# Patient Record
Sex: Female | Born: 1980 | Race: Black or African American | Hispanic: No | Marital: Single | Smoking: Never smoker
Health system: Southern US, Community
[De-identification: ages and names within clinical notes are randomized; demographics above are authoritative.]

## PROBLEM LIST (undated history)

## (undated) DIAGNOSIS — G5602 Carpal tunnel syndrome, left upper limb: Secondary | ICD-10-CM

## (undated) DIAGNOSIS — K219 Gastro-esophageal reflux disease without esophagitis: Secondary | ICD-10-CM

## (undated) DIAGNOSIS — R112 Nausea with vomiting, unspecified: Secondary | ICD-10-CM

## (undated) DIAGNOSIS — Z8261 Family history of arthritis: Secondary | ICD-10-CM

## (undated) DIAGNOSIS — H30139 Disseminated chorioretinal inflammation, generalized, unspecified eye: Secondary | ICD-10-CM

## (undated) DIAGNOSIS — T7840XA Allergy, unspecified, initial encounter: Secondary | ICD-10-CM

## (undated) DIAGNOSIS — G43909 Migraine, unspecified, not intractable, without status migrainosus: Secondary | ICD-10-CM

## (undated) DIAGNOSIS — Z9889 Other specified postprocedural states: Secondary | ICD-10-CM

## (undated) DIAGNOSIS — N946 Dysmenorrhea, unspecified: Secondary | ICD-10-CM

## (undated) DIAGNOSIS — H3500 Unspecified background retinopathy: Secondary | ICD-10-CM

## (undated) DIAGNOSIS — E559 Vitamin D deficiency, unspecified: Secondary | ICD-10-CM

## (undated) DIAGNOSIS — D8989 Other specified disorders involving the immune mechanism, not elsewhere classified: Secondary | ICD-10-CM

## (undated) HISTORY — DX: Allergy, unspecified, initial encounter: T78.40XA

## (undated) HISTORY — DX: Family history of arthritis: Z82.61

## (undated) HISTORY — DX: Other specified disorders involving the immune mechanism, not elsewhere classified: H35.00

## (undated) HISTORY — DX: Disseminated chorioretinal inflammation, generalized, unspecified eye: H30.139

## (undated) HISTORY — DX: Carpal tunnel syndrome, left upper limb: G56.02

## (undated) HISTORY — DX: Vitamin D deficiency, unspecified: E55.9

## (undated) HISTORY — DX: Other specified disorders involving the immune mechanism, not elsewhere classified: D89.89

## (undated) HISTORY — DX: Dysmenorrhea, unspecified: N94.6

---

## 2006-02-07 HISTORY — PX: TONSILLECTOMY: SUR1361

## 2011-01-21 ENCOUNTER — Encounter: Payer: Self-pay | Admitting: *Deleted

## 2011-01-21 ENCOUNTER — Emergency Department (HOSPITAL_COMMUNITY)
Admission: EM | Admit: 2011-01-21 | Discharge: 2011-01-21 | Disposition: A | Discharge status: Home / Self Care | Payer: Medicaid Other | Attending: Emergency Medicine | Admitting: Emergency Medicine

## 2011-01-21 DIAGNOSIS — H9209 Otalgia, unspecified ear: Secondary | ICD-10-CM | POA: Insufficient documentation

## 2011-01-21 DIAGNOSIS — M26629 Arthralgia of temporomandibular joint, unspecified side: Secondary | ICD-10-CM | POA: Insufficient documentation

## 2011-01-21 MED ORDER — IBUPROFEN 800 MG PO TABS: 800.0000 mg | ORAL_TABLET | Freq: Three times a day (TID) | ORAL | Status: AC

## 2011-01-21 MED ORDER — IBUPROFEN 800 MG PO TABS
800.0000 mg | ORAL_TABLET | Freq: Once | ORAL | Status: AC
  Administered 2011-01-21: 800 mg via ORAL
  Filled 2011-01-21: qty 1

## 2011-01-21 NOTE — ED Notes (Signed)
Onset one week ago left ear pain continued today 9/10 achy sharp able to hear without incident.

## 2011-01-21 NOTE — ED Provider Notes (Signed)
History     CSN: [id number] Arrival date & time: 01/21/2011  4:03 PM   First MD Initiated Contact with Patient 01/21/11 1610      Chief Complaint  Patient presents with  . Otalgia    (Consider location/radiation/quality/duration/timing/severity/associated sxs/prior treatment) HPI Comments: Patient presents complaining of one week of left ear pain.  No drainage.  No fevers.  No loss of hearing her initial injury or exposure to loud noises.  Patient does note that the pain increases with chewing.  It is also worse upon opening her jaw wide.  Patient has no sore throat and no difficulty swallowing.  She has not taken anything for her pain at this time.  She has no primary care physician in the area as of yet.  Patient is a 30 y.o. female presenting with ear pain. The history is provided by the patient. No language interpreter was used.  Otalgia This is a new problem. The current episode started more than 2 days ago. There is pain in the left ear. The problem occurs constantly. The problem has not changed since onset.There has been no fever. The pain is moderate. Pertinent negatives include no headaches, no abdominal pain, no diarrhea, no vomiting, no cough and no rash.    History reviewed. No pertinent past medical history.  History reviewed. No pertinent past surgical history.  History reviewed. No pertinent family history.  History  Substance Use Topics  . Smoking status: Never Smoker   . Smokeless tobacco: Not on file  . Alcohol Use: No    OB History    Grav Para Term Preterm Abortions TAB SAB Ect Mult Living                  Review of Systems  Constitutional: Negative.  Negative for fever and chills.  HENT: Positive for ear pain.   Eyes: Negative.  Negative for discharge and redness.  Respiratory: Negative.  Negative for cough and shortness of breath.   Cardiovascular: Negative.  Negative for chest pain.  Gastrointestinal: Negative.  Negative for nausea, vomiting,  abdominal pain and diarrhea.  Genitourinary: Negative.  Negative for dysuria and vaginal discharge.  Musculoskeletal: Negative.  Negative for back pain.  Skin: Negative.  Negative for color change and rash.  Neurological: Negative.  Negative for syncope and headaches.  Hematological: Negative.  Negative for adenopathy.  Psychiatric/Behavioral: Negative.  Negative for confusion.  All other systems reviewed and are negative.    Allergies  Review of patient's allergies indicates no known allergies.  Home Medications  No current outpatient prescriptions on file.  BP 131/79  Pulse 84  Temp(Src) 98.5 F (36.9 C) (Oral)  Resp 20  SpO2 98%  LMP 12/22/2010  Physical Exam  Constitutional: She is oriented to person, place, and time. She appears well-developed and well-nourished.  HENT:  Head: Normocephalic and atraumatic.  Left Ear: External ear normal.  Nose: Nose normal.  Mouth/Throat: Oropharynx is clear and moist. No oropharyngeal exudate.       Left TM is clear.  Left external ear canal is clear and without signs of injury or inflammation.  No swelling posterior to the ear.  No lymphadenopathy in her anterior posterior neck or pre-or postauricular regions.  Patient has a clear oropharynx.  Uvula is midline and there is no gum tenderness or dental pain on exam.  Patient's noted location of tenderness is over her left TMJ joint.  Eyes: Conjunctivae and EOM are normal. Pupils are equal, round, and reactive to light.  Neck: Normal range of motion. Neck supple.  Pulmonary/Chest: Effort normal.  Musculoskeletal: Normal range of motion.  Lymphadenopathy:    She has no cervical adenopathy.  Neurological: She is alert and oriented to person, place, and time.  Skin: Skin is warm and dry.  Psychiatric: She has a normal mood and affect. Her behavior is normal. Judgment and thought content normal.    ED Course  Procedures (including critical care time)  Labs Reviewed - No data to  display No results found.   No diagnosis found.    MDM  Patient with possible TMJ syndrome or inflammation.  Her ear shows no signs of otitis externa or otitis media.  Her evaluation of her gums and oral cavity does not show any signs of dental abscess or tonsillitis or pharyngitis.  No lymphadenopathy or swelling is noted in her neck.  No swelling behind her ear to indicate mastoiditis.  At this point, place the patient on anti-inflammatory medications and have her followup with an oral surgeon or a dentist for further evaluation if this is persistent.        Nat Christen, MD 01/21/11 [phone]

## 2011-01-21 NOTE — ED Notes (Signed)
Lt earache for one week no drainage no temp.  Her hearing is normal

## 2011-05-19 ENCOUNTER — Encounter (HOSPITAL_COMMUNITY): Payer: Self-pay | Admitting: Emergency Medicine

## 2011-05-19 ENCOUNTER — Emergency Department (HOSPITAL_COMMUNITY)
Admission: EM | Admit: 2011-05-19 | Discharge: 2011-05-20 | Disposition: A | Discharge status: Home / Self Care | Payer: Medicaid Other | Attending: Emergency Medicine | Admitting: Emergency Medicine

## 2011-05-19 DIAGNOSIS — L298 Other pruritus: Secondary | ICD-10-CM | POA: Insufficient documentation

## 2011-05-19 DIAGNOSIS — L299 Pruritus, unspecified: Secondary | ICD-10-CM

## 2011-05-19 DIAGNOSIS — L2989 Other pruritus: Secondary | ICD-10-CM | POA: Insufficient documentation

## 2011-05-19 NOTE — ED Notes (Signed)
Pt alert, nad, c/o itching all over, onset this evening, denies recent exposures, resp even unlabored, skin pwd, states took a benadryl pta

## 2011-05-20 MED ORDER — PREDNISONE 20 MG PO TABS
60.0000 mg | ORAL_TABLET | Freq: Once | ORAL | Status: AC
Start: 2011-05-20 — End: 2011-05-20
  Administered 2011-05-20: 60 mg via ORAL
  Filled 2011-05-20: qty 3

## 2011-05-20 MED ORDER — DIPHENHYDRAMINE HCL 25 MG PO TABS
25.0000 mg | ORAL_TABLET | Freq: Three times a day (TID) | ORAL | Status: DC | PRN
Start: 2011-05-20 — End: 2011-05-21

## 2011-05-20 MED ORDER — PREDNISONE 10 MG PO TABS: 20.0000 mg | ORAL_TABLET | Freq: Every day | ORAL | Status: DC

## 2011-05-20 MED ORDER — PREDNISONE 20 MG PO TABS: 60.0000 mg | ORAL_TABLET | Freq: Every day | ORAL | Status: AC

## 2011-05-20 MED ORDER — FAMOTIDINE 20 MG PO TABS: 20.0000 mg | ORAL_TABLET | Freq: Two times a day (BID) | ORAL | Status: DC

## 2011-05-20 MED ORDER — FAMOTIDINE 20 MG PO TABS
20.0000 mg | ORAL_TABLET | Freq: Once | ORAL | Status: AC
  Administered 2011-05-20: 20 mg via ORAL
  Filled 2011-05-20: qty 1

## 2011-05-20 NOTE — Discharge Instructions (Signed)
FOLLOW UP WITH YOUR CHOICE OF DERMATOLOGIST FOR PERSISTENT ITCHING OR RETURN HERE WITH ANY WORSENING SYMPTOMS OR NEW CONCERN.  Pruritus  Pruritis is an itch. There are many different problems that can cause an itch. Dry skin is one of the most common causes of itching. Most cases of itching do not require medical attention.  HOME CARE INSTRUCTIONS  Make sure your skin is moistened on a regular basis. A moisturizer that contains petroleum jelly is best for keeping moisture in your skin. If you develop a rash, you may try the following for relief:   Use corticosteroid cream.   Apply cool compresses to the affected areas.   Bathe with Epsom salts or baking soda in the bathwater.   Soak in colloidal oatmeal baths. These are available at your pharmacy.   Apply baking soda paste to the rash. Stir water into baking soda until it reaches a paste-like consistency.   Use an anti-itch lotion.   Take over-the-counter diphenhydramine medicine by mouth as the instructions direct.   Avoid scratching. Scratching may cause the rash to become infected. If itching is very bad, your caregiver may suggest prescription lotions or creams to lessen your symptoms.   Avoid hot showers, which can make itching worse. A cold shower may help with itching as long as you use a moisturizer after the shower.  SEEK MEDICAL CARE IF: The itching does not go away after several days. Document Released: 10/06/2010 Document Revised: 01/13/2011 Document Reviewed: 10/06/2010 [facility] Patient Information 2012 [place], [place].

## 2011-05-20 NOTE — ED Provider Notes (Signed)
History     CSN: [id number]  Arrival date & time 05/19/11  2240   First MD Initiated Contact with Patient 05/20/11 0116      Chief Complaint  Patient presents with  . Pruritis    (Consider location/radiation/quality/duration/timing/severity/associated sxs/prior treatment) Patient is a 31 y.o. female presenting with rash. The history is provided by the patient.  Rash  This is a new problem. The current episode started 3 to 5 hours ago. The problem has been rapidly improving. The problem is associated with nothing. There has been no fever. Associated symptoms comments: Generalized itching without rash, discoloration or swelling that started approximately 5 hours ago. No new soaps, foods, lotions, etc. She took Benadryl and symptoms started to subside about 3 hours later. No history of the same. . She has tried antihistamines for the symptoms. The treatment provided mild relief.    History reviewed. No pertinent past medical history.  History reviewed. No pertinent past surgical history.  No family history on file.  History  Substance Use Topics  . Smoking status: Never Smoker   . Smokeless tobacco: Not on file  . Alcohol Use: No    OB History    Grav Para Term Preterm Abortions TAB SAB Ect Mult Living                  Review of Systems  Constitutional: Negative for fever and chills.  Respiratory: Negative.   Skin: Positive for rash.    Allergies  Review of patient's allergies indicates no known allergies.  Home Medications  No current outpatient prescriptions on file.  BP 127/78  Pulse 89  Temp 98 F (36.7 C)  Resp 16  Wt 225 lb (102.059 kg)  SpO2 97%  LMP 04/18/2011  Physical Exam  Constitutional: She appears well-developed and well-nourished.  HENT:  Head: Normocephalic.  Mouth/Throat: Oropharynx is clear and moist.  Neck: Normal range of motion. Neck supple.  Pulmonary/Chest: Effort normal and breath sounds normal. She has no wheezes. She has no  rales.  Musculoskeletal: Normal range of motion.  Neurological: She is alert. No cranial nerve deficit.  Skin: Skin is warm and dry. No rash noted.       No rash, discoloration, lesion, or swelling to skin generally.   Psychiatric: She has a normal mood and affect.    ED Course  Procedures (including critical care time)  Labs Reviewed - No data to display No results found.   No diagnosis found. 1. Pruritis    MDM          Rodena Medin, PA-C 05/20/11 [phone]

## 2011-05-21 ENCOUNTER — Emergency Department (HOSPITAL_COMMUNITY)
Admission: EM | Admit: 2011-05-21 | Discharge: 2011-05-21 | Disposition: A | Discharge status: Home / Self Care | Payer: Medicaid Other | Source: Home / Self Care

## 2011-05-21 ENCOUNTER — Encounter (HOSPITAL_COMMUNITY): Payer: Self-pay | Admitting: Emergency Medicine

## 2011-05-21 DIAGNOSIS — K219 Gastro-esophageal reflux disease without esophagitis: Secondary | ICD-10-CM

## 2011-05-21 LAB — POCT URINALYSIS DIP (DEVICE)
Bilirubin Urine: NEGATIVE
Hgb urine dipstick: NEGATIVE
Ketones, ur: NEGATIVE mg/dL
Protein, ur: NEGATIVE mg/dL
pH: 6 (ref 5.0–8.0)

## 2011-05-21 MED ORDER — ONDANSETRON 4 MG PO TBDP: 4.0000 mg | ORAL_TABLET | Freq: Three times a day (TID) | ORAL | Status: AC | PRN

## 2011-05-21 MED ORDER — OMEPRAZOLE 20 MG PO CPDR: 20.0000 mg | DELAYED_RELEASE_CAPSULE | Freq: Every day | ORAL | Status: DC

## 2011-05-21 NOTE — ED Provider Notes (Signed)
Medical screening examination/treatment/procedure(s) were performed by non-physician practitioner and as supervising physician I was immediately available for consultation/collaboration.  Sunnie Nielsen, MD 05/21/11 [phone]

## 2011-05-21 NOTE — ED Provider Notes (Signed)
History     CSN: [id number]  Arrival date & time 05/21/11  1722   None     Chief Complaint  Patient presents with  . Nausea    (Consider location/radiation/quality/duration/timing/severity/associated sxs/prior treatment) HPI Comments: Patient presents today with complaints of nausea for 2 weeks. She denies abdominal pain, vomiting or diarrhea. She states that she has awoken a couple of times feeling like "stuff is in my throat, but nothing comes up." She has no previous history of heartburn symptoms or diagnosis of reflux. Patient was seen in the ED 2 days ago for hives, and dizziness that began after eating [place] food. She was diagnosed as having an allergic reaction and began on Pepcid and prednisone. She states that the itching and hives has resolved. She continues to have some dizziness with changing positions. She also feels that the nausea has worsened since she began the medication as prescribed by the ER.   History reviewed. No pertinent past medical history.  Past Surgical History  Procedure Date  . Tonsillectomy     History reviewed. No pertinent family history.  History  Substance Use Topics  . Smoking status: Never Smoker   . Smokeless tobacco: Not on file  . Alcohol Use: Yes    OB History    Grav Para Term Preterm Abortions TAB SAB Ect Mult Living                  Review of Systems  Constitutional: Negative for fever, chills, appetite change and fatigue.  HENT: Negative for ear pain, congestion, sore throat, rhinorrhea and sneezing.   Respiratory: Negative for cough, shortness of breath and wheezing.   Cardiovascular: Negative for chest pain and palpitations.  Gastrointestinal: Positive for nausea. Negative for vomiting, abdominal pain, diarrhea, constipation and blood in stool.  Genitourinary: Negative for dysuria, frequency, hematuria and flank pain.    Allergies  Review of patient's allergies indicates no known allergies.  Home Medications    Current Outpatient Rx  Name Route Sig Dispense Refill  . FAMOTIDINE 20 MG PO TABS Oral Take 1 tablet (20 mg total) by mouth 2 (two) times daily. 6 tablet 0  . OMEPRAZOLE 20 MG PO CPDR Oral Take 1 capsule (20 mg total) by mouth daily. 14 capsule 0  . ONDANSETRON 4 MG PO TBDP Oral Take 1 tablet (4 mg total) by mouth every 8 (eight) hours as needed for nausea. 12 tablet 0  . PREDNISONE 20 MG PO TABS Oral Take 3 tablets (60 mg total) by mouth daily. 9 tablet 0    BP 134/88  Pulse 65  Temp(Src) 98 F (36.7 C) (Oral)  Resp 16  SpO2 100%  LMP 04/18/2011  Physical Exam  Constitutional: She appears well-developed and well-nourished. No distress.  HENT:  Head: Normocephalic and atraumatic.  Right Ear: Tympanic membrane, external ear and ear canal normal.  Left Ear: Tympanic membrane, external ear and ear canal normal.  Nose: Nose normal.  Mouth/Throat: Uvula is midline, oropharynx is clear and moist and mucous membranes are normal. No oropharyngeal exudate, posterior oropharyngeal edema or posterior oropharyngeal erythema.  Eyes: Conjunctivae, EOM and lids are normal. Pupils are equal, round, and reactive to light.  Fundoscopic exam:      The right eye shows no hemorrhage and no papilledema.       The left eye shows no hemorrhage and no papilledema.  Neck: Neck supple.  Cardiovascular: Normal rate, regular rhythm and normal heart sounds.   Pulmonary/Chest: Effort normal and breath  sounds normal. No respiratory distress.  Abdominal: Soft. Normal appearance and bowel sounds are normal. She exhibits no mass. There is no hepatosplenomegaly. There is no tenderness. There is no CVA tenderness.  Lymphadenopathy:    She has no cervical adenopathy.  Neurological: She is alert. She has normal strength. No cranial nerve deficit. Gait normal.  Reflex Scores:      Bicep reflexes are 2+ on the right side and 2+ on the left side. Skin: Skin is warm and dry.  Psychiatric: She has a normal mood and  affect.    ED Course  Procedures (including critical care time)   Labs Reviewed  POCT URINALYSIS DIP (DEVICE)  POCT PREGNANCY, URINE  LAB REPORT - SCANNED   No results found.   1. GERD (gastroesophageal reflux disease)       MDM  Nausea x 2 weeks with nocturnal reflux symptoms. No abd pain, V/D. Recent allergic rxn improved.         Melody Comas, [place] 05/22/11 [phone]

## 2011-05-21 NOTE — ED Notes (Addendum)
Nausea for 2 weeks.  Reports burning, tingling sensation, itching onset this past Thursday. Generalized all over body.  Reports feeling off balance on Thursday.  Patient seen at [facility] long ed.  Currently itching has improved

## 2011-05-21 NOTE — Discharge Instructions (Signed)
Finish your last dose of Pepcid then begin taking Omeprazole. Increase fluids - drink 6-8 glasses of water a day. Follow up with your primary care dr next week if symptoms persist.   Gastroesophageal Reflux Disease, Adult Gastroesophageal reflux disease (GERD) happens when acid from your stomach goes into your food pipe (esophagus). The acid can cause a burning feeling in your chest. Over time, the acid can make small holes (ulcers) in your food pipe.  HOME CARE  Ask your doctor for advice about:   Losing weight.   Quitting smoking.   Alcohol use.   Avoid foods and drinks that make your problems worse. You may want to avoid:   Caffeine and alcohol.   Chocolate.   Mints.   Garlic and onions.   Spicy foods.   Citrus fruits, such as oranges, lemons, or limes.   Foods that contain tomato, such as sauce, chili, salsa, and pizza.   Fried and fatty foods.   Avoid lying down for 3 hours before you go to bed or before you take a nap.   Eat small meals often, instead of large meals.   Wear loose-fitting clothing. Do not wear anything tight around your waist.   Raise (elevate) the head of your bed 6 to 8 inches with wood blocks. Using extra pillows does not help.   Only take medicines as told by your doctor.   Do not take aspirin or ibuprofen.  GET HELP RIGHT AWAY IF:   You have pain in your arms, neck, jaw, teeth, or back.   Your pain gets worse or changes.   You feel sick to your stomach (nauseous), throw up (vomit), or sweat (diaphoresis).   You feel short of breath, or you pass out (faint).   Your throw up is green, yellow, black, or looks like coffee grounds or blood.   Your poop (stool) is red, bloody, or black.  MAKE SURE YOU:   Understand these instructions.   Will watch your condition.   Will get help right away if you are not doing well or get worse.  Document Released: 07/13/2007 Document Revised: 01/13/2011 Document Reviewed: 08/13/2010 [facility]  Patient Information 2012 [place], [place].

## 2011-05-23 NOTE — ED Provider Notes (Signed)
Medical screening examination/treatment/procedure(s) were performed by non-physician practitioner and as supervising physician I was immediately available for consultation/collaboration.  Leslee Home, M.D.   Reuben Likes, MD 05/23/11 [phone]

## 2011-08-05 DIAGNOSIS — K219 Gastro-esophageal reflux disease without esophagitis: Secondary | ICD-10-CM | POA: Insufficient documentation

## 2011-08-05 DIAGNOSIS — IMO0002 Reserved for concepts with insufficient information to code with codable children: Secondary | ICD-10-CM | POA: Insufficient documentation

## 2011-08-05 DIAGNOSIS — N946 Dysmenorrhea, unspecified: Secondary | ICD-10-CM | POA: Insufficient documentation

## 2011-09-05 DIAGNOSIS — E559 Vitamin D deficiency, unspecified: Secondary | ICD-10-CM | POA: Insufficient documentation

## 2011-10-25 DIAGNOSIS — A6000 Herpesviral infection of urogenital system, unspecified: Secondary | ICD-10-CM | POA: Insufficient documentation

## 2012-06-15 ENCOUNTER — Other Ambulatory Visit: Payer: Self-pay

## 2012-06-15 ENCOUNTER — Encounter (HOSPITAL_COMMUNITY): Payer: Self-pay | Admitting: Emergency Medicine

## 2012-06-15 ENCOUNTER — Emergency Department (HOSPITAL_COMMUNITY)
Admission: EM | Admit: 2012-06-15 | Discharge: 2012-06-15 | Disposition: A | Discharge status: Home / Self Care | Payer: 59 | Attending: Emergency Medicine | Admitting: Emergency Medicine

## 2012-06-15 DIAGNOSIS — R0789 Other chest pain: Secondary | ICD-10-CM

## 2012-06-15 DIAGNOSIS — G252 Other specified forms of tremor: Secondary | ICD-10-CM

## 2012-06-15 DIAGNOSIS — N390 Urinary tract infection, site not specified: Secondary | ICD-10-CM

## 2012-06-15 DIAGNOSIS — R51 Headache: Secondary | ICD-10-CM | POA: Insufficient documentation

## 2012-06-15 DIAGNOSIS — Z3202 Encounter for pregnancy test, result negative: Secondary | ICD-10-CM | POA: Insufficient documentation

## 2012-06-15 LAB — URINE MICROSCOPIC-ADD ON

## 2012-06-15 LAB — COMPREHENSIVE METABOLIC PANEL
Alkaline Phosphatase: 49 U/L (ref 39–117)
BUN: 14 mg/dL (ref 6–23)
Calcium: 8.9 mg/dL (ref 8.4–10.5)
Creatinine, Ser: 0.79 mg/dL (ref 0.50–1.10)
GFR calc Af Amer: >90 mL/min (ref >90)
Glucose, Bld: 102 mg/dL — ABNORMAL HIGH (ref 70–99)
Total Protein: 7.5 g/dL (ref 6.0–8.3)

## 2012-06-15 LAB — URINALYSIS, ROUTINE W REFLEX MICROSCOPIC
Ketones, ur: NEGATIVE mg/dL
Nitrite: NEGATIVE
Specific Gravity, Urine: 1.025 (ref 1.005–1.030)
pH: 6.5 (ref 5.0–8.0)

## 2012-06-15 LAB — CBC WITH DIFFERENTIAL/PLATELET
Eosinophils Absolute: 0.5 10*3/uL (ref 0.0–0.7)
Eosinophils Relative: 5 % (ref 0–5)
Hemoglobin: 12.9 g/dL (ref 12.0–15.0)
Lymphs Abs: 2.5 10*3/uL (ref 0.7–4.0)
MCH: 26 pg (ref 26.0–34.0)
MCHC: 33.5 g/dL (ref 30.0–36.0)
MCV: 77.6 fL — ABNORMAL LOW (ref 78.0–100.0)
Monocytes Absolute: 0.4 10*3/uL (ref 0.1–1.0)
Monocytes Relative: 4 % (ref 3–12)
RBC: 4.96 MIL/uL (ref 3.87–5.11)

## 2012-06-15 LAB — POCT PREGNANCY, URINE: Preg Test, Ur: NEGATIVE

## 2012-06-15 MED ORDER — DIPHENHYDRAMINE HCL 50 MG/ML IJ SOLN
25.0000 mg | Freq: Once | INTRAMUSCULAR | Status: AC
  Administered 2012-06-15: 25 mg via INTRAVENOUS
  Filled 2012-06-15: qty 1

## 2012-06-15 MED ORDER — SODIUM CHLORIDE 0.9 % IV BOLUS (SEPSIS)
1000.0000 mL | Freq: Once | INTRAVENOUS | Status: AC
  Administered 2012-06-15: 1000 mL via INTRAVENOUS

## 2012-06-15 MED ORDER — METOCLOPRAMIDE HCL 5 MG/ML IJ SOLN
10.0000 mg | Freq: Once | INTRAMUSCULAR | Status: AC
  Administered 2012-06-15: 10 mg via INTRAVENOUS
  Filled 2012-06-15: qty 2

## 2012-06-15 MED ORDER — DEXTROSE 5 % IV SOLN
1.0000 g | Freq: Once | INTRAVENOUS | Status: AC
  Administered 2012-06-15: 1 g via INTRAVENOUS
  Filled 2012-06-15: qty 10

## 2012-06-15 MED ORDER — HYDROCODONE-ACETAMINOPHEN 5-325 MG PO TABS: 1.0000 | ORAL_TABLET | ORAL | Status: DC | PRN

## 2012-06-15 MED ORDER — CEPHALEXIN 500 MG PO CAPS
500.0000 mg | ORAL_CAPSULE | Freq: Two times a day (BID) | ORAL | Status: DC
Start: 2012-06-15 — End: 2012-11-04

## 2012-06-15 MED ORDER — KETOROLAC TROMETHAMINE 30 MG/ML IJ SOLN
30.0000 mg | Freq: Once | INTRAMUSCULAR | Status: AC
  Administered 2012-06-15: 30 mg via INTRAVENOUS
  Filled 2012-06-15: qty 1

## 2012-06-15 NOTE — ED Notes (Signed)
Cp and trembling since 1115 this am left side cp  Some sob

## 2012-06-15 NOTE — ED Notes (Addendum)
Pt states she has had intermittent periods of whole body shaking since 2009. Pt states that she has seen several doctors for it, none can diagnose it. Pt states she is also having CP since this AM. PT c/o sob, nausea, denies vomiting. Denies diaphoresis. Pt states cp is sharp, starts in the center of chest "and jumps around."

## 2012-06-15 NOTE — ED Provider Notes (Signed)
History     CSN: [id number]  Arrival date & time 06/15/12  1411   First MD Initiated Contact with Patient 06/15/12 1516      Chief Complaint  Patient presents with  . Shaking  . Chest Pain    (Consider location/radiation/quality/duration/timing/severity/associated sxs/prior treatment) HPI  32 year old female presents emergency PERRLA chief complaint of chest pain and trembling.  Patient states that this has been going on on and off for the past 5 years since 2009.  She states that her first occurrence at happened after taking Claritin.  Patient states that last week she began having a headache and a spasming in her hands.  She also had an episode where her right side true of and became spastic.  She is unable to move that side and states she felt like she was "having a stroke."  The patient has been seen by neurology previously was ruled out for stroke or complex seizure.  The patient does state today that she has a headache which she describes as unilateral and throbbing.  She does have associated nausea.  She denies any auras or vomiting.  She rates her headache as a 9/10.  He did not take anything to relieve the pain.  She also has some associated sharp intermittent chest pain that is substernal and radiates to the left side.  Lasting seconds at a time.  She denies any radiation into the left arm or jaw, diaphoresis, chest pressure.  Does have some shortness of breath when the pain comes on.  She is a nonsmoker and other than obesity has no other risk factors for acute coronary syndrome.  Jill Alexanders denies any calf pain, leg swelling, exertion as estrogens, recent surgeries or confinement or hemoptysis. Denies unilateral weakness, facial asymmetry, difficulty with speech, change in gait, or vertigo. Denies photophobia, phonophobia,visual changes, stiff neck, neck pain, rash, or "thunderclap" onset of headache.     History reviewed. No pertinent past medical history.  Past Surgical History   Procedure Laterality Date  . Tonsillectomy      No family history on file.  History  Substance Use Topics  . Smoking status: Never Smoker   . Smokeless tobacco: Not on file  . Alcohol Use: Yes    OB History   Grav Para Term Preterm Abortions TAB SAB Ect Mult Living                  Review of Systems  Ten systems reviewed and are negative for acute change, except as noted in the HPI.   Allergies  Claritin  Home Medications  No current outpatient prescriptions on file.  BP 142/88  Pulse 102  Resp 16  SpO2 99%  Physical Exam  Physical Exam  Nursing note and vitals reviewed. Constitutional: She is oriented to person, place, and time. She appears well-developed and well-nourished. No distress.  HENT:  Head: Normocephalic and atraumatic.  Eyes: Conjunctivae normal and EOM are normal. Pupils are equal, round, and reactive to light. No scleral icterus.  Neck: Normal range of motion.  Cardiovascular: Normal rate, regular rhythm and normal heart sounds.  Exam reveals no gallop and no friction rub.   No murmur heard. Pulmonary/Chest: Effort normal and breath sounds normal. No respiratory distress.  Abdominal: Soft. Bowel sounds are normal. She exhibits no distension and no mass. There is no tenderness. There is no guarding.  Neurological:Tremulous. She is alert and oriented to person, place, and time. Speech is clear and goal oriented, follows commands. Major Cranial  nerves without deficit, no facial droop Normal strength in upper and lower extremities bilaterally including dorsiflexion and plantar flexion, strong and equal grip strength Sensation normal to light and sharp touch Moves extremities without ataxia, coordination intact Normal finger to nose and rapid alternating movements Neg romberg, no pronator drift Normal gait Normal heel-shin and balance Skin: Skin is warm and dry. She is not diaphoretic.     ED Course  Procedures (including critical care  time)  Labs Reviewed  CBC WITH DIFFERENTIAL - Abnormal; Notable for the following:    WBC 10.7 (*)    MCV 77.6 (*)    All other components within normal limits  COMPREHENSIVE METABOLIC PANEL - Abnormal; Notable for the following:    Glucose, Bld 102 (*)    Total Bilirubin 0.2 (*)    All other components within normal limits  URINALYSIS, ROUTINE W REFLEX MICROSCOPIC - Abnormal; Notable for the following:    APPearance TURBID (*)    Hgb urine dipstick LARGE (*)    Protein, ur 30 (*)    Leukocytes, UA SMALL (*)    All other components within normal limits  URINE MICROSCOPIC-ADD ON - Abnormal; Notable for the following:    Squamous Epithelial / LPF FEW (*)    Bacteria, UA MANY (*)    All other components within normal limits  URINE CULTURE  POCT I-STAT TROPONIN I  POCT PREGNANCY, URINE   No results found.  Date: 06/15/2012  Rate:99  Rhythm: normal sinus rhythm  QRS Axis: normal  Intervals: normal  ST/T Wave abnormalities: normal  Conduction Disutrbances: none  Narrative Interpretation:   Old EKG Reviewed:     1. Headache   2. UTI (lower urinary tract infection)   3. Coarse tremors   4. Atypical chest pain       MDM  Patient with atypical chest pain.   I suspect the patient may be having Atypical migraine considering her symptoms and the fact that her tremors always occur with headache which is UL and throbbing. I Have ordered a migraine cocktail. Labwork ordered at triage is pending.   4:55 PM EKG unremarkable. Leukocytosis and hyperglycemia likely acute phase reaction due to pain. Troponin negative  Patient headache is resolved. She does appear to have a urinary tract infection. I will treat here with rocephin. Sending urine for culture. I will d/c patient home and follow up with PCP/ Neurology. Pt HA treated and improved while in ED.  Presentation is like pts typical HA and non concerning for [facility], ICH, Meningitis, or temporal arteritis. Pt is afebrile with no  focal neuro deficits, nuchal rigidity, or change in vision. Pt is to follow up with PCP to discuss prophylactic medication. Pt verbalizes understanding and is agreeable with plan to dc.          Arthor Captain, PA-C 06/15/12 1718

## 2012-06-15 NOTE — ED Notes (Signed)
PA at bedside.

## 2012-06-16 LAB — URINE CULTURE: Colony Count: 55000

## 2012-06-16 NOTE — ED Provider Notes (Signed)
Medical screening examination/treatment/procedure(s) were performed by non-physician practitioner and as supervising physician I was immediately available for consultation/collaboration.   Benny Lennert, MD 06/16/12 1248

## 2012-11-04 ENCOUNTER — Encounter (HOSPITAL_COMMUNITY): Payer: Self-pay | Admitting: Emergency Medicine

## 2012-11-04 ENCOUNTER — Emergency Department (HOSPITAL_COMMUNITY)
Admission: EM | Admit: 2012-11-04 | Discharge: 2012-11-04 | Disposition: A | Discharge status: Home / Self Care | Payer: 59 | Attending: Emergency Medicine | Admitting: Emergency Medicine

## 2012-11-04 DIAGNOSIS — R5383 Other fatigue: Secondary | ICD-10-CM | POA: Insufficient documentation

## 2012-11-04 DIAGNOSIS — M542 Cervicalgia: Secondary | ICD-10-CM | POA: Insufficient documentation

## 2012-11-04 DIAGNOSIS — R51 Headache: Secondary | ICD-10-CM | POA: Insufficient documentation

## 2012-11-04 DIAGNOSIS — R259 Unspecified abnormal involuntary movements: Secondary | ICD-10-CM | POA: Insufficient documentation

## 2012-11-04 DIAGNOSIS — Z79899 Other long term (current) drug therapy: Secondary | ICD-10-CM | POA: Insufficient documentation

## 2012-11-04 DIAGNOSIS — R519 Headache, unspecified: Secondary | ICD-10-CM

## 2012-11-04 DIAGNOSIS — R5381 Other malaise: Secondary | ICD-10-CM | POA: Insufficient documentation

## 2012-11-04 MED ORDER — SODIUM CHLORIDE 0.9 % IV BOLUS (SEPSIS)
1000.0000 mL | Freq: Once | INTRAVENOUS | Status: AC
  Administered 2012-11-04: 1000 mL via INTRAVENOUS

## 2012-11-04 MED ORDER — DIPHENHYDRAMINE HCL 50 MG/ML IJ SOLN
25.0000 mg | Freq: Once | INTRAMUSCULAR | Status: AC
  Administered 2012-11-04: 25 mg via INTRAVENOUS
  Filled 2012-11-04: qty 1

## 2012-11-04 MED ORDER — PROCHLORPERAZINE EDISYLATE 5 MG/ML IJ SOLN
10.0000 mg | Freq: Four times a day (QID) | INTRAMUSCULAR | Status: DC | PRN
  Filled 2012-11-04: qty 2

## 2012-11-04 MED ORDER — METOCLOPRAMIDE HCL 5 MG/ML IJ SOLN
10.0000 mg | Freq: Once | INTRAMUSCULAR | Status: AC
  Administered 2012-11-04: 10 mg via INTRAVENOUS
  Filled 2012-11-04: qty 2

## 2012-11-04 MED ORDER — DEXAMETHASONE SODIUM PHOSPHATE 10 MG/ML IJ SOLN
10.0000 mg | Freq: Once | INTRAMUSCULAR | Status: AC
  Administered 2012-11-04: 10 mg via INTRAVENOUS
  Filled 2012-11-04: qty 1

## 2012-11-04 NOTE — ED Notes (Signed)
[facility] after having sudden onset of sharp pain and weakness on right side of body about 2pm today. No deficits noted via EMS. CBG 88.

## 2012-11-04 NOTE — ED Notes (Signed)
Pt taken to restroom by [employer]

## 2012-11-04 NOTE — ED Provider Notes (Signed)
CSN: [id number]     Arrival date & time 11/04/12  1450 History   First MD Initiated Contact with Patient 11/04/12 1532     Chief Complaint  Patient presents with  . Pain   (Consider location/radiation/quality/duration/timing/severity/associated sxs/prior Treatment) HPI Comments: 32 y/o female with no significant PMH presenting with headache. She reports prior to HA, she experienced sharp shooting pain from her neck down the right side of her body and became weak in her right arm and leg. HA gradual onset, throbbing, located on the top of her head. Also reports stiffness and tremors on the right side of her body. No recent infectious symptoms, chest pain, SOB. No vision changes, facial droop, difficulty speaking/swallowing. She reports that she has had right sided weakness in the past and had an MRI last week to evaluate. She is scheduled to f/u with her PCP tomorrow for results of the MRI.   The history is provided by the patient. No language interpreter was used.    History reviewed. No pertinent past medical history. Past Surgical History  Procedure Laterality Date  . Tonsillectomy     History reviewed. No pertinent family history. History  Substance Use Topics  . Smoking status: Never Smoker   . Smokeless tobacco: Not on file  . Alcohol Use: Yes   OB History   Grav Para Term Preterm Abortions TAB SAB Ect Mult Living                 Review of Systems  Constitutional: Negative for fever and appetite change.  HENT: Negative for hearing loss, ear pain, neck pain, neck stiffness and sinus pressure.   Eyes: Negative for photophobia and visual disturbance.  Respiratory: Negative for chest tightness and shortness of breath.   Cardiovascular: Negative for chest pain and palpitations.  Gastrointestinal: Negative for nausea and vomiting.  Musculoskeletal: Negative for gait problem.  Neurological: Positive for tremors, weakness and headaches. Negative for seizures, facial asymmetry,  speech difficulty and numbness.  All other systems reviewed and are negative.    Allergies  Claritin  Home Medications   Current Outpatient Rx  Name  Route  Sig  Dispense  Refill  . Cholecalciferol (VITAMIN D PO)   Oral   Take 1 tablet by mouth daily.         . Cyanocobalamin (B-12 PO)   Oral   Take 1 tablet by mouth daily.         . IRON PO   Oral   Take 1 tablet by mouth daily.          BP 134/85  Pulse 87  Temp(Src) 99.8 F (37.7 C) (Oral)  Ht 5\' 6"  (1.676 m)  Wt 236 lb (107.049 kg)  BMI 38.11 kg/m2  SpO2 100% Physical Exam  Vitals reviewed. Constitutional: She is oriented to person, place, and time. She appears well-developed and well-nourished. No distress.  HENT:  Head: Atraumatic.  Mouth/Throat: Oropharynx is clear and moist.  Eyes: EOM are normal. Pupils are equal, round, and reactive to light.  Neck: Normal range of motion. Neck supple.  Cardiovascular: Normal rate, regular rhythm, normal heart sounds and intact distal pulses.   Pulmonary/Chest: Effort normal and breath sounds normal.  Abdominal: Soft. Bowel sounds are normal. She exhibits no distension. There is no tenderness.  Neurological: She is alert and oriented to person, place, and time. She has normal reflexes. She displays tremor (right sided). No cranial nerve deficit or sensory deficit. She exhibits abnormal muscle tone (increased tone  of RUE and RLE). GCS eye subscore is 4. GCS verbal subscore is 5. GCS motor subscore is 6.  Strength 4/5 in right upper and lower extremity. 5/5 in left UE and LE. No truncal ataxia. No pronator drift.     ED Course  Procedures (including critical care time) Labs Review Labs Reviewed - No data to display Imaging Review No results found.  MDM   1. Headache    32 y/o female with no significant PMH presenting with headache and right sided weakness. Onset 2 pm. Reports prior episodes of similar symptoms for which she had an MRI last week and is to get  the results tomorrow at [facility]. On exam, she had increased tone of the RUE/RLE with 4/5 strength. Code Stroke not called as she has had similar symptoms in the past that were found to not be ischemic events. Following a migraine cocktail, strength and tone returned to baseline. No residual neurological deficits. HA resolved. In my differential I considered CVA, TIA, carotid dissection, partial seizure. Suspect complex migraine syndrome. As symptoms have now resolved and she has appropriate follow up, plan for discharge. Patient will keep scheduled appointment tomorrow. Return precautions discussed and patient voiced understanding of instructions.   Patient discussed with my attending, Dr. Jeraldine Loots.    Abagail Kitchens, MD 11/05/12 1410

## 2012-11-07 ENCOUNTER — Emergency Department (HOSPITAL_COMMUNITY)
Admission: EM | Admit: 2012-11-07 | Discharge: 2012-11-07 | Disposition: A | Discharge status: Home / Self Care | Payer: 59 | Attending: Emergency Medicine | Admitting: Emergency Medicine

## 2012-11-07 ENCOUNTER — Encounter (HOSPITAL_COMMUNITY): Payer: Self-pay | Admitting: Emergency Medicine

## 2012-11-07 DIAGNOSIS — R51 Headache: Secondary | ICD-10-CM | POA: Insufficient documentation

## 2012-11-07 DIAGNOSIS — H539 Unspecified visual disturbance: Secondary | ICD-10-CM | POA: Insufficient documentation

## 2012-11-07 DIAGNOSIS — Z79899 Other long term (current) drug therapy: Secondary | ICD-10-CM | POA: Insufficient documentation

## 2012-11-07 DIAGNOSIS — Z8679 Personal history of other diseases of the circulatory system: Secondary | ICD-10-CM | POA: Insufficient documentation

## 2012-11-07 DIAGNOSIS — H5710 Ocular pain, unspecified eye: Secondary | ICD-10-CM | POA: Insufficient documentation

## 2012-11-07 DIAGNOSIS — R42 Dizziness and giddiness: Secondary | ICD-10-CM | POA: Insufficient documentation

## 2012-11-07 DIAGNOSIS — R519 Headache, unspecified: Secondary | ICD-10-CM

## 2012-11-07 DIAGNOSIS — H5711 Ocular pain, right eye: Secondary | ICD-10-CM

## 2012-11-07 HISTORY — DX: Migraine, unspecified, not intractable, without status migrainosus: G43.909

## 2012-11-07 MED ORDER — METHYLPREDNISOLONE ACETATE 80 MG/ML IJ SUSP
80.0000 mg | Freq: Once | INTRAMUSCULAR | Status: AC
  Administered 2012-11-07: 80 mg via INTRAMUSCULAR
  Filled 2012-11-07: qty 1

## 2012-11-07 MED ORDER — BETAMETHASONE SOD PHOS & ACET 6 (3-3) MG/ML IJ SUSP: 12.0000 mg | Freq: Once | INTRAMUSCULAR | Status: DC

## 2012-11-07 MED ORDER — ACETAMINOPHEN 500 MG PO TABS
1000.0000 mg | ORAL_TABLET | Freq: Once | ORAL | Status: AC
  Administered 2012-11-07: 1000 mg via ORAL
  Filled 2012-11-07: qty 2

## 2012-11-07 MED ORDER — ONDANSETRON 4 MG PO TBDP
4.0000 mg | ORAL_TABLET | Freq: Once | ORAL | Status: AC
  Administered 2012-11-07: 4 mg via ORAL
  Filled 2012-11-07: qty 1

## 2012-11-07 NOTE — ED Notes (Signed)
Pt reports she was seen here Sunday with a headache and brief episode of Right side paralysis, pt was discharged home with a diagnoses of complex migraine. Pt followed up with her PCP on Monday and received an injection for pain control, pt reports she received the "migraine cocktail" hereon Sunday. Pt is waiting to see a neurologist and was instructed by her PCP to come here until she was able to do so. Pt is now c/o Right eye pain and a headache to the middle of her head, pt denies any paralysis since Sunday. Pt MAE with no difficulties, a/o x4, no facial droop or arm drift.

## 2012-11-07 NOTE — ED Provider Notes (Signed)
This patient was evaluated in conjunction with Dr. Ladona Ridgel, the resident physician.  I have seen the relevant studies, including ecg if performed.  The documentation is accurate and reflects this presentation.  Initially, the patient has headache, right-sided dysesthesia.  Notably the patient has had MRI recently, provisional diagnosis of complex migraine seems accurate.  Patient improved substantially here, and has followup scheduled for tomorrow, which seems appropriate.  Gerhard Munch, MD 11/07/12 [id number]

## 2012-11-07 NOTE — ED Notes (Signed)
Per terrance RN pt was discharged, instructions explained & patient will follow up. Teach back method used.

## 2012-11-07 NOTE — ED Provider Notes (Signed)
CSN: [id number]     Arrival date & time 11/07/12  1003 History   First MD Initiated Contact with Patient 11/07/12 1137     Chief Complaint  Patient presents with  . Eye Pain  . Headache   (Consider location/radiation/quality/duration/timing/severity/associated sxs/prior Treatment) Patient is a 32 y.o. female presenting with eye pain and headaches.  Eye Pain Associated symptoms include headaches. Pertinent negatives include no abdominal pain, arthralgias, chest pain, chills, fever, myalgias, nausea, rash or vomiting.  Headache Associated symptoms: eye pain   Associated symptoms: no abdominal pain, no diarrhea, no dizziness, no fever, no myalgias, no nausea and no vomiting     Dawn [name] is a 32 year old female with a past history of headache and visual deficits presents to the emergency department complaint.  The patient was seen here 3 days ago.  She reports that she turned her neck and had sudden onset of severe sharp electrical pain shooting from the back of her neck up to the center of her head and around the side of her head to her I associated with numbness and tingling which went down into the arm and caused weakness.. patient had an MRI done 2 weeks ago for the same problem which was negative.  Patient continues to have dull central headache, pain in the right eye, and blurry vision.  She was given a migraine cocktail and her last ED visit which did not relieve her symptoms at all.  The patient has a history of strokes in her grandparents she states that her grandfather had strokes beginning at an early age.  Patient's risk factors for stroke include family history and obesity. Past Medical History  Diagnosis Date  . Migraine    Past Surgical History  Procedure Laterality Date  . Tonsillectomy     No family history on file. History  Substance Use Topics  . Smoking status: Never Smoker   . Smokeless tobacco: Not on file  . Alcohol Use: Yes   OB History   Grav Para Term  Preterm Abortions TAB SAB Ect Mult Living                 Review of Systems  Constitutional: Negative for fever and chills.  HENT: Negative for trouble swallowing.   Eyes: Positive for pain and visual disturbance.  Respiratory: Negative for shortness of breath.   Cardiovascular: Negative for chest pain.  Gastrointestinal: Negative for nausea, vomiting, abdominal pain, diarrhea and constipation.  Genitourinary: Negative for dysuria and hematuria.  Musculoskeletal: Negative for myalgias and arthralgias.  Skin: Negative for rash.  Neurological: Positive for light-headedness and headaches. Negative for dizziness, syncope and speech difficulty.  All other systems reviewed and are negative.    Allergies  Claritin  Home Medications   Current Outpatient Rx  Name  Route  Sig  Dispense  Refill  . Cholecalciferol (VITAMIN D PO)   Oral   Take 1 tablet by mouth daily.         . Cyanocobalamin (B-12 PO)   Oral   Take 1 tablet by mouth daily.         . IRON PO   Oral   Take 1 tablet by mouth daily.          BP 120/74  Pulse 82  Temp(Src) 98.2 F (36.8 C)  Resp 16  SpO2 97% Physical Exam  Constitutional: She is oriented to person, place, and time. She appears well-developed and well-nourished. No distress.  HENT:  Head: Normocephalic and atraumatic.  Eyes: Conjunctivae are normal. No scleral icterus.  Neck: Normal range of motion.  Cardiovascular: Normal rate, regular rhythm and normal heart sounds.  Exam reveals no gallop and no friction rub.   No murmur heard. Pulmonary/Chest: Effort normal and breath sounds normal. No respiratory distress.  Abdominal: Soft. Bowel sounds are normal. She exhibits no distension and no mass. There is no tenderness. There is no guarding.  Neurological: She is alert and oriented to person, place, and time.  Patient is exquisitely tender to palpation at the left ear and greater occipital notch.  Palpation of the greater occipital nerve  causes shooting pain to the central scalp.  She says this is his same symptoms that began her headaches.  Speech is clear and goal oriented, follows commands Major Cranial nerves without deficit, no facial droop Normal strength in upper and lower extremities bilaterally including dorsiflexion and plantar flexion, strong and equal grip strength Sensation normal to light and sharp touch Moves extremities without ataxia, coordination intact Normal finger to nose and rapid alternating movements Neg romberg, no pronator drift Normal gait Normal heel-shin and balance   Skin: Skin is warm and dry. She is not diaphoretic.    ED Course  NERVE BLOCK Date/Time: 11/07/2012 3:12 PM Performed by: Arthor Captain Authorized by: Arthor Captain Consent: Verbal consent obtained. Risks and benefits: risks, benefits and alternatives were discussed Consent given by: patient Patient identity confirmed: verbally with patient Indications: pain relief Body area: head Nerve: greater occipital Laterality: right Patient sedated: no Patient position: sitting Needle gauge: 25 G Location technique: anatomical landmarks Local anesthetic: bupivacaine 0.5% with epinephrine (methyl prednisolone 20mg  (.25ml)) Anesthetic total: 0.25 ml Patient tolerance: Patient tolerated the procedure well with no immediate complications.  NERVE BLOCK Date/Time: 11/07/2012 3:14 PM Performed by: Arthor Captain Authorized by: Arthor Captain Consent: Verbal consent obtained. Consent given by: patient Patient identity confirmed: verbally with patient Indications: pain relief Body area: head Nerve: lesser occipital Laterality: right Patient sedated: no Patient position: sitting Needle gauge: 25 G Location technique: anatomical landmarks Local anesthetic: bupivacaine 0.25% with epinephrine (methyl prednisolone 20mg  (.25ml)) Anesthetic total: 0.25 ml Outcome: pain improved Patient tolerance: Patient tolerated the  procedure well with no immediate complications.   (including critical care time) Labs Review Labs Reviewed - No data to display Imaging Review No results found.  MDM   1. Headache   2. Eye pain, right    Patient with headache.  Visual disturbance.  She has had MRI which was negative for any acute abnormalities.  Her objective neurologic exam shows no abnormality.  I will try occipital nerve block to see if she has any relief of her symptoms.  paitent headache and eye pain resolved with nerve block and tylenol. She will follow up shortly with neuro. The patient appears reasonably screened and/or stabilized for discharge and I doubt any other medical condition or other [facility] requiring further screening, evaluation, or treatment in the ED at this time prior to discharge.   Arthor Captain, PA-C 11/11/12 2243

## 2012-11-07 NOTE — ED Notes (Signed)
Was seen on Sunday and dx w/ migrain and yesterday had a problem w/ rt eye blurry vision , still has h/a was given shot on Monday at dr office no new injury to head

## 2012-11-08 ENCOUNTER — Encounter: Payer: Self-pay | Admitting: Neurology

## 2012-11-08 ENCOUNTER — Ambulatory Visit (INDEPENDENT_AMBULATORY_CARE_PROVIDER_SITE_OTHER): Payer: 59 | Admitting: Neurology

## 2012-11-08 VITALS — BP 125/76 | HR 94 | Ht 66.0 in | Wt 229.0 lb

## 2012-11-08 DIAGNOSIS — G43109 Migraine with aura, not intractable, without status migrainosus: Secondary | ICD-10-CM

## 2012-11-08 DIAGNOSIS — R519 Headache, unspecified: Secondary | ICD-10-CM

## 2012-11-08 DIAGNOSIS — Z823 Family history of stroke: Secondary | ICD-10-CM

## 2012-11-08 DIAGNOSIS — Z862 Personal history of diseases of the blood and blood-forming organs and certain disorders involving the immune mechanism: Secondary | ICD-10-CM

## 2012-11-08 DIAGNOSIS — Z8249 Family history of ischemic heart disease and other diseases of the circulatory system: Secondary | ICD-10-CM

## 2012-11-08 DIAGNOSIS — R51 Headache: Secondary | ICD-10-CM

## 2012-11-08 MED ORDER — TOPIRAMATE 25 MG PO TABS: ORAL_TABLET | ORAL | Status: DC

## 2012-11-08 NOTE — Progress Notes (Signed)
Subjective:    Patient ID: Dawn [name] is a 32 y.o. female.  HPI  Huston Foley, MD, PhD [facility] [address] [address] [place], [place] [address]  Dear Dr. Lorenso Courier,  I saw your patient, Dawn [name], upon your kind request in my neurologic clinic today for initial consultation of her recurrent headaches. The patient is accompanied by her sister, Dawn [name], today. As you know, Dawn [name] is a very pleasant 32 year old right-handed woman with an underlying medical history of obesity, allergies and insomnia and prior diagnosis of migraine headaches who has been to the emergency room recently on several occasions with HA and complications, including eye pain and R sided tingling. She was seen on 11/07/2012 for sudden onset of headache and eye pain. She was seen 3 days prior to that as well. She recently had a noncontrast brain MRI on 10/24/2012 and I reviewed the test results. There was fluid within the left mastoid air cells representing an inflammatory periauricular process. She had a mucous retention cyst within the left maxillary sinus. No acute sinonasal disease was seen. Otherwise she acute findings.  On 11/07/2012 she was treated in the emergency room with occipital nerve block which helped per hospital record, but patient states it did not help. Prior to that she was treated with a migraine cocktail which did not help. She has seen a neurologist in the past for uncontrolable tremors, when she was still residing in [place]. She has a milder HA on the top of her head currently, describing it as a constant ache. She has mild nausea, no photophobia.  In saw a migraine specialist in 5/14 at [facility] and had an MRI brain, which was normal. She was treated with propranolol, which did not help and Inderal LA, which caused SEs.   Her Past Medical History Is Significant For: Past Medical History  Diagnosis Date  . Migraine    Her Past Surgical History Is Significant  For: Past Surgical History  Procedure Laterality Date  . Tonsillectomy     Her Family History Is Significant For: No family history on file.  Her Social History Is Significant For: History   Social History  . Marital Status: Single    Spouse Name: N/A    Number of Children: 1  . Years of Education: 12   Occupational History  . PROVIDER [employer]   Social History Main Topics  . Smoking status: Never Smoker   . Smokeless tobacco: Never Used  . Alcohol Use: No  . Drug Use: No  . Sexual Activity: None   Other Topics Concern  . None   Social History Narrative  . None    Her Allergies Are:  Allergies  Allergen Reactions  . Claritin [Loratadine] Other (See Comments)    Tremors  :   Her Current Medications Are:  Outpatient Encounter Prescriptions as of 11/08/2012  Medication Sig Dispense Refill  . Cholecalciferol (VITAMIN D PO) Take 1 tablet by mouth daily.      . Cyanocobalamin (B-12 PO) Take 1 tablet by mouth daily.      . IRON PO Take 1 tablet by mouth daily.      . Multiple Vitamins-Minerals (MULTIVITAMIN WITH MINERALS) tablet 1 tablet. Take 1 tablet by mouth daily.       No facility-administered encounter medications on file as of 11/08/2012.   Review of Systems:  Out of a complete 14 point review of systems, all are reviewed and negative with the exception  of these symptoms as listed below:  Review of Systems  Constitutional: Positive for fever.  HENT:       Ringing ears  Eyes:       Eye pain Double vision Loss of vision Blurred vision  Cardiovascular: Positive for chest pain.  Endocrine:       Feeling cold  Musculoskeletal:       Joint pain Cramps Aching muscles  Skin:       Birth marks  moles  Neurological: Positive for dizziness, weakness, numbness and headaches.  Hematological:       Anemia    Objective:  Neurologic Exam  Physical Exam Physical Examination:   Filed Vitals:   11/08/12 1330  BP: 125/76  Pulse: 94     General Examination: The patient is a very pleasant 32 y.o. female in no acute distress. She appears well-developed and well-nourished and well groomed. She is obese.   HEENT: Normocephalic, atraumatic, pupils are equal, round and reactive to light and accommodation. Funduscopic exam is normal with sharp disc margins noted. Extraocular tracking is good without limitation to gaze excursion or nystagmus noted. Normal smooth pursuit is noted. Hearing is grossly intact. Tympanic membranes are clear bilaterally. Face is symmetric with normal facial animation and normal facial sensation. Speech is clear with no dysarthria noted. There is no hypophonia. There is no lip, neck/head, jaw or voice tremor. Neck is supple with full range of passive and active motion. There are no carotid bruits on auscultation. Oropharynx exam reveals: mild mouth dryness, adequate dental hygiene and moderate airway crowding, due to larger tongue. Mallampati is class I. Tongue protrudes centrally and palate elevates symmetrically. Tonsils are 1+.   Chest: Clear to auscultation without wheezing, rhonchi or crackles noted.  Heart: S1+S2+0, regular and normal without murmurs, rubs or gallops noted.   Abdomen: Soft, non-tender and non-distended with normal bowel sounds appreciated on auscultation.  Extremities: There is no pitting edema in the distal lower extremities bilaterally. Pedal pulses are intact.  Skin: Warm and dry without trophic changes noted. There are no varicose veins.  Musculoskeletal: exam reveals no obvious joint deformities, tenderness or joint swelling or erythema.   Neurologically:  Mental status: The patient is awake, alert and oriented in all 4 spheres. Her memory, attention, language and knowledge are appropriate. There is no aphasia, agnosia, apraxia or anomia. Speech is clear with normal prosody and enunciation. Thought process is linear. Mood is congruent and affect is normal.  Cranial nerves are as  described above under HEENT exam. In addition, shoulder shrug is normal with equal shoulder height noted. Motor exam: Normal bulk, strength and tone is noted. There is no drift, tremor or rebound. Romberg is negative. Reflexes are 2+ throughout. Toes are downgoing bilaterally. Fine motor skills are intact with normal finger taps, normal hand movements, normal rapid alternating patting, normal foot taps and normal foot agility.  Cerebellar testing shows no dysmetria or intention tremor on finger to nose testing. Heel to shin is unremarkable bilaterally. There is no truncal or gait ataxia.  Sensory exam is intact to light touch, pinprick, vibration, temperature sense and proprioception in the upper and lower extremities.  Gait, station and balance are unremarkable. No veering to one side is noted. No leaning to one side is noted. Posture is age-appropriate and stance is narrow based. No problems turning are noted. She turns en bloc. Tandem walk is unremarkable. Intact toe and heel stance is noted.  Assessment and Plan:  In summary, Dawn [name] is a very pleasant 32 y.o.-year old female with an underlying medical history of headaches, whose history and exam are in keeping with a diagnosis of complicated migraine of several year duration. Her physical exam is non-focal. She is doing fairly well at this time and I reassured the patient in that regard. In particular, I did not see any papilledema. I had a long chat with the patient and her sister about my findings and the diagnosis of migraine with neurologic complications, the prognosis and treatment options. We talked about medical treatments and non-pharmacological approaches. We talked about smoking cessation and maintaining a healthy lifestyle in general. I encouraged the patient to eat healthy, exercise daily and keep well hydrated, to keep a scheduled bedtime and wake time routine, to not skip any meals and eat healthy snacks in between meals  and to have protein with every meal.   I advised the patient about common headache triggers: sleep deprivation, dehydration, overheating, stress, hypoglycemia or skipping meals and blood sugar fluctuations, excessive pain medications or excessive alcohol use or caffeine withdrawal. Some people have food triggers such as aged cheese, orange juice or chocolate, especially dark chocolate, or MSG (monosodium glutamate). She is to try to avoid these headache triggers as much possible. It may be helpful to keep a headache diary to figure out what makes Her headaches worse or brings them on and what alleviates them. Some people report headache onset after exercise but studies have shown that regular exercise may actually prevent headaches from coming. If She has exercise-induced headaches, She is advised to drink plenty of fluid before and after exercising and that to not overdo it and to not overheat. 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 She used to see a specialist in the [place] area and may need to establish care for this diagnosis with a pulmonologist. She is advised that I would hold off on spinal fluid testing at this moment based on no MRI evidence of white matter lesions and absence of papilledema in the context of a nonfocal exam.  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 Common side effects reported are: Sedation, sleepiness, tingling, change in taste especially with carbonated drinks, and rare side effects are glaucoma, kidney stones, and problems with thinking  including word finding difficulties. I advised the patient that the medication can lower the efficacy of hormonal birth control.  I answered all their questions today and the patient and her sister were in agreement with the above outlined plan. I would like to see the patient back in 3 months, sooner if the need arises and encouraged her to call with any interim questions, concerns, problems or updates and refill requests and test results.   Thank you very much for allowing me to participate in the care of this nice patient. If I can be of any further assistance to you please do not hesitate to call me at [phone].  Sincerely,   Huston Foley, MD, PhD

## 2012-11-08 NOTE — Patient Instructions (Addendum)
Please remember, common headache triggers are: sleep deprivation, dehydration, overheating, stress, hypoglycemia or skipping meals and blood sugar fluctuations, excessive pain medications or excessive alcohol use or caffeine withdrawal. Some people have food triggers such as aged cheese, orange juice or chocolate, especially dark chocolate, or MSG (monosodium glutamate). Try to avoid these headache triggers as much possible. It may be helpful to keep a headache diary to figure out what makes your headaches worse or brings them on and what alleviates them. Some people report headache onset after exercise but studies have shown that regular exercise may actually prevent headaches from coming. If you have exercise-induced headaches, please make sure that you drink plenty of fluid before and after exercising and that you do not over do it and do not overheat.  We will do a brain MRA and blood work, we will try Topamax 50 mg: Take half a pill each bedtime for one week, then one pill at bedtime daily for one week, then 1-1/2 pills at bedtime daily for one week, then 2 pills at bedtime daily thereafter. Common side effects reported are: Sedation, sleepiness, tingling, change in taste especially with carbonated drinks, and rare side effects are glaucoma, kidney stones, and problems with thinking including word finding difficulties.  I want you to see your ophthalmologist for your vision. Please call us with the name of your ophthalmologist.

## 2012-11-09 LAB — COMPREHENSIVE METABOLIC PANEL
ALT: 21 IU/L (ref 0–32)
AST: 19 IU/L (ref 0–40)
Albumin: 4.4 g/dL (ref 3.5–5.5)
Alkaline Phosphatase: 46 IU/L (ref 39–117)
BUN/Creatinine Ratio: 13 (ref 8–20)
BUN: 11 mg/dL (ref 6–20)
CO2: 23 mmol/L (ref 18–29)
Chloride: 100 mmol/L (ref 97–108)
GFR calc Af Amer: 108 mL/min/{1.73_m2} (ref >59)
Potassium: 3.6 mmol/L (ref 3.5–5.2)
Sodium: 140 mmol/L (ref 134–144)
Total Bilirubin: 0.4 mg/dL (ref 0.0–1.2)

## 2012-11-09 LAB — CBC WITH DIFFERENTIAL
Basophils Absolute: 0 10*3/uL (ref 0.0–0.2)
Eosinophils Absolute: 0.2 10*3/uL (ref 0.0–0.4)
Immature Granulocytes: 0 %
Lymphocytes Absolute: 3 10*3/uL (ref 0.7–3.1)
Lymphs: 29 %
MCHC: 32.7 g/dL (ref 31.5–35.7)
MCV: 80 fL (ref 79–97)
Monocytes: 5 %
Neutrophils Absolute: 6.3 10*3/uL (ref 1.4–7.0)
Platelets: 347 10*3/uL (ref 150–379)
RDW: 13.8 % (ref 12.3–15.4)
WBC: 10 10*3/uL (ref 3.4–10.8)

## 2012-11-09 LAB — ANA W/REFLEX: Anti Nuclear Antibody(ANA): NEGATIVE

## 2012-11-09 LAB — RPR: RPR: NONREACTIVE

## 2012-11-09 LAB — B12 AND FOLATE PANEL: Vitamin B-12: 274 pg/mL (ref 211–946)

## 2012-11-09 LAB — SEDIMENTATION RATE: Sed Rate: 22 mm/hr (ref 0–32)

## 2012-11-19 ENCOUNTER — Telehealth: Payer: Self-pay | Admitting: *Deleted

## 2012-11-19 NOTE — ED Provider Notes (Signed)
Medical screening examination/treatment/procedure(s) were performed by non-physician practitioner and as supervising physician I was immediately available for consultation/collaboration.  Carolie Mcilrath, MD 11/19/12 0850 

## 2012-11-19 NOTE — Telephone Encounter (Signed)
I meant to order an MRA head, not MRI brain, would that be covered?

## 2012-11-21 ENCOUNTER — Other Ambulatory Visit: Payer: 59

## 2012-11-23 ENCOUNTER — Encounter: Payer: Self-pay | Admitting: *Deleted

## 2012-11-23 NOTE — Progress Notes (Signed)
Quick Note:  Please call and advise the patient that the recent labs we checked were within normal limits. We checked vitamin B12 level, cell count, blood sugar, electrolytes, kidney function, liver function, thyroid function, inflammatory markers, sedimentation rate. No further action is required on these tests at this time. Please remind patient to keep any upcoming appointments or tests and to call us with any interim questions, concerns, problems or updates. Thanks,  Huston Foley, MD, PhD    ______

## 2012-11-26 ENCOUNTER — Other Ambulatory Visit: Payer: Self-pay | Admitting: Neurology

## 2012-11-26 DIAGNOSIS — R51 Headache: Secondary | ICD-10-CM

## 2012-11-26 DIAGNOSIS — Z862 Personal history of diseases of the blood and blood-forming organs and certain disorders involving the immune mechanism: Secondary | ICD-10-CM

## 2012-11-26 DIAGNOSIS — Z8249 Family history of ischemic heart disease and other diseases of the circulatory system: Secondary | ICD-10-CM

## 2012-11-26 DIAGNOSIS — G43109 Migraine with aura, not intractable, without status migrainosus: Secondary | ICD-10-CM

## 2012-11-26 NOTE — Progress Notes (Signed)
Quick Note:  I called pt and gave her the results of labs (normal). I did release to Mychart (she has [facility] my chart). Was not sure if would show. She verbalized understanding. ______

## 2012-11-29 ENCOUNTER — Other Ambulatory Visit: Payer: 59

## 2013-01-14 ENCOUNTER — Other Ambulatory Visit: Payer: Self-pay | Admitting: Obstetrics and Gynecology

## 2013-02-05 ENCOUNTER — Encounter (HOSPITAL_COMMUNITY): Payer: Self-pay | Admitting: Pharmacist

## 2013-02-12 ENCOUNTER — Other Ambulatory Visit: Payer: Self-pay | Admitting: Obstetrics and Gynecology

## 2013-02-14 ENCOUNTER — Ambulatory Visit: Payer: 59 | Admitting: Neurology

## 2013-02-17 MED ORDER — CEFAZOLIN SODIUM-DEXTROSE 2-3 GM-% IV SOLR
2.0000 g | INTRAVENOUS | Status: AC
  Administered 2013-02-18: 2 g via INTRAVENOUS

## 2013-02-18 ENCOUNTER — Encounter (HOSPITAL_COMMUNITY): Payer: Self-pay | Admitting: *Deleted

## 2013-02-18 ENCOUNTER — Ambulatory Visit (HOSPITAL_COMMUNITY)
Admission: RE | Admit: 2013-02-18 | Discharge: 2013-02-18 | Disposition: A | Discharge status: Home / Self Care | Payer: 59 | Source: Ambulatory Visit | Attending: Obstetrics and Gynecology | Admitting: Obstetrics and Gynecology

## 2013-02-18 ENCOUNTER — Encounter (HOSPITAL_COMMUNITY): Payer: 59 | Admitting: Anesthesiology

## 2013-02-18 ENCOUNTER — Encounter (HOSPITAL_COMMUNITY)
Admission: RE | Disposition: A | Discharge status: Home / Self Care | Payer: Self-pay | Source: Ambulatory Visit | Attending: Obstetrics and Gynecology

## 2013-02-18 ENCOUNTER — Ambulatory Visit (HOSPITAL_COMMUNITY): Payer: 59 | Admitting: Anesthesiology

## 2013-02-18 DIAGNOSIS — N831 Corpus luteum cyst of ovary, unspecified side: Secondary | ICD-10-CM | POA: Insufficient documentation

## 2013-02-18 DIAGNOSIS — N803 Endometriosis of pelvic peritoneum, unspecified: Secondary | ICD-10-CM | POA: Insufficient documentation

## 2013-02-18 DIAGNOSIS — N949 Unspecified condition associated with female genital organs and menstrual cycle: Secondary | ICD-10-CM | POA: Insufficient documentation

## 2013-02-18 DIAGNOSIS — N809 Endometriosis, unspecified: Secondary | ICD-10-CM

## 2013-02-18 DIAGNOSIS — G8929 Other chronic pain: Secondary | ICD-10-CM | POA: Insufficient documentation

## 2013-02-18 DIAGNOSIS — R51 Headache: Secondary | ICD-10-CM | POA: Insufficient documentation

## 2013-02-18 HISTORY — PX: LAPAROSCOPY: SHX197

## 2013-02-18 HISTORY — PX: YAG LASER APPLICATION: SHX6189

## 2013-02-18 LAB — CBC
HCT: 38.2 % (ref 36.0–46.0)
Hemoglobin: 12.5 g/dL (ref 12.0–15.0)
MCH: 25.6 pg — ABNORMAL LOW (ref 26.0–34.0)
MCHC: 32.7 g/dL (ref 30.0–36.0)
MCV: 78.1 fL (ref 78.0–100.0)
Platelets: 302 10*3/uL (ref 150–400)
RBC: 4.89 MIL/uL (ref 3.87–5.11)
RDW: 13.1 % (ref 11.5–15.5)
WBC: 8.4 10*3/uL (ref 4.0–10.5)

## 2013-02-18 LAB — PREGNANCY, URINE: PREG TEST UR: NEGATIVE

## 2013-02-18 SURGERY — LAPAROSCOPY OPERATIVE
Anesthesia: General | Site: Abdomen

## 2013-02-18 MED ORDER — NEOSTIGMINE METHYLSULFATE 1 MG/ML IJ SOLN
INTRAMUSCULAR | Status: DC | PRN
  Administered 2013-02-18: 3 mg via INTRAVENOUS

## 2013-02-18 MED ORDER — FENTANYL CITRATE 0.05 MG/ML IJ SOLN
INTRAMUSCULAR | Status: AC
  Filled 2013-02-18: qty 5

## 2013-02-18 MED ORDER — ROCURONIUM BROMIDE 100 MG/10ML IV SOLN
INTRAVENOUS | Status: DC | PRN
  Administered 2013-02-18: 30 mg via INTRAVENOUS
  Administered 2013-02-18: 10 mg via INTRAVENOUS

## 2013-02-18 MED ORDER — DEXAMETHASONE SODIUM PHOSPHATE 4 MG/ML IJ SOLN
INTRAMUSCULAR | Status: DC | PRN
  Administered 2013-02-18: 10 mg via INTRAVENOUS

## 2013-02-18 MED ORDER — MEPERIDINE HCL 25 MG/ML IJ SOLN
INTRAMUSCULAR | Status: AC
  Administered 2013-02-18: 12.5 mg via INTRAVENOUS
  Filled 2013-02-18: qty 1

## 2013-02-18 MED ORDER — LIDOCAINE HCL (CARDIAC) 20 MG/ML IV SOLN
INTRAVENOUS | Status: AC
  Filled 2013-02-18: qty 5

## 2013-02-18 MED ORDER — MIDAZOLAM HCL 2 MG/2ML IJ SOLN
INTRAMUSCULAR | Status: AC
  Filled 2013-02-18: qty 2

## 2013-02-18 MED ORDER — GLYCOPYRROLATE 0.2 MG/ML IJ SOLN
INTRAMUSCULAR | Status: DC | PRN
  Administered 2013-02-18: 0.6 mg via INTRAVENOUS

## 2013-02-18 MED ORDER — MIDAZOLAM HCL 2 MG/2ML IJ SOLN
INTRAMUSCULAR | Status: DC | PRN
  Administered 2013-02-18: 2 mg via INTRAVENOUS

## 2013-02-18 MED ORDER — FENTANYL CITRATE 0.05 MG/ML IJ SOLN
25.0000 ug | INTRAMUSCULAR | Status: DC | PRN
  Administered 2013-02-18 (×2): 50 ug via INTRAVENOUS

## 2013-02-18 MED ORDER — KETOROLAC TROMETHAMINE 30 MG/ML IJ SOLN
INTRAMUSCULAR | Status: DC | PRN
  Administered 2013-02-18: 30 mg via INTRAVENOUS

## 2013-02-18 MED ORDER — ROCURONIUM BROMIDE 100 MG/10ML IV SOLN
INTRAVENOUS | Status: AC
  Filled 2013-02-18: qty 1

## 2013-02-18 MED ORDER — FENTANYL CITRATE 0.05 MG/ML IJ SOLN
INTRAMUSCULAR | Status: AC
  Administered 2013-02-18: 50 ug via INTRAVENOUS
  Filled 2013-02-18: qty 2

## 2013-02-18 MED ORDER — PROPOFOL 10 MG/ML IV BOLUS
INTRAVENOUS | Status: DC | PRN
  Administered 2013-02-18: 200 mg via INTRAVENOUS

## 2013-02-18 MED ORDER — KETOROLAC TROMETHAMINE 30 MG/ML IJ SOLN: 15.0000 mg | Freq: Once | INTRAMUSCULAR | Status: DC | PRN

## 2013-02-18 MED ORDER — ONDANSETRON HCL 4 MG/2ML IJ SOLN
INTRAMUSCULAR | Status: AC
  Filled 2013-02-18: qty 2

## 2013-02-18 MED ORDER — OXYCODONE-ACETAMINOPHEN 5-325 MG PO TABS
1.0000 | ORAL_TABLET | ORAL | Status: DC | PRN
  Administered 2013-02-18: 1 via ORAL

## 2013-02-18 MED ORDER — LACTATED RINGERS IV SOLN
INTRAVENOUS | Status: DC
  Administered 2013-02-18 (×2): via INTRAVENOUS

## 2013-02-18 MED ORDER — OXYCODONE-ACETAMINOPHEN 5-325 MG PO TABS
ORAL_TABLET | ORAL | Status: AC
  Administered 2013-02-18: 1 via ORAL
  Filled 2013-02-18: qty 1

## 2013-02-18 MED ORDER — MIDAZOLAM HCL 2 MG/2ML IJ SOLN: 0.5000 mg | Freq: Once | INTRAMUSCULAR | Status: DC | PRN

## 2013-02-18 MED ORDER — MEPERIDINE HCL 25 MG/ML IJ SOLN
6.2500 mg | INTRAMUSCULAR | Status: DC | PRN
  Administered 2013-02-18: 12.5 mg via INTRAVENOUS

## 2013-02-18 MED ORDER — CEFAZOLIN SODIUM-DEXTROSE 2-3 GM-% IV SOLR
INTRAVENOUS | Status: DC
Start: 2013-02-18 — End: 2013-02-18
  Filled 2013-02-18: qty 50

## 2013-02-18 MED ORDER — BUPIVACAINE HCL (PF) 0.25 % IJ SOLN
INTRAMUSCULAR | Status: AC
  Filled 2013-02-18: qty 30

## 2013-02-18 MED ORDER — LIDOCAINE HCL (CARDIAC) 20 MG/ML IV SOLN
INTRAVENOUS | Status: DC | PRN
  Administered 2013-02-18: 60 mg via INTRAVENOUS

## 2013-02-18 MED ORDER — HEPARIN SODIUM (PORCINE) 5000 UNIT/ML IJ SOLN
INTRAMUSCULAR | Status: AC
  Filled 2013-02-18: qty 1

## 2013-02-18 MED ORDER — PROMETHAZINE HCL 25 MG/ML IJ SOLN: 6.2500 mg | INTRAMUSCULAR | Status: DC | PRN

## 2013-02-18 MED ORDER — BUPIVACAINE HCL (PF) 0.25 % IJ SOLN
INTRAMUSCULAR | Status: DC | PRN
  Administered 2013-02-18: 6 mL

## 2013-02-18 MED ORDER — SODIUM CHLORIDE 0.9 % IJ SOLN
INTRAMUSCULAR | Status: AC
  Filled 2013-02-18: qty 50

## 2013-02-18 MED ORDER — VASOPRESSIN 20 UNIT/ML IJ SOLN
INTRAMUSCULAR | Status: AC
  Filled 2013-02-18: qty 1

## 2013-02-18 MED ORDER — DEXAMETHASONE SODIUM PHOSPHATE 10 MG/ML IJ SOLN
INTRAMUSCULAR | Status: AC
  Filled 2013-02-18: qty 1

## 2013-02-18 MED ORDER — OXYCODONE-ACETAMINOPHEN 10-325 MG PO TABS: 1.0000 | ORAL_TABLET | ORAL | Status: DC | PRN

## 2013-02-18 MED ORDER — KETOROLAC TROMETHAMINE 30 MG/ML IJ SOLN
INTRAMUSCULAR | Status: AC
  Filled 2013-02-18: qty 1

## 2013-02-18 MED ORDER — ONDANSETRON HCL 4 MG/2ML IJ SOLN
INTRAMUSCULAR | Status: DC | PRN
  Administered 2013-02-18: 4 mg via INTRAVENOUS

## 2013-02-18 MED ORDER — FENTANYL CITRATE 0.05 MG/ML IJ SOLN
INTRAMUSCULAR | Status: DC | PRN
  Administered 2013-02-18 (×2): 50 ug via INTRAVENOUS
  Administered 2013-02-18: 100 ug via INTRAVENOUS
  Administered 2013-02-18 (×2): 50 ug via INTRAVENOUS

## 2013-02-18 MED ORDER — PROPOFOL 10 MG/ML IV EMUL
INTRAVENOUS | Status: AC
  Filled 2013-02-18: qty 20

## 2013-02-18 SURGICAL SUPPLY — 24 items
CATH ROBINSON RED A/P 16FR (CATHETERS) ×3 IMPLANT
CLOTH BEACON ORANGE TIMEOUT ST (SAFETY) ×3 IMPLANT
DERMABOND ADVANCED (GAUZE/BANDAGES/DRESSINGS) ×1
DERMABOND ADVANCED .7 DNX12 (GAUZE/BANDAGES/DRESSINGS) ×2 IMPLANT
EVACUATOR SMOKE 8.L (FILTER) IMPLANT
FORCEPS CUTTING 33CM 5MM (CUTTING FORCEPS) IMPLANT
GAS CARTRIDGE (MEDICAL GASES) IMPLANT
GLOVE ECLIPSE 7.0 STRL STRAW (GLOVE) ×6 IMPLANT
GOWN PREVENTION PLUS LG XLONG (DISPOSABLE) ×3 IMPLANT
GOWN PREVENTION PLUS XLARGE (GOWN DISPOSABLE) ×3 IMPLANT
LAPAROSCOPY HANDPIECE LONG (MISCELLANEOUS) ×3 IMPLANT
NS IRRIG 1000ML POUR BTL (IV SOLUTION) ×3 IMPLANT
PACK LAPAROSCOPY BASIN (CUSTOM PROCEDURE TRAY) ×3 IMPLANT
POUCH SPECIMEN RETRIEVAL 10MM (ENDOMECHANICALS) IMPLANT
PROTECTOR NERVE ULNAR (MISCELLANEOUS) ×3 IMPLANT
SEALER TISSUE G2 CVD JAW 45CM (ENDOMECHANICALS) IMPLANT
SET IRRIG TUBING LAPAROSCOPIC (IRRIGATION / IRRIGATOR) IMPLANT
SUT VICRYL 0 UR6 27IN ABS (SUTURE) IMPLANT
SUT VICRYL RAPIDE 3 0 (SUTURE) ×3 IMPLANT
TOWEL OR 17X24 6PK STRL BLUE (TOWEL DISPOSABLE) ×6 IMPLANT
TROCAR BALLN 12MMX100 BLUNT (TROCAR) ×3 IMPLANT
TROCAR XCEL NON-BLD 5MMX100MML (ENDOMECHANICALS) ×6 IMPLANT
WARMER LAPAROSCOPE (MISCELLANEOUS) ×3 IMPLANT
WATER STERILE IRR 1000ML POUR (IV SOLUTION) ×3 IMPLANT

## 2013-02-18 NOTE — H&P (Signed)
Dawn SprinklesBetty Stemen is an 33 y.o. black female who presents to the OR for a Dx Scope for recurrent Pelvic pain. The pain seems to worsen prior to menses and resolves with menses. She had improvement with OCPs. She declined lupron. She has no dyspareunia.   Chief Complaint: HPI:  Past Medical History  Diagnosis Date  . Migraine   . Recurrent headache 11/08/2012  . Vaginal delivery 2004    Past Surgical History  Procedure Laterality Date  . Tonsillectomy    . Tonsillectomy  2008    No family history on file. Social History:  reports that she has never smoked. She has never used smokeless tobacco. She reports that she does not drink alcohol or use illicit drugs.  Allergies:  Allergies  Allergen Reactions  . Claritin [Loratadine] Other (See Comments)    Tremors    Medications Prior to Admission  Medication Sig Dispense Refill  . cholecalciferol (VITAMIN D) 1000 UNITS tablet Take 2,000 Units by mouth daily.      . Cyanocobalamin (B-12 PO) Take 1 tablet by mouth daily.      . IRON PO Take 1 tablet by mouth daily.      . Meclizine HCl (BONINE PO) Take 1 tablet by mouth as needed (nausea).      . Multiple Vitamins-Minerals (MULTIVITAMIN WITH MINERALS) tablet 1 tablet. Take 1 tablet by mouth daily.      . naproxen sodium (ANAPROX) 220 MG tablet Take 220 mg by mouth 2 (two) times daily as needed (pain).          Blood pressure 129/74, pulse 80, temperature 98.2 F (36.8 C), temperature source Oral, resp. rate 16, height 5\' 6"  (1.676 m), weight 106.142 kg (234 lb), last menstrual period 01/21/2013, SpO2 99.00%. BP 129/74  Pulse 80  Temp(Src) 98.2 F (36.8 C) (Oral)  Resp 16  Ht 5\' 6"  (1.676 m)  Wt 106.142 kg (234 lb)  BMI 37.79 kg/m2  SpO2 99%  LMP 01/21/2013 Lungs: clear to auscultation bilaterally Abdomen: soft, non-tender; bowel sounds normal; no masses,  no organomegaly Pelvic: cervix normal in appearance, external genitalia normal, no adnexal masses or tenderness, no cervical  motion tenderness, rectovaginal septum normal, uterus normal size, shape, and consistency and vagina normal without discharge   Lab Results  Component Value Date   WBC 8.4 02/18/2013   HGB 12.5 02/18/2013   HCT 38.2 02/18/2013   MCV 78.1 02/18/2013   PLT 302 02/18/2013   Lab Results  Component Value Date   PREGTESTUR NEGATIVE 02/18/2013     Assessment/Plan Proceed with Ablation  Patient Active Problem List   Diagnosis Date Noted  . Recurrent headache 11/08/2012   Chronic recurrent pelvic pain, possible endometriosis.  ANDERSON,MARK E 02/18/2013, 12:02 PM

## 2013-02-18 NOTE — Discharge Instructions (Signed)

## 2013-02-18 NOTE — Transfer of Care (Signed)
Immediate Anesthesia Transfer of Care Note  Patient: Dawn SprinklesBetty [name]  Procedure(s) Performed: Procedure(s) with comments: LAPAROSCOPY OPERATIVE (N/A) - YAG LASER YAG LASER APPLICATION (N/A)  Patient Location: PACU  Anesthesia Type:General  Level of Consciousness: awake, alert  and patient cooperative  Airway & Oxygen Therapy: Patient Spontanous Breathing and Patient connected to nasal cannula oxygen  Post-op Assessment: Report given to PACU RN and Post -op Vital signs reviewed and stable  Post vital signs: stable  Complications: No apparent anesthesia complications

## 2013-02-18 NOTE — Anesthesia Postprocedure Evaluation (Signed)
Anesthesia Post Note  Patient: Dawn SprinklesBetty Lugar  Procedure(s) Performed: Procedure(s) (LRB): LAPAROSCOPY OPERATIVE (N/A) YAG LASER APPLICATION (N/A)  Anesthesia type: General  Patient location: PACU  Post pain: Pain level controlled  Post assessment: Post-op Vital signs reviewed  Last Vitals:  Filed Vitals:   02/18/13 1054  BP: 129/74  Pulse: 80  Temp: 36.8 C  Resp: 16    Post vital signs: Reviewed  Level of consciousness: sedated  Complications: No apparent anesthesia complications

## 2013-02-18 NOTE — Anesthesia Preprocedure Evaluation (Signed)
Anesthesia Evaluation  Patient identified by MRN, date of birth, ID band Patient awake    Reviewed: Allergy & Precautions, H&P , Patient's Chart, lab work & pertinent test results, reviewed documented beta blocker date and time   History of Anesthesia Complications Negative for: history of anesthetic complications  Airway Mallampati: II TM Distance: >3 FB Neck ROM: full    Dental   Pulmonary  breath sounds clear to auscultation        Cardiovascular Exercise Tolerance: Good Rhythm:regular Rate:Normal     Neuro/Psych  Headaches,    GI/Hepatic   Endo/Other    Renal/GU      Musculoskeletal   Abdominal   Peds  Hematology   Anesthesia Other Findings   Reproductive/Obstetrics                           Anesthesia Physical Anesthesia Plan  ASA: II  Anesthesia Plan: General ETT   Post-op Pain Management:    Induction:   Airway Management Planned:   Additional Equipment:   Intra-op Plan:   Post-operative Plan:   Informed Consent: I have reviewed the patients History and Physical, chart, labs and discussed the procedure including the risks, benefits and alternatives for the proposed anesthesia with the patient or authorized representative who has indicated his/her understanding and acceptance.   Dental Advisory Given  Plan Discussed with: CRNA and Surgeon  Anesthesia Plan Comments:         Anesthesia Quick Evaluation

## 2013-02-19 ENCOUNTER — Encounter (HOSPITAL_COMMUNITY): Payer: Self-pay | Admitting: Obstetrics and Gynecology

## 2013-02-19 NOTE — Op Note (Signed)
[name]:  [name], Dawn [name]                 ACCOUNT NO.:  [id number]  MEDICAL RECORD NO.:  [id number]  LOCATION:  [facility]                          FACILITY:  [facility]  PHYSICIAN:  Malva LimesMark Jasaun Carn, M.D.    DATE OF BIRTH:  1981/02/01  DATE OF PROCEDURE:  02/18/2013 DATE OF DISCHARGE:  02/18/2013                              OPERATIVE REPORT   PREOPERATIVE DIAGNOSIS:  Chronic pelvic pain.  POSTOPERATIVE DIAGNOSIS:  Minimal endometriosis.  PROCEDURE: 1. Diagnostic laparoscopy. 2. Cauterization of endometriosis.  SURGEON:  Malva LimesMark Katryn Plummer, M.D.  ASSISTANT:  Luvenia ReddenW. Scott Bowie, MD  ANESTHESIA:  General and local.  ANTIBIOTICS:  Ancef 2 g.  DRAINS:  Red rubber catheter bladder.  SPECIMENS:  None.  COMPLICATIONS:  None.  FINDINGS:  The patient had a normal appearing liver. Gallbladder was not visualized.  The patient had a normal appearing appendix, however, the entire appendix cannot be visualized because it was retrocecal.  The patient had normal ovaries bilaterally.  There was a corpus luteum cyst on the left ovary.  Fallopian tubes were normal bilaterally.  On the left anterior cul-de-sac, the patient had multiple implants of endometriosis.  There was also minimal endometriosis on the fundus of the uterus.  There was endometriosis on both uterosacral ligaments. There was no significant pelvic adhesions.  PROCEDURE:  The patient was taken to the operating room, where a general anesthetic was administered without difficulty.  She was placed in dorsal lithotomy position.  She was prepped and draped in the usual fashion for this procedure.  Her bladder was drained with a red rubber catheter.  A Hulka tenaculum was applied to the anterior cervical lip. Her umbilicus was injected with 0.25% Marcaine.  A vertical skin incision was made.  The fascia was grasped and entered with Mayo scissors.  The parietal peritoneum was grasped and entered with the Metzenbaum scissors.  The Hasson cannula was placed  into the abdominal cavity and 3 L of carbon dioxide was insufflated.  The patient was then placed in Trendelenburg. A 5-mm port was placed under direct visualization in the patient's right lower quadrant.  Findings were as noted above.  At this point, Kleppinger forceps was used to cauterize all areas noted above the contained endometriosis. Copious irrigation was performed.  There was no other evidence of endometriosis discovered. At this point, the pneumoperitoneum was released.  Instruments removed. The fascia was closed with 0 Vicryl suture and the skin with Dermabond.  The patient was awoken and taken recovery room in stable condition.  Instrument and lap counts correct x2.  The Hulka tenaculum was removed.  The patient was discharged to home with Percocet.          ______________________________ Malva LimesMark Quintana Canelo, M.D.     MA/MEDQ  D:  02/18/2013  T:  02/19/2013  Job:  [id number]

## 2014-01-20 ENCOUNTER — Emergency Department (HOSPITAL_COMMUNITY)
Admission: EM | Admit: 2014-01-20 | Discharge: 2014-01-20 | Disposition: A | Discharge status: Home / Self Care | Payer: 59 | Attending: Emergency Medicine | Admitting: Emergency Medicine

## 2014-01-20 ENCOUNTER — Encounter (HOSPITAL_COMMUNITY): Payer: Self-pay | Admitting: Emergency Medicine

## 2014-01-20 DIAGNOSIS — M542 Cervicalgia: Secondary | ICD-10-CM | POA: Diagnosis present

## 2014-01-20 DIAGNOSIS — R0981 Nasal congestion: Secondary | ICD-10-CM | POA: Insufficient documentation

## 2014-01-20 DIAGNOSIS — Z79899 Other long term (current) drug therapy: Secondary | ICD-10-CM | POA: Diagnosis not present

## 2014-01-20 DIAGNOSIS — I889 Nonspecific lymphadenitis, unspecified: Secondary | ICD-10-CM | POA: Diagnosis not present

## 2014-01-20 DIAGNOSIS — J3489 Other specified disorders of nose and nasal sinuses: Secondary | ICD-10-CM | POA: Insufficient documentation

## 2014-01-20 MED ORDER — SALINE SPRAY 0.65 % NA SOLN: 1.0000 | NASAL | Status: DC | PRN

## 2014-01-20 MED ORDER — PSEUDOEPHEDRINE HCL 30 MG PO TABS: 30.0000 mg | ORAL_TABLET | Freq: Four times a day (QID) | ORAL | Status: DC | PRN

## 2014-01-20 NOTE — ED Notes (Signed)
Pt c/o of swollen "knot" on lower neck, lateral to trachea on right side, with pain during movement of neck. No difficulty breathing.

## 2014-01-20 NOTE — Discharge Instructions (Signed)
Swollen Lymph Nodes  The lymphatic system filters fluid from around cells. It is like a system of blood vessels. These channels carry lymph instead of blood. The lymphatic system is an important part of the immune (disease fighting) system. When people talk about "swollen glands in the neck," they are usually talking about swollen lymph nodes. The lymph nodes are like the little traps for infection. You and your caregiver may be able to feel lymph nodes, especially swollen nodes, in these common areas: the groin (inguinal area), armpits (axilla), and above the clavicle (supraclavicular). You may also feel them in the neck (cervical) and the back of the head just above the hairline (occipital).  Swollen glands occur when there is any condition in which the body responds with an allergic type of reaction. For instance, the glands in the neck can become swollen from insect bites or any type of minor infection on the head. These are very noticeable in children with only minor problems. Lymph nodes may also become swollen when there is a tumor or problem with the lymphatic system, such as Hodgkin's disease.  TREATMENT    Most swollen glands do not require treatment. They can be observed (watched) for a short period of time, if your caregiver feels it is necessary. Most of the time, observation is not necessary.   Antibiotics (medicines that kill germs) may be prescribed by your caregiver. Your caregiver may prescribe these if he or she feels the swollen glands are due to a bacterial (germ) infection. Antibiotics are not used if the swollen glands are caused by a virus.  HOME CARE INSTRUCTIONS    Take medications as directed by your caregiver. Only take over-the-counter or prescription medicines for pain, discomfort, or fever as directed by your caregiver.  SEEK MEDICAL CARE IF:    If you begin to run a temperature greater than 102 F (38.9 C), or as your caregiver suggests.  MAKE SURE YOU:    Understand these  instructions.   Will watch your condition.   Will get help right away if you are not doing well or get worse.  Document Released: 01/14/2002 Document Revised: 04/18/2011 Document Reviewed: 01/24/2005  Exit[employer] Patient Information 2015 [employer]. This information is not intended to replace advice given to you by your health care provider. Make sure you discuss any questions you have with your health care provider.

## 2014-01-20 NOTE — ED Provider Notes (Signed)
CSN: [id number]     Arrival date & time 01/20/14  0706 History   First MD Initiated Contact with Patient 01/20/14 [phone]     Chief Complaint  Patient presents with  . Neck Pain     (Consider location/radiation/quality/duration/timing/severity/associated sxs/prior Treatment) HPI Comments: Patient with no pertinent past medical history presents to the emergency department with chief complaint of "knot on throat."  She states that she noticed the not too days ago. She reports associated nasal congestion 3-4 days. She denies fevers, chills, sore throat, cough. She denies taking anything for her symptoms. She states that she just wanted to be evaluated because her grandmother has throat cancer. Patient does not smoke or use chewing tobacco.  The history is provided by the patient. No language interpreter was used.    Past Medical History  Diagnosis Date  . Migraine   . Recurrent headache 11/08/2012  . Vaginal delivery 2004   Past Surgical History  Procedure Laterality Date  . Tonsillectomy    . Tonsillectomy  2008  . Laparoscopy N/A 02/18/2013    Procedure: LAPAROSCOPY OPERATIVE;  Surgeon: Levi AlandMark E Anderson, MD;  Location: [facility];  Service: Gynecology;  Laterality: N/A;  YAG LASER  . Yag laser application N/A 02/18/2013    Procedure: YAG LASER APPLICATION;  Surgeon: Levi AlandMark E Anderson, MD;  Location: [facility];  Service: Gynecology;  Laterality: N/A;   History reviewed. No pertinent family history. History  Substance Use Topics  . Smoking status: Never Smoker   . Smokeless tobacco: Never Used  . Alcohol Use: No   OB History    No data available     Review of Systems  Constitutional: Negative for fever and chills.  HENT: Positive for postnasal drip, rhinorrhea and sinus pressure. Negative for sneezing and sore throat.   Respiratory: Negative for cough and shortness of breath.   Cardiovascular: Negative for chest pain.  Gastrointestinal: Negative for nausea, vomiting, abdominal pain,  diarrhea and constipation.  Genitourinary: Negative for dysuria.  All other systems reviewed and are negative.     Allergies  Claritin  Home Medications   Prior to Admission medications   Medication Sig Start Date End Date Taking? Authorizing Provider  cholecalciferol (VITAMIN D) 1000 UNITS tablet Take 2,000 Units by mouth daily.    Historical Provider, MD  Cyanocobalamin (B-12 PO) Take 1 tablet by mouth daily.    Historical Provider, MD  IRON PO Take 1 tablet by mouth daily.    Historical Provider, MD  Meclizine HCl (BONINE PO) Take 1 tablet by mouth as needed (nausea).    Historical Provider, MD  Multiple Vitamins-Minerals (MULTIVITAMIN WITH MINERALS) tablet 1 tablet. Take 1 tablet by mouth daily.    Historical Provider, MD  naproxen sodium (ANAPROX) 220 MG tablet Take 220 mg by mouth 2 (two) times daily as needed (pain).    Historical Provider, MD  oxyCODONE-acetaminophen (PERCOCET) 10-325 MG per tablet Take 1 tablet by mouth every 4 (four) hours as needed for pain. 02/18/13   Levi AlandMark E Anderson, MD   BP 129/79 mmHg  Temp(Src) 98.2 F (36.8 C) (Oral)  Resp 18  SpO2 97% Physical Exam  Constitutional: She appears well-developed and well-nourished. No distress.  HENT:  Head: Normocephalic.  Right Ear: External ear normal.  Left Ear: External ear normal.  Mildly erythematous, no tonsillar exudate, no abscess, no stridor, uvula is midline  TMs clear bilaterally  Eyes: Conjunctivae and EOM are normal. Pupils are equal, round, and reactive to light.  Neck: Normal  range of motion. Neck supple.  Right-sided cervical adenopathy  Cardiovascular: Normal rate, regular rhythm and normal heart sounds.  Exam reveals no gallop and no friction rub.   No murmur heard. Pulmonary/Chest: Effort normal and breath sounds normal. No stridor. No respiratory distress. She has no wheezes. She has no rales. She exhibits no tenderness.  CTAB  Abdominal: Soft. Bowel sounds are normal. She exhibits no  distension. There is no tenderness.  Musculoskeletal: Normal range of motion. She exhibits no tenderness.  Lymphadenopathy:    She has cervical adenopathy.  Neurological: She is alert.  Skin: Skin is warm and dry. No rash noted. She is not diaphoretic.  Psychiatric: She has a normal mood and affect. Her behavior is normal. Judgment and thought content normal.  Nursing note and vitals reviewed.   ED Course  Procedures (including critical care time) Labs Review Labs Reviewed - No data to display  Imaging Review No results found.   EKG Interpretation None      MDM   Final diagnoses:  Nasal congestion  Lymphadenitis    Patient with mild lymphadenopathy and nasal congestion. I reassured the patient of this is likely related to the nasal congestion. Advised patient to use some warm compresses and treat symptoms. Recommend primary care follow-up in 1 week if symptoms do not improve. Patient understands and agrees with the plan. She is stable and ready for discharge.   Roxy Horsemanobert Gen Clagg, PA-C 01/20/14 [phone]  Arby BarretteMarcy Pfeiffer, MD 01/21/14 [phone]

## 2014-02-07 HISTORY — PX: REDUCTION MAMMAPLASTY: SUR839

## 2014-04-03 ENCOUNTER — Telehealth: Payer: Self-pay

## 2014-04-03 ENCOUNTER — Ambulatory Visit (INDEPENDENT_AMBULATORY_CARE_PROVIDER_SITE_OTHER): Payer: 59 | Admitting: Certified Nurse Midwife

## 2014-04-03 ENCOUNTER — Encounter: Payer: Self-pay | Admitting: Certified Nurse Midwife

## 2014-04-03 VITALS — BP 122/82 | HR 85 | Temp 99.1°F | Ht 66.0 in | Wt 238.0 lb

## 2014-04-03 DIAGNOSIS — N809 Endometriosis, unspecified: Secondary | ICD-10-CM

## 2014-04-03 DIAGNOSIS — Z01419 Encounter for gynecological examination (general) (routine) without abnormal findings: Secondary | ICD-10-CM

## 2014-04-03 DIAGNOSIS — N939 Abnormal uterine and vaginal bleeding, unspecified: Secondary | ICD-10-CM

## 2014-04-03 DIAGNOSIS — E669 Obesity, unspecified: Secondary | ICD-10-CM | POA: Diagnosis not present

## 2014-04-03 DIAGNOSIS — M549 Dorsalgia, unspecified: Secondary | ICD-10-CM | POA: Insufficient documentation

## 2014-04-03 DIAGNOSIS — G8929 Other chronic pain: Secondary | ICD-10-CM

## 2014-04-03 DIAGNOSIS — K219 Gastro-esophageal reflux disease without esophagitis: Secondary | ICD-10-CM | POA: Diagnosis not present

## 2014-04-03 DIAGNOSIS — N644 Mastodynia: Secondary | ICD-10-CM

## 2014-04-03 DIAGNOSIS — Z30018 Encounter for initial prescription of other contraceptives: Secondary | ICD-10-CM

## 2014-04-03 DIAGNOSIS — Z124 Encounter for screening for malignant neoplasm of cervix: Secondary | ICD-10-CM

## 2014-04-03 DIAGNOSIS — J320 Chronic maxillary sinusitis: Secondary | ICD-10-CM

## 2014-04-03 DIAGNOSIS — M546 Pain in thoracic spine: Secondary | ICD-10-CM

## 2014-04-03 DIAGNOSIS — J329 Chronic sinusitis, unspecified: Secondary | ICD-10-CM | POA: Insufficient documentation

## 2014-04-03 LAB — CBC WITH DIFFERENTIAL/PLATELET
Basophils Absolute: 0 10*3/uL (ref 0.0–0.1)
Basophils Relative: 0 % (ref 0–1)
EOS ABS: 0.1 10*3/uL (ref 0.0–0.7)
EOS PCT: 1 % (ref 0–5)
HCT: 41.3 % (ref 36.0–46.0)
HEMOGLOBIN: 13.3 g/dL (ref 12.0–15.0)
Lymphocytes Relative: 29 % (ref 12–46)
Lymphs Abs: 2.6 10*3/uL (ref 0.7–4.0)
MCH: 25.7 pg — AB (ref 26.0–34.0)
MCHC: 32.2 g/dL (ref 30.0–36.0)
MCV: 79.9 fL (ref 78.0–100.0)
MPV: 8.4 fL — ABNORMAL LOW (ref 8.6–12.4)
Monocytes Absolute: 0.6 10*3/uL (ref 0.1–1.0)
Monocytes Relative: 7 % (ref 3–12)
Neutro Abs: 5.7 10*3/uL (ref 1.7–7.7)
Neutrophils Relative %: 63 % (ref 43–77)
Platelets: 360 10*3/uL (ref 150–400)
RBC: 5.17 MIL/uL — ABNORMAL HIGH (ref 3.87–5.11)
RDW: 13.9 % (ref 11.5–15.5)
WBC: 9 10*3/uL (ref 4.0–10.5)

## 2014-04-03 MED ORDER — OMEPRAZOLE MAGNESIUM 20 MG PO TBEC: 20.0000 mg | DELAYED_RELEASE_TABLET | Freq: Every day | ORAL | Status: DC

## 2014-04-03 MED ORDER — ETONOGESTREL-ETHINYL ESTRADIOL 0.12-0.015 MG/24HR VA RING: VAGINAL_RING | VAGINAL | Status: DC

## 2014-04-03 NOTE — Progress Notes (Signed)
Patient ID: Dawn [name], female   DOB: 24-Oct-1980, 34 y.o.   MRN: [id number]    Subjective:     Dawn [name] is a 34 y.o. female here for a routine exam.  Current complaints: back pain related to large breast size, chronic sinus infections, weight gain.  Had surgery for endometriosis, abdominal lap 02/18/13.  Denies any hx of lupron use.  Not currently on any birth control.  Desires treatment for periods.  Chronic dysmenorrhea and heavy menorrhagia with quarter size clots lasting 7 days with each menses cycle.    Personal health questionnaire:  Is patient Ashkenazi Jewish, have a family history of breast and/or ovarian cancer: no Is there a family history of uterine cancer diagnosed at age < 72, gastrointestinal cancer, urinary tract cancer, family member who is a Personnel officer syndrome-associated carrier: no Is the patient overweight and hypertensive, family history of diabetes, personal history of gestational diabetes, preeclampsia or PCOS: yes Is patient over 58, have PCOS,  family history of premature CHD under age 30, diabetes, smoke, have hypertension or peripheral artery disease:  yes At any time, has a partner hit, kicked or otherwise hurt or frightened you?: no Over the past 2 weeks, have you felt down, depressed or hopeless?: no Over the past 2 weeks, have you felt little interest or pleasure in doing things?:no   Gynecologic History Patient's last menstrual period was 03/10/2014. Contraception: none Last Pap: unknown. Results were: unknown  Last mammogram: unknown. Results were: unknown  Obstetric History OB History  Gravida Para Term Preterm AB SAB TAB Ectopic Multiple Living  # Outcome Date GA Lbr Len/2nd Weight Sex Delivery Anes PTL Lv  3 SAB 2014     SAB     2 SAB 2013     SAB     1 Term 03/05/02 [redacted]w[redacted]d  3.175 kg (7 lb) F Vag-Spont EPI N Y      Past Medical History  Diagnosis Date  . Migraine   . Recurrent headache 11/08/2012  . Vaginal delivery 2004     Past Surgical History  Procedure Laterality Date  . Tonsillectomy    . Tonsillectomy  2008  . Laparoscopy N/A 02/18/2013    Procedure: LAPAROSCOPY OPERATIVE;  Surgeon: Levi Aland, MD;  Location: [facility];  Service: Gynecology;  Laterality: N/A;  YAG LASER  . Yag laser application N/A 02/18/2013    Procedure: YAG LASER APPLICATION;  Surgeon: Levi Aland, MD;  Location: [facility];  Service: Gynecology;  Laterality: N/A;     Current outpatient prescriptions:  .  cholecalciferol (VITAMIN D) 1000 UNITS tablet, Take 2,000 Units by mouth daily., Disp: , Rfl:  .  Cyanocobalamin (B-12 PO), Take 1 tablet by mouth daily., Disp: , Rfl:  .  IRON PO, Take 1 tablet by mouth daily., Disp: , Rfl:  .  Multiple Vitamins-Minerals (MULTIVITAMIN WITH MINERALS) tablet, 1 tablet. Take 1 tablet by mouth daily., Disp: , Rfl:  .  etonogestrel-ethinyl estradiol (NUVARING) 0.12-0.015 MG/24HR vaginal ring, Insert vaginally and leave in place for 3 consecutive weeks, then replace.  Have cycle every 3 months., Disp: 1 each, Rfl: 12 .  [DISCONTINUED] diphenhydrAMINE (BENADRYL) 25 MG tablet, Take 1 tablet (25 mg total) by mouth every 8 (eight) hours as needed for itching., Disp: 9 tablet, Rfl: 0 Allergies  Allergen Reactions  . Claritin [Loratadine] Other (See Comments)    Tremors    History  Substance Use Topics  .  Smoking status: Never Smoker   . Smokeless tobacco: Never Used  . Alcohol Use: No    Family History  Problem Relation Age of Onset  . Hyperlipidemia Mother   . Hypertension Mother       Review of Systems  Constitutional: negative for fatigue and weight , + weight gain Respiratory: negative for cough and wheezing Cardiovascular: negative for chest pain, fatigue and palpitations Gastrointestinal: negative for change in bowel habits, positive for epigastric pain Musculoskeletal:negative for myalgias Neurological: negative for gait problems and tremors Behavioral/Psych: negative for abusive  relationship, depression Endocrine: negative for temperature intolerance   Genitourinary:negative for  genital lesions, hot flashes, sexual problems and vaginal discharge. Positive forabnormal menstrual periods.    Integument/breast: negative for breast lump, breast tenderness, nipple discharge and skin lesion(s)    Objective:       BP 122/82 mmHg  Pulse 85  Temp(Src) 99.1 F (37.3 C)  Ht 5\' 6"  (1.676 m)  Wt 107.956 kg (238 lb)  BMI 38.43 kg/m2  LMP 03/10/2014 General:   alert  Skin:   no rash or abnormalities  Lungs:   clear to auscultation bilaterally  Heart:   regular rate and rhythm, S1, S2 normal, no murmur, click, rub or gallop  Breasts:   normal without suspicious masses, skin or nipple changes or axillary nodes  Abdomen:  normal findings: no organomegaly, soft, non-tender and no hernia  Pelvis:  External genitalia: normal general appearance Urinary system: urethral meatus normal and bladder without fullness, nontender Vaginal: normal without tenderness, induration or masses Cervix: normal appearance Adnexa: normal bimanual exam Uterus: anteverted and non-tender, normal size   Lab Review Urine pregnancy test Labs reviewed yes Radiologic studies reviewed yes     Assessment:    Healthy female exam.   Obesity Endometriosis Chronic Sinusitis   Plan:    Education reviewed: low fat, low cholesterol diet, safe sex/STD prevention and self breast exams. Contraception: NuvaRing vaginal inserts. Mammogram ordered. Follow up in: 1 year.   Meds ordered this encounter  Medications  . etonogestrel-ethinyl estradiol (NUVARING) 0.12-0.015 MG/24HR vaginal ring    Sig: Insert vaginally and leave in place for 3 consecutive weeks, then replace.  Have cycle every 3 months.    Dispense:  1 each    Refill:  12   Orders Placed This Encounter  Procedures  . SureSwab, Vaginosis/Vaginitis Plus  . Hepatitis B surface antigen  . Hepatitis C antibody  . HIV antibody (with  reflex)  . CBC with Differential/Platelet  . RPR  . Ambulatory referral to ENT    Referral Priority:  Routine    Referral Type:  Consultation    Referral Reason:  Specialty Services Required    Requested Specialty:  Otolaryngology    Number of Visits Requested:  1  . Ambulatory referral to Plastic Surgery    Referral Priority:  Routine    Referral Type:  Surgical    Referral Reason:  Specialty Services Required    Requested Specialty:  Plastic Surgery    Number of Visits Requested:  1  . Ambulatory referral to Endocrinology    Referral Priority:  Routine    Referral Type:  Consultation    Referral Reason:  Specialty Services Required    Number of Visits Requested:  1   Need to obtain previous records

## 2014-04-03 NOTE — Telephone Encounter (Signed)
GAVE PATIENT HER APPT DATE AND TIME FOR [facility] FOR 04/09/14 AT 9:20AM - WILL CALL HER ON OTHER 2 REFERRALS

## 2014-04-04 ENCOUNTER — Telehealth: Payer: Self-pay

## 2014-04-04 LAB — HIV ANTIBODY (ROUTINE TESTING W REFLEX): HIV: NONREACTIVE

## 2014-04-04 LAB — RPR

## 2014-04-04 LAB — HEPATITIS B SURFACE ANTIGEN: Hepatitis B Surface Ag: NEGATIVE

## 2014-04-04 LAB — HEPATITIS C ANTIBODY: HCV AB: NEGATIVE

## 2014-04-04 NOTE — Telephone Encounter (Signed)
patient has appt with [facility] at 11:15am on 04/10/14 - called pateint to let her know to be there by 11am and gave her their phone number to call them

## 2014-04-07 ENCOUNTER — Telehealth: Payer: Self-pay

## 2014-04-07 LAB — PAP IG AND HPV HIGH-RISK: HPV DNA High Risk: NOT DETECTED

## 2014-04-07 NOTE — Telephone Encounter (Signed)
alled patient about breast reduction consult with Etter Sjogrenavid Bowers office - sch ofr 3/16 at 8:45am - [facility] we have referred her to 3 places to make sure she knows where to go

## 2014-04-08 ENCOUNTER — Telehealth: Payer: Self-pay

## 2014-04-08 LAB — SURESWAB, VAGINOSIS/VAGINITIS PLUS
Atopobium vaginae: NOT DETECTED Log (cells/mL)
C. PARAPSILOSIS, DNA: NOT DETECTED
C. albicans, DNA: NOT DETECTED
C. glabrata, DNA: NOT DETECTED
C. trachomatis RNA, TMA: NOT DETECTED
C. tropicalis, DNA: NOT DETECTED
GARDNERELLA VAGINALIS: NOT DETECTED Log (cells/mL)
LACTOBACILLUS SPECIES: 7.4 Log (cells/mL)
MEGASPHAERA SPECIES: NOT DETECTED Log (cells/mL)
N. gonorrhoeae RNA, TMA: NOT DETECTED
T. VAGINALIS RNA, QL TMA: NOT DETECTED

## 2014-04-08 NOTE — Telephone Encounter (Signed)
CANCELLED [facility] APPT AND SENT NOTES ETC TO [facility] FOR APPT - PATIETN IS AWARE

## 2014-04-10 ENCOUNTER — Ambulatory Visit: Payer: Self-pay | Admitting: Internal Medicine

## 2014-04-11 ENCOUNTER — Telehealth: Payer: Self-pay

## 2014-04-11 NOTE — Telephone Encounter (Signed)
called patient - told her to call [facility] regarding endometriosis referral they had been trying to call her for appt - told her to call [phone] [facility]

## 2014-04-14 ENCOUNTER — Telehealth: Payer: Self-pay

## 2014-04-14 NOTE — Telephone Encounter (Signed)
patient has appt with Dr. April MansonYalcinkaya on 5/6 at 2pm

## 2014-05-21 ENCOUNTER — Ambulatory Visit (INDEPENDENT_AMBULATORY_CARE_PROVIDER_SITE_OTHER): Payer: 59 | Admitting: Certified Nurse Midwife

## 2014-05-21 ENCOUNTER — Encounter: Payer: Self-pay | Admitting: Certified Nurse Midwife

## 2014-05-21 ENCOUNTER — Telehealth: Payer: Self-pay | Admitting: *Deleted

## 2014-05-21 VITALS — BP 126/84 | HR 92 | Temp 98.7°F | Ht 66.0 in | Wt 232.0 lb

## 2014-05-21 DIAGNOSIS — N939 Abnormal uterine and vaginal bleeding, unspecified: Secondary | ICD-10-CM

## 2014-05-21 DIAGNOSIS — M2480 Other specific joint derangements of unspecified joint, not elsewhere classified: Secondary | ICD-10-CM

## 2014-05-21 DIAGNOSIS — M6289 Other specified disorders of muscle: Secondary | ICD-10-CM

## 2014-05-21 DIAGNOSIS — N814 Uterovaginal prolapse, unspecified: Secondary | ICD-10-CM | POA: Diagnosis not present

## 2014-05-21 MED ORDER — TRANEXAMIC ACID 650 MG PO TABS: 1300.0000 mg | ORAL_TABLET | Freq: Three times a day (TID) | ORAL | Status: DC

## 2014-05-21 MED ORDER — OXYCODONE HCL 10 MG PO TABS: 10.0000 mg | ORAL_TABLET | ORAL | Status: DC | PRN

## 2014-05-21 NOTE — Progress Notes (Signed)
Patient ID: Dawn [name], female   DOB: 09-Jul-1980, 34 y.o.   MRN: [id number]   Chief Complaint  Patient presents with  . Problem    HPI Dawn [name] is a 34 y.o. female.  Who presents presents for c/o "something falling out of her vagina and can feel it when she wipes" has been occuring for the last 2-3 weeks, is aggravated by her current period.  Has tried sitz baths.  Was on nuva ring.  Encouraged to quit the [employer].  The patient has been counseled about the treatment options for POP. Denies any leaking of urine.   Encouraged pelvic rest.  Has a hx of endometriosis.  Does not desire another pregnancy, has one living child.   HPI  Past Medical History  Diagnosis Date  . Migraine   . Recurrent headache 11/08/2012  . Vaginal delivery 2004    Past Surgical History  Procedure Laterality Date  . Tonsillectomy    . Tonsillectomy  2008  . Laparoscopy N/A 02/18/2013    Procedure: LAPAROSCOPY OPERATIVE;  Surgeon: Levi AlandMark E Anderson, MD;  Location: [facility];  Service: Gynecology;  Laterality: N/A;  YAG LASER  . Yag laser application N/A 02/18/2013    Procedure: YAG LASER APPLICATION;  Surgeon: Levi AlandMark E Anderson, MD;  Location: [facility];  Service: Gynecology;  Laterality: N/A;    Family History  Problem Relation Age of Onset  . Hyperlipidemia Mother   . Hypertension Mother     Social History History  Substance Use Topics  . Smoking status: Never Smoker   . Smokeless tobacco: Never Used  . Alcohol Use: No    Allergies  Allergen Reactions  . Claritin [Loratadine] Other (See Comments)    Tremors    Current Outpatient Prescriptions  Medication Sig Dispense Refill  . cholecalciferol (VITAMIN D) 1000 UNITS tablet Take 2,000 Units by mouth daily.    . Cyanocobalamin (B-12 PO) Take 1 tablet by mouth daily.    . IRON PO Take 1 tablet by mouth daily.    . Multiple Vitamins-Minerals (MULTIVITAMIN WITH MINERALS) tablet 1 tablet. Take 1 tablet by mouth daily.    Marland Kitchen. omeprazole (PRILOSEC OTC) 20 MG  tablet Take 1 tablet (20 mg total) by mouth daily. 28 tablet 1  . Oxycodone HCl 10 MG TABS Take 1 tablet (10 mg total) by mouth as needed (Every 4-6 hours as needed for pain.). 40 tablet 0  . tranexamic acid (LYSTEDA) 650 MG TABS tablet Take 2 tablets (1,300 mg total) by mouth 3 (three) times daily. 30 tablet 2  . [DISCONTINUED] diphenhydrAMINE (BENADRYL) 25 MG tablet Take 1 tablet (25 mg total) by mouth every 8 (eight) hours as needed for itching. 9 tablet 0   No current facility-administered medications for this visit.    Review of Systems Review of Systems Constitutional: negative for fatigue and weight loss Respiratory: negative for cough and wheezing Cardiovascular: negative for chest pain, fatigue and palpitations Gastrointestinal: negative for abdominal pain and change in bowel habits Genitourinary: + pelvic pain/discomfort Integument/breast: negative for nipple discharge Musculoskeletal:negative for myalgias Neurological: negative for gait problems and tremors Behavioral/Psych: negative for abusive relationship, depression Endocrine: negative for temperature intolerance     Blood pressure 126/84, pulse 92, temperature 98.7 F (37.1 C), height 5\' 6"  (1.676 m), weight 105.235 kg (232 lb), last menstrual period 05/07/2014.  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  The prolapse is impacting her quality of life.  She is unable to sit for long periods of time and feels pressure with coughing, standing, and has trouble walking. She is not currently sexually active, was told to have pelvic rest until seen by urogynecology.  The information documented in the HPI was reviewed and verified.  Active vaginal bleeding present on exam.  Vaginal introitus is very tender to palpation.   Unable to palpate passed around 2 cm into the vagina.  Cervix present at around 2 cm.      General:   alert  Skin:   no rash or abnormalities  Lungs:   clear to  auscultation bilaterally  Heart:   regular rate and rhythm, S1, S2 normal, no murmur, click, rub or gallop  Breasts:   deferred  Abdomen:  normal findings: no organomegaly, soft, non-tender and no hernia  Pelvis:  External genitalia: normal general appearance    75% of 15 min visit spent on counseling and coordination of care.   Data Reviewed Previous medical hx, labs, medicaitons  Assessment     Uterine prolapse   Menorrhagia  Plan    Orders Placed This Encounter  Procedures  . Ambulatory referral to Urogynecology    Referral Priority:  Routine    Referral Type:  Consultation    Referral Reason:  Specialty Services Required    Requested Specialty:  Urology    Number of Visits Requested:  1   Meds ordered this encounter  Medications  . tranexamic acid (LYSTEDA) 650 MG TABS tablet    Sig: Take 2 tablets (1,300 mg total) by mouth 3 (three) times daily.    Dispense:  30 tablet    Refill:  2  . Oxycodone HCl 10 MG TABS    Sig: Take 1 tablet (10 mg total) by mouth as needed (Every 4-6 hours as needed for pain.).    Dispense:  40 tablet    Refill:  0

## 2014-05-21 NOTE — Telephone Encounter (Signed)
Patient request call back. 3:20 Call to patient- patient state she took a Ship brokermirror and looked and she has something coming out- asked patient if she could come to the office today - she said she could. We will see her. Rachelle notified.

## 2014-05-21 NOTE — Telephone Encounter (Signed)
Patient states she was put on Nuva Ring for cycle control and she has been bleeding for 2 weeks with a steady flow.Patient took the ring out to have a cycle then she continued to bleed. Patient advised to reinsert a new ring. Patient also states she has some irritation on the vulva. Advised warm soaks and if she feels she needs to be seen- ie... Cyst present,ect to call office for appointment.

## 2014-05-22 ENCOUNTER — Telehealth: Payer: Self-pay

## 2014-05-22 NOTE — Telephone Encounter (Signed)
Patient is scheduled for appt with Dr. Lavella Hammockatherine Matthews on 06/04/14 at 1:30pm - gave her [place] phone number [place] - they will send out a new patient packet as well

## 2014-05-27 ENCOUNTER — Other Ambulatory Visit: Payer: Self-pay | Admitting: Certified Nurse Midwife

## 2014-05-27 ENCOUNTER — Telehealth: Payer: Self-pay | Admitting: *Deleted

## 2014-05-27 DIAGNOSIS — N819 Female genital prolapse, unspecified: Secondary | ICD-10-CM

## 2014-05-27 MED ORDER — MELOXICAM 15 MG PO TABS: 15.0000 mg | ORAL_TABLET | Freq: Every day | ORAL | Status: DC

## 2014-05-27 NOTE — Telephone Encounter (Signed)
Patient called to ask if we could find her a different doctor to go to- her referral appointment is a week out and she is very uncomfortable. 1:00 After calling to Dr Anastasio AuerbachMatthew's office [facility]( [place]) there is not an earlier appointment- Rachelle recommends that patient try to keep the appointment. The patient states she medication that she was given makes her sleepy during the day and she is uncomfortable. Patient given Rx for Mobic during the day and estrace samples with instructions to use at introitus where prolapse is painful until her appointment.

## 2014-06-04 ENCOUNTER — Telehealth: Payer: Self-pay | Admitting: *Deleted

## 2014-06-04 NOTE — Telephone Encounter (Signed)
Patient states she was referred to Dr Ashley RoyaltyMatthews for a prolapse. Dr Ashley RoyaltyMatthews wants her to have an US before her appointment next week. Can we do it at our office so she will have it prior to her visit- can Rachelle call her?

## 2014-06-05 ENCOUNTER — Other Ambulatory Visit: Payer: Self-pay | Admitting: Certified Nurse Midwife

## 2014-06-05 DIAGNOSIS — N814 Uterovaginal prolapse, unspecified: Secondary | ICD-10-CM

## 2014-06-05 NOTE — Telephone Encounter (Signed)
Dawn [name]; I have ordered her ultrasound, I am not sure that [name] can do it in the office it may need [facility] but I am not sure that she will be able to take the results with her? I guess they can look them up in epic?

## 2014-06-10 ENCOUNTER — Telehealth: Payer: Self-pay

## 2014-06-10 NOTE — Telephone Encounter (Signed)
Patient has surgery scheduled 5/18 and she has her US scheduled before that- Dr Ashley RoyaltyMatthews will get results.

## 2014-06-10 NOTE — Telephone Encounter (Signed)
PATIENT SAW DR Santina EvansATHERINE MATTHEWS AT [facility] ON 5/3 - THEY SCHEDULED HER AN ULTRASOUND AT THEIR OFFICE ON 06/16/14 AT 1PM PER THEIR OFFICE SCHEDULER

## 2014-06-22 ENCOUNTER — Emergency Department (HOSPITAL_COMMUNITY)
Admission: EM | Admit: 2014-06-22 | Discharge: 2014-06-22 | Disposition: A | Discharge status: Home / Self Care | Payer: 59 | Attending: Emergency Medicine | Admitting: Emergency Medicine

## 2014-06-22 ENCOUNTER — Encounter (HOSPITAL_COMMUNITY): Payer: Self-pay

## 2014-06-22 DIAGNOSIS — W25XXXA Contact with sharp glass, initial encounter: Secondary | ICD-10-CM | POA: Diagnosis not present

## 2014-06-22 DIAGNOSIS — Z791 Long term (current) use of non-steroidal anti-inflammatories (NSAID): Secondary | ICD-10-CM | POA: Diagnosis not present

## 2014-06-22 DIAGNOSIS — Y9289 Other specified places as the place of occurrence of the external cause: Secondary | ICD-10-CM | POA: Insufficient documentation

## 2014-06-22 DIAGNOSIS — Z79899 Other long term (current) drug therapy: Secondary | ICD-10-CM | POA: Insufficient documentation

## 2014-06-22 DIAGNOSIS — Z8679 Personal history of other diseases of the circulatory system: Secondary | ICD-10-CM | POA: Insufficient documentation

## 2014-06-22 DIAGNOSIS — S91342A Puncture wound with foreign body, left foot, initial encounter: Secondary | ICD-10-CM | POA: Insufficient documentation

## 2014-06-22 DIAGNOSIS — Y9389 Activity, other specified: Secondary | ICD-10-CM | POA: Diagnosis not present

## 2014-06-22 DIAGNOSIS — Y998 Other external cause status: Secondary | ICD-10-CM | POA: Diagnosis not present

## 2014-06-22 DIAGNOSIS — S90852A Superficial foreign body, left foot, initial encounter: Secondary | ICD-10-CM

## 2014-06-22 MED ORDER — LIDOCAINE HCL 2 % IJ SOLN
5.0000 mL | Freq: Once | INTRAMUSCULAR | Status: AC
  Administered 2014-06-22: 100 mg via INTRADERMAL
  Filled 2014-06-22: qty 20

## 2014-06-22 NOTE — Discharge Instructions (Signed)
Sliver Removal °You have had a sliver (splinter) removed. This has caused a wound that extends through some or all layers of the skin and possibly into the subcutaneous tissue. This is the tissue just beneath the skin. Because these wounds can not be cleaned well, it is necessary to watch closely for infection. °AFTER THE PROCEDURE  °If a cut (incision) was necessary to remove this, it may have been repaired for you by your caregiver either with suturing, stapling, or adhesive strips. These keep together the skin edges and allow better and faster healing. °HOME CARE INSTRUCTIONS  °· A dressing may have been applied. This may be changed once per day or as instructed. If the dressing sticks, it may be soaked off with a gauze pad or clean cloth that has been dampened with soapy water or hydrogen peroxide. °· It is difficult to remove all slivers or foreign bodies as they may break or splinter into smaller pieces. Be aware that your body will work to remove the foreign substance. That is, the foreign body may work itself out of the wound. That is normal. °· Watch for signs of infection and notify your caregiver if you suspect a sliver or foreign body remains in the wound. °· You may have received a recommendation to follow up with your physician or a specialist. It is very important to call for or keep follow-up appointments in order to avoid infection or other complications. °· Only take over-the-counter or prescription medicines for pain, discomfort, or fever as directed by your caregiver. °· If antibiotics were prescribed, be sure to finish all of the medicine. °If you did not receive a tetanus shot today because you did not recall when your last one was given, check with your caregiver in the next day or two during follow up to determine if one is needed. °SEEK MEDICAL CARE IF:  °· The area around the wound has new or worsening redness or tenderness. °· Pus is coming from the wound °· There is a foul smell from the  wound or dressing °· The edges of a wound that had been repaired break open °SEEK IMMEDIATE MEDICAL CARE IF:  °· Red streaks are coming from the wound °· An unexplained oral temperature above 102° F (38.9° C) develops. °Document Released: 01/22/2000 Document Revised: 04/18/2011 Document Reviewed: 09/10/2007 °ExitCare® Patient Information ©2015 [employer]. This information is not intended to replace advice given to you by your health care provider. Make sure you discuss any questions you have with your health care provider. ° °

## 2014-06-22 NOTE — ED Provider Notes (Signed)
CSN: [id number]     Arrival date & time 06/22/14  2024 History  This chart was scribed for non-physician practitioner, Elpidio AnisShari Rajvir Ernster, PA-C,working with Richardean Canalavid H Yao, MD, by Karle PlumberJennifer Tensley, ED Scribe. This patient was seen in room WTR6/WTR6 and the patient's care was started at 9:05 PM.  Chief Complaint  Patient presents with  . Foreign Body   Patient is a 34 y.o. female presenting with foreign body. The history is provided by the patient and medical records. No language interpreter was used.  Foreign Body   HPI Comments:  Dawn [name] is a 34 y.o. female who presents to the Emergency Department complaining of glass embedded in the bottom of her left foot secondary to stepping on broken glass three days ago. She states she thought she had successfully removed the glass but has been experiencing pain in the area where she believed the glass was. Bearing weight makes the pain worse. Resting the foot without pressure on the area helps to alleviate the pain. Denies fever, chills, numbness, tingling or weakness of the left foot, redness, bleeding or drainage. Pt states her last tetanus vaccination was a couple years ago.  Past Medical History  Diagnosis Date  . Migraine   . Recurrent headache 11/08/2012  . Vaginal delivery 2004   Past Surgical History  Procedure Laterality Date  . Tonsillectomy    . Tonsillectomy  2008  . Laparoscopy N/A 02/18/2013    Procedure: LAPAROSCOPY OPERATIVE;  Surgeon: Levi AlandMark E Anderson, MD;  Location: [facility];  Service: Gynecology;  Laterality: N/A;  YAG LASER  . Yag laser application N/A 02/18/2013    Procedure: YAG LASER APPLICATION;  Surgeon: Levi AlandMark E Anderson, MD;  Location: [facility];  Service: Gynecology;  Laterality: N/A;   Family History  Problem Relation Age of Onset  . Hyperlipidemia Mother   . Hypertension Mother    History  Substance Use Topics  . Smoking status: Never Smoker   . Smokeless tobacco: Never Used  . Alcohol Use: No   OB History    Gravida Para  Term Preterm AB TAB SAB Ectopic Multiple Living   3 1 1  2  2   1      Review of Systems  Constitutional: Negative for fever and chills.  Skin: Positive for wound. Negative for color change.  Neurological: Negative for weakness and numbness.    Allergies  Claritin  Home Medications   Prior to Admission medications   Medication Sig Start Date End Date Taking? Authorizing Provider  cholecalciferol (VITAMIN D) 1000 UNITS tablet Take 2,000 Units by mouth daily.    Historical Provider, MD  Cyanocobalamin (B-12 PO) Take 1 tablet by mouth daily.    Historical Provider, MD  IRON PO Take 1 tablet by mouth daily.    Historical Provider, MD  meloxicam (MOBIC) 15 MG tablet Take 1 tablet (15 mg total) by mouth daily. 05/27/14   Rachelle A Denney, CNM  Multiple Vitamins-Minerals (MULTIVITAMIN WITH MINERALS) tablet 1 tablet. Take 1 tablet by mouth daily.    Historical Provider, MD  omeprazole (PRILOSEC OTC) 20 MG tablet Take 1 tablet (20 mg total) by mouth daily. 04/03/14 05/01/14  Rachelle A Denney, CNM  Oxycodone HCl 10 MG TABS Take 1 tablet (10 mg total) by mouth as needed (Every 4-6 hours as needed for pain.). 05/21/14   Rachelle A Denney, CNM  tranexamic acid (LYSTEDA) 650 MG TABS tablet Take 2 tablets (1,300 mg total) by mouth 3 (three) times daily. 05/21/14   Rachelle  A Denney, CNM   Triage Vitals: BP 131/73 mmHg  Pulse 88  Temp(Src) 98.6 F (37 C) (Oral)  Resp 18  SpO2 100%  LMP 06/20/2014 (Exact Date) Physical Exam  Constitutional: She is oriented to person, place, and time. She appears well-developed and well-nourished.  HENT:  Head: Normocephalic and atraumatic.  Eyes: EOM are normal.  Neck: Normal range of motion.  Cardiovascular: Normal rate.   Pulmonary/Chest: Effort normal.  Musculoskeletal: Normal range of motion.  Left foot with small, superficial puncture wound to the lateral aspect of the plantar surface. No swelling or discoloration.  Neurological: She is alert and oriented  to person, place, and time.  Skin: Skin is warm and dry.  Psychiatric: She has a normal mood and affect. Her behavior is normal.  Nursing note and vitals reviewed.   ED Course  Procedures (including critical care time) DIAGNOSTIC STUDIES: Oxygen Saturation is 100% on RA, normal by my interpretation.   COORDINATION OF CARE: 9:09 PM- Will numb area and look for superficially embedded glass. Pt verbalizes understanding and agrees to plan.  Medications - No data to display  Labs Review Labs Reviewed - No data to display  Imaging Review No results found.   EKG Interpretation None     Procedure: 2% plain lidocaine used to numb area of left plantar foot at site of suspected FB. #11 blade used to make a .5 cm incision and wound explored. Small glass fragment removed without difficulty. Dressing applied.  MDM   Final diagnoses:  None    1. Foreign body foot  FB removed as per above note.   I personally performed the services described in this documentation, which was scribed in my presence. The recorded information has been reviewed and is accurate.    Elpidio AnisShari Anmol Fleck, PA-C 06/26/14 2117  Richardean Canalavid H Yao, MD 06/28/14 [id number]

## 2014-06-22 NOTE — ED Notes (Signed)
Patient reports she stepped on glass three days ago.  She thought she had removed all of the pieces, but felt a lot of pain in her left foot when she stood today.

## 2014-06-26 HISTORY — PX: VAGINAL HYSTERECTOMY: SUR661

## 2014-08-22 NOTE — Pre-Procedure Instructions (Signed)
Dawn [name]  08/22/2014      [id number] [place], [place] - [address] [place] [address] Phone: [phone] Fax: [phone]  [facility] [place], [place] - [address] AT [facility] [address] & [facility] [address] [place] [address] Phone: [phone] Fax: [phone]  [facility]ORE [address] [place], [place] - [address] AT [facility] OF [facility] [address] & [facility] [address] [place] [address] Phone: [phone] Fax: [phone]  [facility]ORE [address] [place], [place] - [address] AT [facility] OF [facility] & [address] [place] [address] Phone: [phone] Fax: [phone]  [facility] [address] - [address] [place] - [address] [phone] Ernestine Mcmurray [place] [address] Phone: [phone] Fax: [phone]    Your procedure is scheduled on 08/28/2014  Report to [facility] Admitting at 5:30 A.M.  Call this number if you have problems the morning of surgery:  [phone]   Remember:  Do not eat food or drink liquids after midnight.on Wednesday night   Take these medicines the morning of surgery with A SIP OF WATER         NONE  (STOP VITAMINS)   Do not wear jewelry, make-up or nail polish.   Do not wear lotions, powders, or perfumes.  You may wear deodorant.   Do not shave 48 hours prior to surgery.     Do not bring valuables to the hospital.   [facility] is not responsible for any belongings or valuables.  Contacts, dentures or bridgework may not be worn into surgery.  Leave your suitcase in the car.  After surgery it may be brought to your room.  For patients admitted to the hospital, discharge time will be determined by your treatment team.  Patients discharged the day of surgery will not be allowed to drive home.   Name and phone number of your driver:    Special instructions:  Special Instructions:  [facility] - Preparing for Surgery  Before surgery, you can play an important role.  Because skin is not sterile, your skin needs to be as free of germs as possible.  You can reduce the number of germs on you skin by washing with CHG (chlorahexidine gluconate) soap before surgery.  CHG is an antiseptic cleaner which kills germs and bonds with the skin to continue killing germs even after washing.  Please DO NOT use if you have an allergy to CHG or antibacterial soaps.  If your skin becomes reddened/irritated stop using the CHG and inform your nurse when you arrive at Short Stay.  Do not shave (including legs and underarms) for at least 48 hours prior to the first CHG shower.  You may shave your face.  Please follow these instructions carefully:   1.  Shower with CHG Soap the night before surgery and the  morning of Surgery.  2.  If you choose to wash your hair, wash your hair first as usual with your  normal shampoo.  3.  After you shampoo, rinse your hair and body thoroughly to remove the  Shampoo.  4.  Use CHG as you would any other liquid soap.  You can apply chg directly to the skin and wash gently with scrungie or a clean washcloth.  5.  Apply the CHG Soap to your body ONLY FROM THE NECK DOWN.  Do not use on open wounds or open sores.  Avoid contact with your eyes, ears, mouth and genitals (private parts).  Wash genitals (private parts)   with your normal soap.  6.  Wash thoroughly, paying special attention to the area where your surgery will be performed.  7.  Thoroughly rinse your body with warm water from the neck down.  8.  DO NOT shower/wash with your normal soap after using and rinsing off   the CHG Soap.  9.  Pat yourself dry with a clean towel.            10.  Wear clean pajamas.            11.  Place clean sheets on your bed the night of your first shower and do not sleep with pets.  Day of Surgery  Do not apply any lotions/deodorants the morning of surgery.  Please wear  clean clothes to the hospital/surgery center.  Please read over the following fact sheets that you were given. Pain Booklet, Coughing and Deep Breathing and Surgical Site Infection Prevention

## 2014-08-25 ENCOUNTER — Encounter (HOSPITAL_COMMUNITY): Payer: Self-pay

## 2014-08-25 ENCOUNTER — Encounter (HOSPITAL_COMMUNITY)
Admission: RE | Admit: 2014-08-25 | Discharge: 2014-08-25 | Disposition: A | Discharge status: Home / Self Care | Payer: 59 | Source: Ambulatory Visit | Attending: Plastic Surgery | Admitting: Plastic Surgery

## 2014-08-25 DIAGNOSIS — M25519 Pain in unspecified shoulder: Secondary | ICD-10-CM | POA: Diagnosis not present

## 2014-08-25 DIAGNOSIS — N62 Hypertrophy of breast: Secondary | ICD-10-CM | POA: Diagnosis not present

## 2014-08-25 DIAGNOSIS — M542 Cervicalgia: Secondary | ICD-10-CM | POA: Diagnosis not present

## 2014-08-25 DIAGNOSIS — Z6839 Body mass index (BMI) 39.0-39.9, adult: Secondary | ICD-10-CM | POA: Diagnosis not present

## 2014-08-25 DIAGNOSIS — M549 Dorsalgia, unspecified: Secondary | ICD-10-CM | POA: Diagnosis not present

## 2014-08-25 HISTORY — DX: Other specified postprocedural states: Z98.890

## 2014-08-25 HISTORY — DX: Nausea with vomiting, unspecified: R11.2

## 2014-08-25 LAB — CBC
HCT: 37.4 % (ref 36.0–46.0)
Hemoglobin: 11.9 g/dL — ABNORMAL LOW (ref 12.0–15.0)
MCH: 25.5 pg — ABNORMAL LOW (ref 26.0–34.0)
MCHC: 31.8 g/dL (ref 30.0–36.0)
MCV: 80.1 fL (ref 78.0–100.0)
Platelets: 341 K/uL (ref 150–400)
RBC: 4.67 MIL/uL (ref 3.87–5.11)
RDW: 13.9 % (ref 11.5–15.5)
WBC: 6.9 K/uL (ref 4.0–10.5)

## 2014-08-28 ENCOUNTER — Ambulatory Visit (HOSPITAL_COMMUNITY)
Admission: RE | Admit: 2014-08-28 | Discharge: 2014-08-29 | Disposition: A | Discharge status: Home / Self Care | Payer: 59 | Source: Ambulatory Visit | Attending: Plastic Surgery | Admitting: Plastic Surgery

## 2014-08-28 ENCOUNTER — Ambulatory Visit (HOSPITAL_COMMUNITY): Payer: 59 | Admitting: Anesthesiology

## 2014-08-28 ENCOUNTER — Encounter (HOSPITAL_COMMUNITY)
Admission: RE | Disposition: A | Discharge status: Home / Self Care | Payer: Self-pay | Source: Ambulatory Visit | Attending: Plastic Surgery

## 2014-08-28 ENCOUNTER — Encounter (HOSPITAL_COMMUNITY): Payer: Self-pay | Admitting: *Deleted

## 2014-08-28 DIAGNOSIS — N62 Hypertrophy of breast: Secondary | ICD-10-CM | POA: Diagnosis not present

## 2014-08-28 DIAGNOSIS — M25519 Pain in unspecified shoulder: Secondary | ICD-10-CM | POA: Insufficient documentation

## 2014-08-28 DIAGNOSIS — Z6839 Body mass index (BMI) 39.0-39.9, adult: Secondary | ICD-10-CM | POA: Insufficient documentation

## 2014-08-28 DIAGNOSIS — M549 Dorsalgia, unspecified: Secondary | ICD-10-CM | POA: Insufficient documentation

## 2014-08-28 DIAGNOSIS — M542 Cervicalgia: Secondary | ICD-10-CM | POA: Insufficient documentation

## 2014-08-28 HISTORY — PX: BREAST REDUCTION SURGERY: SHX8

## 2014-08-28 HISTORY — PX: REDUCTION MAMMAPLASTY: SUR839

## 2014-08-28 SURGERY — MAMMOPLASTY, REDUCTION
Anesthesia: General | Site: Breast | Laterality: Bilateral

## 2014-08-28 MED ORDER — METHOCARBAMOL 500 MG PO TABS
500.0000 mg | ORAL_TABLET | Freq: Four times a day (QID) | ORAL | Status: DC
  Administered 2014-08-28 – 2014-08-29 (×5): 500 mg via ORAL
  Filled 2014-08-28 (×4): qty 1

## 2014-08-28 MED ORDER — CEFAZOLIN SODIUM 1-5 GM-% IV SOLN
1.0000 g | Freq: Four times a day (QID) | INTRAVENOUS | Status: AC
  Administered 2014-08-28 – 2014-08-29 (×4): 1 g via INTRAVENOUS
  Filled 2014-08-28 (×4): qty 50

## 2014-08-28 MED ORDER — ONDANSETRON HCL 4 MG/2ML IJ SOLN
INTRAMUSCULAR | Status: DC | PRN
  Administered 2014-08-28 (×2): 4 mg via INTRAVENOUS

## 2014-08-28 MED ORDER — HYDROMORPHONE HCL 1 MG/ML IJ SOLN: 0.5000 mg | INTRAMUSCULAR | Status: DC | PRN

## 2014-08-28 MED ORDER — LACTATED RINGERS IV SOLN
INTRAVENOUS | Status: DC
  Administered 2014-08-28 – 2014-08-29 (×2): via INTRAVENOUS

## 2014-08-28 MED ORDER — LACTATED RINGERS IV SOLN
INTRAVENOUS | Status: DC | PRN
  Administered 2014-08-28 (×2): via INTRAVENOUS

## 2014-08-28 MED ORDER — OXYCODONE HCL 5 MG PO TABS
ORAL_TABLET | ORAL | Status: AC
  Administered 2014-08-28: 5 mg via ORAL
  Filled 2014-08-28: qty 1

## 2014-08-28 MED ORDER — HYDROMORPHONE HCL 1 MG/ML IJ SOLN
INTRAMUSCULAR | Status: DC | PRN
  Administered 2014-08-28 (×2): 0.5 mg via INTRAVENOUS

## 2014-08-28 MED ORDER — SCOPOLAMINE 1 MG/3DAYS TD PT72
1.0000 | MEDICATED_PATCH | TRANSDERMAL | Status: DC
  Administered 2014-08-28: 1.5 mg via TRANSDERMAL
  Filled 2014-08-28: qty 1

## 2014-08-28 MED ORDER — HYDROMORPHONE HCL 1 MG/ML IJ SOLN
INTRAMUSCULAR | Status: AC
  Filled 2014-08-28: qty 1

## 2014-08-28 MED ORDER — SCOPOLAMINE 1 MG/3DAYS TD PT72
MEDICATED_PATCH | TRANSDERMAL | Status: AC
  Administered 2014-08-28: 1.5 mg via TRANSDERMAL
  Filled 2014-08-28: qty 1

## 2014-08-28 MED ORDER — MIDAZOLAM HCL 2 MG/2ML IJ SOLN
INTRAMUSCULAR | Status: AC
  Filled 2014-08-28: qty 2

## 2014-08-28 MED ORDER — HYDROMORPHONE HCL 1 MG/ML IJ SOLN
INTRAMUSCULAR | Status: AC
  Administered 2014-08-28: 0.25 mg via INTRAVENOUS
  Filled 2014-08-28: qty 1

## 2014-08-28 MED ORDER — OXYCODONE HCL 5 MG/5ML PO SOLN: 5.0000 mg | Freq: Once | ORAL | Status: AC | PRN

## 2014-08-28 MED ORDER — HYDROMORPHONE HCL 1 MG/ML IJ SOLN
INTRAMUSCULAR | Status: AC
  Administered 2014-08-28: 0.5 mg via INTRAVENOUS
  Filled 2014-08-28: qty 1

## 2014-08-28 MED ORDER — ACETAMINOPHEN 500 MG PO TABS
500.0000 mg | ORAL_TABLET | Freq: Four times a day (QID) | ORAL | Status: DC | PRN
  Administered 2014-08-28: 500 mg via ORAL
  Filled 2014-08-28: qty 1

## 2014-08-28 MED ORDER — DEXAMETHASONE SODIUM PHOSPHATE 4 MG/ML IJ SOLN
INTRAMUSCULAR | Status: AC
  Filled 2014-08-28: qty 2

## 2014-08-28 MED ORDER — DOCUSATE SODIUM 100 MG PO CAPS
100.0000 mg | ORAL_CAPSULE | Freq: Every day | ORAL | Status: DC
  Administered 2014-08-28 – 2014-08-29 (×2): 100 mg via ORAL
  Filled 2014-08-28 (×2): qty 1

## 2014-08-28 MED ORDER — FENTANYL CITRATE (PF) 100 MCG/2ML IJ SOLN
INTRAMUSCULAR | Status: DC | PRN
  Administered 2014-08-28 (×2): 50 ug via INTRAVENOUS
  Administered 2014-08-28: 100 ug via INTRAVENOUS
  Administered 2014-08-28: 50 ug via INTRAVENOUS

## 2014-08-28 MED ORDER — MIDAZOLAM HCL 5 MG/5ML IJ SOLN
INTRAMUSCULAR | Status: DC | PRN
Start: 2014-08-28 — End: 2014-08-28
  Administered 2014-08-28: 2 mg via INTRAVENOUS

## 2014-08-28 MED ORDER — HYDROMORPHONE HCL 1 MG/ML IJ SOLN
0.2500 mg | INTRAMUSCULAR | Status: DC | PRN
  Administered 2014-08-28 (×2): 0.5 mg via INTRAVENOUS
  Administered 2014-08-28: 0.25 mg via INTRAVENOUS

## 2014-08-28 MED ORDER — FENTANYL CITRATE (PF) 250 MCG/5ML IJ SOLN
INTRAMUSCULAR | Status: AC
  Filled 2014-08-28: qty 5

## 2014-08-28 MED ORDER — PROPOFOL 10 MG/ML IV BOLUS
INTRAVENOUS | Status: AC
  Filled 2014-08-28: qty 20

## 2014-08-28 MED ORDER — PROPOFOL 10 MG/ML IV BOLUS
INTRAVENOUS | Status: DC | PRN
  Administered 2014-08-28: 200 mg via INTRAVENOUS

## 2014-08-28 MED ORDER — HYDROMORPHONE HCL 2 MG PO TABS
2.0000 mg | ORAL_TABLET | ORAL | Status: DC | PRN
  Administered 2014-08-28 – 2014-08-29 (×3): 4 mg via ORAL
  Filled 2014-08-28 (×3): qty 2

## 2014-08-28 MED ORDER — ONDANSETRON HCL 4 MG/2ML IJ SOLN
INTRAMUSCULAR | Status: AC
  Filled 2014-08-28: qty 2

## 2014-08-28 MED ORDER — ONDANSETRON HCL 4 MG/2ML IJ SOLN: 4.0000 mg | Freq: Four times a day (QID) | INTRAMUSCULAR | Status: DC | PRN

## 2014-08-28 MED ORDER — CEFAZOLIN SODIUM-DEXTROSE 2-3 GM-% IV SOLR
INTRAVENOUS | Status: DC | PRN
  Administered 2014-08-28: 2 g via INTRAVENOUS

## 2014-08-28 MED ORDER — METHOCARBAMOL 500 MG PO TABS
ORAL_TABLET | ORAL | Status: AC
  Administered 2014-08-28: 500 mg via ORAL
  Filled 2014-08-28: qty 1

## 2014-08-28 MED ORDER — OXYCODONE HCL 5 MG PO TABS
5.0000 mg | ORAL_TABLET | Freq: Once | ORAL | Status: AC | PRN
  Administered 2014-08-28: 5 mg via ORAL

## 2014-08-28 MED ORDER — ENOXAPARIN SODIUM 40 MG/0.4ML ~~LOC~~ SOLN
40.0000 mg | SUBCUTANEOUS | Status: DC
  Administered 2014-08-29: 40 mg via SUBCUTANEOUS
  Filled 2014-08-28: qty 0.4

## 2014-08-28 MED ORDER — ROCURONIUM BROMIDE 50 MG/5ML IV SOLN
INTRAVENOUS | Status: AC
  Filled 2014-08-28: qty 1

## 2014-08-28 MED ORDER — LIDOCAINE HCL (CARDIAC) 20 MG/ML IV SOLN
INTRAVENOUS | Status: AC
  Filled 2014-08-28: qty 5

## 2014-08-28 MED ORDER — DEXAMETHASONE SODIUM PHOSPHATE 4 MG/ML IJ SOLN
INTRAMUSCULAR | Status: DC | PRN
  Administered 2014-08-28: 8 mg via INTRAVENOUS

## 2014-08-28 MED ORDER — PROMETHAZINE HCL 25 MG/ML IJ SOLN: 6.2500 mg | INTRAMUSCULAR | Status: DC | PRN

## 2014-08-28 MED ORDER — LIDOCAINE HCL (CARDIAC) 20 MG/ML IV SOLN
INTRAVENOUS | Status: DC | PRN
  Administered 2014-08-28: 80 mg via INTRAVENOUS

## 2014-08-28 MED ORDER — ROCURONIUM BROMIDE 100 MG/10ML IV SOLN
INTRAVENOUS | Status: DC | PRN
  Administered 2014-08-28: 40 mg via INTRAVENOUS

## 2014-08-28 SURGICAL SUPPLY — 42 items
ATCH SMKEVC FLXB CAUT HNDSWH (FILTER) ×1 IMPLANT
BINDER BREAST LRG (GAUZE/BANDAGES/DRESSINGS) IMPLANT
BINDER BREAST XLRG (GAUZE/BANDAGES/DRESSINGS) ×2 IMPLANT
BLADE 10 SAFETY STRL DISP (BLADE) ×2 IMPLANT
CANISTER SUCTION 2500CC (MISCELLANEOUS) ×2 IMPLANT
CHLORAPREP W/TINT 26ML (MISCELLANEOUS) ×4 IMPLANT
COVER SURGICAL LIGHT HANDLE (MISCELLANEOUS) ×2 IMPLANT
DERMABOND ADVANCED (GAUZE/BANDAGES/DRESSINGS) ×3
DERMABOND ADVANCED .7 DNX12 (GAUZE/BANDAGES/DRESSINGS) ×3 IMPLANT
DRAPE ORTHO SPLIT 77X108 STRL (DRAPES) ×2
DRAPE PROXIMA HALF (DRAPES) ×4 IMPLANT
DRAPE SURG ORHT 6 SPLT 77X108 (DRAPES) ×2 IMPLANT
DRAPE WARM FLUID 44X44 (DRAPE) ×2 IMPLANT
DRSG PAD ABDOMINAL 8X10 ST (GAUZE/BANDAGES/DRESSINGS) ×8 IMPLANT
ELECT CAUTERY BLADE 6.4 (BLADE) ×2 IMPLANT
ELECT REM PT RETURN 9FT ADLT (ELECTROSURGICAL) ×2
ELECTRODE REM PT RTRN 9FT ADLT (ELECTROSURGICAL) ×1 IMPLANT
EVACUATOR SMOKE ACCUVAC VALLEY (FILTER) ×1
GAUZE SPONGE 4X4 12PLY STRL (GAUZE/BANDAGES/DRESSINGS) ×4 IMPLANT
GLOVE BIO SURGEON STRL SZ7.5 (GLOVE) ×4 IMPLANT
GLOVE BIOGEL PI IND STRL 8 (GLOVE) ×2 IMPLANT
GLOVE BIOGEL PI INDICATOR 8 (GLOVE) ×2
GOWN STRL REUS W/ TWL LRG LVL3 (GOWN DISPOSABLE) ×1 IMPLANT
GOWN STRL REUS W/ TWL XL LVL3 (GOWN DISPOSABLE) ×1 IMPLANT
GOWN STRL REUS W/TWL LRG LVL3 (GOWN DISPOSABLE) ×1
GOWN STRL REUS W/TWL XL LVL3 (GOWN DISPOSABLE) ×1
KIT BASIN OR (CUSTOM PROCEDURE TRAY) ×2 IMPLANT
KIT ROOM TURNOVER OR (KITS) ×2 IMPLANT
MARKER SKIN DUAL TIP RULER LAB (MISCELLANEOUS) ×2 IMPLANT
NS IRRIG 1000ML POUR BTL (IV SOLUTION) ×4 IMPLANT
PACK GENERAL/GYN (CUSTOM PROCEDURE TRAY) ×2 IMPLANT
PAD ARMBOARD 7.5X6 YLW CONV (MISCELLANEOUS) ×2 IMPLANT
PREFILTER EVAC NS 1 1/3-3/8IN (MISCELLANEOUS) ×2 IMPLANT
SPONGE GAUZE 4X4 12PLY STER LF (GAUZE/BANDAGES/DRESSINGS) ×2 IMPLANT
SPONGE LAP 18X18 X RAY DECT (DISPOSABLE) IMPLANT
STRIP CLOSURE SKIN 1/2X4 (GAUZE/BANDAGES/DRESSINGS) IMPLANT
SUT MNCRL AB 3-0 PS2 18 (SUTURE) ×14 IMPLANT
SUT MON AB 2-0 CT1 36 (SUTURE) ×4 IMPLANT
SUT PROLENE 5 0 PS 2 (SUTURE) ×10 IMPLANT
TOWEL OR 17X24 6PK STRL BLUE (TOWEL DISPOSABLE) ×2 IMPLANT
TOWEL OR 17X26 10 PK STRL BLUE (TOWEL DISPOSABLE) ×2 IMPLANT
TUBE CONNECTING 12X1/4 (SUCTIONS) ×2 IMPLANT

## 2014-08-28 NOTE — H&P (Signed)
I have re-examined and re-evaluated the patient and there are no changes.  See office notes in paper chart for H&P.  Planned Procedure: Bilateral Breast Reduction

## 2014-08-28 NOTE — Op Note (Signed)
Dawn [name], GOMM NO.:  [id number]  MEDICAL RECORD NO.:  [id number]  LOCATION:  6N22C                        FACILITY:  [facility]  PHYSICIAN:  Etter Sjogren, M.D.     DATE OF BIRTH:  Feb 24, 1980  DATE OF PROCEDURE:  08/28/2014 DATE OF DISCHARGE:                              OPERATIVE REPORT   PREOPERATIVE DIAGNOSIS:  Bilateral macromastia with upper back pain, neck pain, shoulder pain.  POSTOPERATIVE DIAGNOSIS:  Bilateral macromastia with upper back pain, neck pain, shoulder pain.  PROCEDURE PERFORMED:  Bilateral breast reduction.  SURGEON:  Etter Sjogren, MD.  ASSISTANT:  Myrtie Soman, RNFA.  ANESTHESIA:  General.  ESTIMATED BLOOD LOSS:  100 mL.  CLINICAL NOTE:  A 34 year old woman presented with a complaint of very large breasts with upper back pain, neck pain, shoulder pain, and bra strap shoulder grooving.  She desired breast reduction.  She went to go down to approximately half of her breast size if possible.  Next, procedure and risks plus complications were discussed with her in detail.  These risks include, but are not limited to bleeding, infection, healing problems, scarring, loss of sensation, fluid accumulations, anesthesia complications, loss of sensation, loss of sensation in nipple, loss of tissue, loss of nipple, loss of skin, loss of fatty tissue, asymmetry, failure to relieve symptoms, negative body image, inability to breast feed, and chronic pain, as well as overall disappointment.  She understood all this as well as the possibility of loss of pigment especially in the nipple-areolar complexes.  She wished to proceed.  DESCRIPTION OF PROCEDURE:  The patient has marked in full standing position in the holding area.  She was taken to the operating room and placed supine.  After successful induction of general anesthesia she was prepped with ChloraPrep and after waiting a full 3 minutes for drying, she was draped with sterile  drapes.  The 42 mm marker marked the nipple- areolar complex.  An 8 cm wide inferior nipple-areolar pedicles were designed.  The incisions were made.  The inferior pedicles were de- epithelialized and they were isolated from surrounding tissues beveling well outward both medial and lateral in order to ensure a broader attachment of tissue at the level of chest wall, which was broader at the level of the skin.  This was done to preserve blood flow to the nipple-areolar complexes bilateral.  The resections were performed medial, central, and lateral and total of 901 g from the left side was resected and 907 g from the right side.  Meticulous hemostasis was achieved using electrocautery.  Thorough irrigation with saline and again hemostasis with electrocautery.  With hemostasis having been confirmed bilateral, nipple complexes were inspected and found to have excellent color and bright red bleeding along the periphery consistent viability.  The closures with 2-0 Monocryl and 3-0 Monocryl in inverted deep dermal sutures and running 3-0 Monocryl subcuticular suture.  The measurement was then taken 5 cm up from the inframammary crease and the 42 mm marker marked the site for the nipple-areolar complex.  This tissue was resected and irrigation with saline was performed. Hemostasis with electrocautery.  Nipple-areolar complex was brought through this opening and were again  inspected and found to have excellent color and again had bright red bleeding around the periphery consistent with viability.  Nipple-areolar insetting with 3-0 Monocryl interrupted in inverted deep dermal sutures and running 4-0 Monocryl subcuticular suture.  Dermabond, dry sterile dressings, circumferential Ace wrap, and a chest vest were applied and she was transferred to recovery room.  She tolerated the procedure well.     Etter Sjogren, M.D.     DB/MEDQ  D:  08/28/2014  T:  08/28/2014  Job:  [id number]

## 2014-08-28 NOTE — Progress Notes (Signed)
Patient arrived via hospital bed from the PACU.  Pt A&O x 4.  Family at bedside.  Minimal surgical pain at this time.  Pt already ambulated with minimal assist to the bathroom.  Oriented pt to Room and Dept 6 [place].  [place] admit packet given to patient.  Will cont to monitor

## 2014-08-28 NOTE — Transfer of Care (Signed)
Immediate Anesthesia Transfer of Care Note  Patient: Dawn [name]  Procedure(s) Performed: Procedure(s): BILATERAL BREAST  REDUCTION   (Bilateral)  Patient Location: PACU  Anesthesia Type:General  Level of Consciousness: awake, alert , oriented and patient cooperative  Airway & Oxygen Therapy: Patient Spontanous Breathing and Patient connected to nasal cannula oxygen  Post-op Assessment: Report given to RN and Post -op Vital signs reviewed and stable  Post vital signs: Reviewed and stable  Last Vitals:  Filed Vitals:   08/28/14 1120  BP:   Pulse:   Temp: 37.1 C  Resp:     Complications: No apparent anesthesia complications

## 2014-08-28 NOTE — Anesthesia Preprocedure Evaluation (Addendum)
Anesthesia Evaluation  Patient identified by MRN, date of birth, ID band Patient awake    Reviewed: Allergy & Precautions, NPO status , Patient's Chart, lab work & pertinent test results  History of Anesthesia Complications (+) PONV  Airway Mallampati: I  TM Distance: >3 FB Neck ROM: Full    Dental  (+) Teeth Intact, Dental Advisory Given   Pulmonary  breath sounds clear to auscultation        Cardiovascular negative cardio ROS  Rhythm:regular Rate:Normal     Neuro/Psych  Headaches,    GI/Hepatic GERD-  ,  Endo/Other  Morbid obesity  Renal/GU      Musculoskeletal   Abdominal   Peds  Hematology   Anesthesia Other Findings   Reproductive/Obstetrics                           Anesthesia Physical Anesthesia Plan  ASA: II  Anesthesia Plan: General   Post-op Pain Management:    Induction: Intravenous  Airway Management Planned: Oral ETT  Additional Equipment:   Intra-op Plan:   Post-operative Plan: Extubation in OR  Informed Consent: I have reviewed the patients History and Physical, chart, labs and discussed the procedure including the risks, benefits and alternatives for the proposed anesthesia with the patient or authorized representative who has indicated his/her understanding and acceptance.     Plan Discussed with: CRNA, Anesthesiologist and Surgeon  Anesthesia Plan Comments:         Anesthesia Quick Evaluation

## 2014-08-28 NOTE — Brief Op Note (Signed)
08/28/2014  10:58 AM  PATIENT:  Dawn [name]  34 y.o. female  PRE-OPERATIVE DIAGNOSIS:  BREAST HYPERTROPHIC   POST-OPERATIVE DIAGNOSIS:  same  PROCEDURE:  Procedure(s): BILATERAL BREAST  REDUCTION   (Bilateral)  SURGEON:  Surgeon(s) and Role:    * Etter Sjogren, MD - Primary  PHYSICIAN ASSISTANT:   ASSISTANTS: Myrtie Soman, RNFA   ANESTHESIA:   general  EBL:  Total I/O In: -  Out: 60 [Blood:60]  BLOOD ADMINISTERED:none  DRAINS: none   LOCAL MEDICATIONS USED:  NONE  SPECIMEN:  Source of Specimen:  Bilateral breast tissue  DISPOSITION OF SPECIMEN:  PATHOLOGY  COUNTS:  YES  TOURNIQUET:  * No tourniquets in log *  DICTATION: .Other Dictation: Dictation Number [id number]  PLAN OF CARE: Admit for overnight observation  PATIENT DISPOSITION:  PACU - hemodynamically stable.   Delay start of Pharmacological VTE agent (>24hrs) due to surgical blood loss or risk of bleeding: no

## 2014-08-28 NOTE — Anesthesia Postprocedure Evaluation (Signed)
Anesthesia Post Note  Patient: Dawn [name]  Procedure(s) Performed: Procedure(s) (LRB): BILATERAL BREAST  REDUCTION   (Bilateral)  Anesthesia type: General  Patient location: PACU  Post pain: Pain level controlled and Adequate analgesia  Post assessment: Post-op Vital signs reviewed, Patient's Cardiovascular Status Stable, Respiratory Function Stable, Patent Airway and Pain level controlled  Last Vitals:  Filed Vitals:   08/28/14 1231  BP: 133/78  Pulse: 96  Temp: 36.7 C  Resp: 22    Post vital signs: Reviewed and stable  Level of consciousness: awake, alert  and oriented  Complications: No apparent anesthesia complications

## 2014-08-29 ENCOUNTER — Encounter (HOSPITAL_COMMUNITY): Payer: Self-pay | Admitting: Plastic Surgery

## 2014-08-29 DIAGNOSIS — N62 Hypertrophy of breast: Secondary | ICD-10-CM | POA: Diagnosis not present

## 2014-08-29 MED ORDER — DOCUSATE SODIUM 100 MG PO CAPS: 100.0000 mg | ORAL_CAPSULE | Freq: Every day | ORAL | Status: DC

## 2014-08-29 MED ORDER — METHOCARBAMOL 500 MG PO TABS: 500.0000 mg | ORAL_TABLET | Freq: Four times a day (QID) | ORAL | Status: DC

## 2014-08-29 MED ORDER — HYDROMORPHONE HCL 2 MG PO TABS: 2.0000 mg | ORAL_TABLET | ORAL | Status: DC | PRN

## 2014-08-29 NOTE — Discharge Summary (Signed)
Physician Discharge Summary  Patient ID: Dawn [name] MRN: [id number] DOB/AGE: [birth year] 34 y.o.  Admit date: 08/28/2014 Discharge date: 08/29/2014  Admission Diagnoses: Macromastia  Discharge Diagnoses: Same Active Problems:   Breast hypertrophy in female   Discharged Condition: good  Hospital Course: On the day of admission the patient was taken to surgery and had bilateral breast reduction. The patient tolerated the procedures well. Postoperatively, the nipple complexes maintained excellent color and capillary refill. The patient was ambulatory and tolerating diet on the first postoperative day. She is ready for discharge.  Treatments: antibiotics: Ancef, anticoagulation: LMW heparin and surgery: bilateral breast reduction  Discharge Exam: Blood pressure 116/69, pulse 79, temperature 98.1 F (36.7 C), temperature source Oral, resp. rate 17, height  (1.676 m), weight 242 lb (109.77 kg), last menstrual period 05/07/2014, SpO2 98 %.  Operative sites: Nipple complexes have good color and are viable. There is no evidence of bleeding or infection on either side.  Disposition: 01-Home or Self Care     Medication List    STOP taking these medications        multivitamin with minerals tablet     Oxycodone HCl 10 MG Tabs      TAKE these medications        B-12 PO  Take 1 tablet by mouth daily.     cholecalciferol 1000 UNITS tablet  Commonly known as:  VITAMIN D  Take 2,000 Units by mouth daily.     docusate sodium 100 MG capsule  Commonly known as:  COLACE  Take 1 capsule (100 mg total) by mouth daily.     HYDROmorphone 2 MG tablet  Commonly known as:  DILAUDID  Take 1-2 tablets (2-4 mg total) by mouth every 4 (four) hours as needed for moderate pain or severe pain.     IRON PO  Take 1 tablet by mouth daily.     methocarbamol 500 MG tablet  Commonly known as:  ROBAXIN  Take 1 tablet (500 mg total) by mouth 4 (four) times daily.         SignedOdis Luster,  [name] 08/29/2014, 8:51 AM

## 2014-08-29 NOTE — Discharge Instructions (Addendum)
No lifting for 6 weeks No vigorous activity for 6 weeks (including outdoor walks) No driving for 4 weeks OK to walk up stairs slowly Stay propped up Use incentive spirometer at home every hour while awake No shower until Sunday It is okay to remove all dressings and shower on Sunday. Be gentle with the breasts. No scrubbing or soap yet. After shower, place pad in the crease under the breasts to prevent irritation of incisions (sanitary napkin works well) Take an over-the-counter stool softener (such as Colace) while on pain medication See Dr. Odis Luster in office next week For questions call [phone] or [phone]

## 2014-08-29 NOTE — Progress Notes (Signed)
Didchsrge home. Home discharge instruction given, no question verbalized.

## 2014-09-25 ENCOUNTER — Emergency Department (HOSPITAL_COMMUNITY)
Admission: EM | Admit: 2014-09-25 | Discharge: 2014-09-25 | Disposition: A | Discharge status: Home / Self Care | Payer: 59 | Attending: Emergency Medicine | Admitting: Emergency Medicine

## 2014-09-25 ENCOUNTER — Encounter (HOSPITAL_COMMUNITY): Payer: Self-pay | Admitting: Emergency Medicine

## 2014-09-25 DIAGNOSIS — J0100 Acute maxillary sinusitis, unspecified: Secondary | ICD-10-CM | POA: Diagnosis not present

## 2014-09-25 DIAGNOSIS — R51 Headache: Secondary | ICD-10-CM | POA: Diagnosis present

## 2014-09-25 DIAGNOSIS — Z79899 Other long term (current) drug therapy: Secondary | ICD-10-CM | POA: Diagnosis not present

## 2014-09-25 MED ORDER — FEXOFENADINE-PSEUDOEPHED ER 60-120 MG PO TB12: 1.0000 | ORAL_TABLET | Freq: Two times a day (BID) | ORAL | Status: DC

## 2014-09-25 NOTE — ED Provider Notes (Signed)
CSN: [id number]     Arrival date & time 09/25/14  2241 History  This chart was scribed for non-physician practitioner, Arman Filter, NP, working with Tomasita Crumble, MD, by Budd Palmer ED Scribe. This patient was seen in room WTR6/WTR6 and the patient's care was started at 11:18 PM    Chief Complaint  Patient presents with  . Facial Pain   The history is provided by the patient. No language interpreter was used.   HPI Comments: Dawn [name] is a 34 y.o. female who presents to the Emergency Department complaining of facial pain onset 1 week ago. She reports associated HA, congestion, and rhinorrhea. She has been taking nasal decongestant and Alegra with some relief. Her LNMP was in May. She has a PSHX of breast reduction on 7/21. She denies any discoloration to her nasal discharge and fever.  Past Medical History  Diagnosis Date  . Vaginal delivery 2004  . PONV (postoperative nausea and vomiting)    Past Surgical History  Procedure Laterality Date  . Laparoscopy N/A 02/18/2013    Procedure: LAPAROSCOPY OPERATIVE;  Surgeon: Levi Aland, MD;  Location: [facility]S;  Service: Gynecology;  Laterality: N/A;  YAG LASER  . Yag laser application N/A 02/18/2013    Procedure: YAG LASER APPLICATION;  Surgeon: Levi Aland, MD;  Location: [facility];  Service: Gynecology;  Laterality: N/A;  . Reduction mammaplasty  08/28/2014  . Tonsillectomy  2008  . Vaginal hysterectomy  06-26-14  . Breast reduction surgery Bilateral 08/28/2014    Procedure: BILATERAL BREAST  REDUCTION  ;  Surgeon: Etter Sjogren, MD;  [facility]: [facility] OR;  Service: Plastics;  Laterality: Bilateral;   Family History  Problem Relation Age of Onset  . Hyperlipidemia Mother   . Hypertension Mother    Social History  Substance Use Topics  . Smoking status: Never Smoker   . Smokeless tobacco: Never Used  . Alcohol Use: No   OB History    Gravida Para Term Preterm AB TAB SAB Ectopic Multiple Living   3 1 1  2  2   1      Review of  Systems  Constitutional: Negative for fever and chills.  HENT: Positive for congestion and sinus pressure.   Eyes: Negative for pain.  Respiratory: Negative for cough and shortness of breath.   Neurological: Positive for dizziness and headaches.  All other systems reviewed and are negative.   Allergies  Claritin  Home Medications   Prior to Admission medications   Medication Sig Start Date End Date Taking? Authorizing Provider  cholecalciferol (VITAMIN D) 1000 UNITS tablet Take 2,000 Units by mouth daily.    Historical Provider, MD  Cyanocobalamin (B-12 PO) Take 1 tablet by mouth daily.    Historical Provider, MD  docusate sodium (COLACE) 100 MG capsule Take 1 capsule (100 mg total) by mouth daily. 08/29/14   Etter Sjogren, MD  HYDROmorphone (DILAUDID) 2 MG tablet Take 1-2 tablets (2-4 mg total) by mouth every 4 (four) hours as needed for moderate pain or severe pain. 08/29/14   Etter Sjogren, MD  IRON PO Take 1 tablet by mouth daily.    Historical Provider, MD  methocarbamol (ROBAXIN) 500 MG tablet Take 1 tablet (500 mg total) by mouth 4 (four) times daily. 08/29/14   Etter Sjogren, MD   BP 136/78 mmHg  Pulse 96  Temp(Src) 98.7 F (37.1 C) (Oral)  Resp 20  SpO2 98%  LMP 06/25/2014 (Approximate) Physical Exam  Constitutional: She is oriented to person,  place, and time. She appears well-developed and well-nourished.  HENT:  Head: Normocephalic.  Right Ear: External ear normal.  Left Ear: External ear normal.  Nose: Right sinus exhibits maxillary sinus tenderness and frontal sinus tenderness. Left sinus exhibits maxillary sinus tenderness and frontal sinus tenderness.  Mouth/Throat: Oropharynx is clear and moist.  Eyes: Pupils are equal, round, and reactive to light.  Cardiovascular: Normal rate.   Pulmonary/Chest: Effort normal.  Musculoskeletal: Normal range of motion.  Neurological: She is alert and oriented to person, place, and time.  Skin: Skin is warm.  Nursing note and  vitals reviewed.   ED Course  Procedures  DIAGNOSTIC STUDIES: Oxygen Saturation is 98% on RA, normal by my interpretation.    COORDINATION OF CARE: 11:20 PM - Discussed plans to order Alegra D. Pt advised of plan for treatment and pt agrees.  Labs Review Labs Reviewed - No data to display  Imaging Review No results found. I have personally reviewed and evaluated these images and lab results as part of my medical decision-making.   EKG Interpretation None     Increase her Allegra to Allegra-D to help with the congestion.  Recommend she follow-up with her primary care physician.  If she continues to have symptoms as the 10 day period that she's been prescribed medication MDM   Final diagnoses:  None    I personally performed the services described in this documentation, which was scribed in my presence. The recorded information has been reviewed and is accurate.  Earley Favor, NP 09/25/14 1610  Tomasita Crumble, MD 09/26/14 [phone]

## 2014-09-25 NOTE — Discharge Instructions (Signed)
At LAD congestion to your Allegra.  Please take this on a regular basis for your symptoms.  If you develop fever headache, that will not stop despite the medication, then please make an appointment with your primary care physician for further evaluation

## 2014-09-25 NOTE — ED Notes (Signed)
Pt arrived to the ED with a compliant of sinus pain.  Pt states she has had a headache for a week off and on.  Pt states that today pain centered around her nose and eyes.  Pt also has been experiencing nasal drainage.

## 2014-11-07 ENCOUNTER — Emergency Department (HOSPITAL_COMMUNITY)
Admission: EM | Admit: 2014-11-07 | Discharge: 2014-11-07 | Disposition: A | Discharge status: Home / Self Care | Payer: 59 | Attending: Emergency Medicine | Admitting: Emergency Medicine

## 2014-11-07 ENCOUNTER — Encounter (HOSPITAL_COMMUNITY): Payer: Self-pay | Admitting: Emergency Medicine

## 2014-11-07 DIAGNOSIS — Z79899 Other long term (current) drug therapy: Secondary | ICD-10-CM | POA: Diagnosis not present

## 2014-11-07 DIAGNOSIS — R6883 Chills (without fever): Secondary | ICD-10-CM | POA: Insufficient documentation

## 2014-11-07 DIAGNOSIS — R51 Headache: Secondary | ICD-10-CM | POA: Insufficient documentation

## 2014-11-07 DIAGNOSIS — J0100 Acute maxillary sinusitis, unspecified: Secondary | ICD-10-CM | POA: Diagnosis not present

## 2014-11-07 DIAGNOSIS — J029 Acute pharyngitis, unspecified: Secondary | ICD-10-CM | POA: Diagnosis present

## 2014-11-07 MED ORDER — AMOXICILLIN 500 MG PO CAPS: 1000.0000 mg | ORAL_CAPSULE | Freq: Two times a day (BID) | ORAL | Status: DC

## 2014-11-07 MED ORDER — TRIAMCINOLONE ACETONIDE 55 MCG/ACT NA AERO: 2.0000 | INHALATION_SPRAY | Freq: Every day | NASAL | Status: DC

## 2014-11-07 NOTE — ED Notes (Signed)
Pt states she has had cold sxs all week  Pt states she now has a sore throat that started yesterday and a cough that started this morning

## 2014-11-07 NOTE — Discharge Instructions (Signed)

## 2014-11-07 NOTE — ED Provider Notes (Signed)
CSN: [id number]     Arrival date & time 11/07/14  7829 History   First MD Initiated Contact with Patient 11/07/14 [phone]     Chief Complaint  Patient presents with  . Sore Throat  . URI     (Consider location/radiation/quality/duration/timing/severity/associated sxs/prior Treatment) HPI Comments: Symptoms of congestion, sore throat, low grade temperature, headache and cough for the past one week. No vomiting. She is not sure what her temperature was during the week but reports hot/cold chills. She has been taking Allegra-D for symptomatic relief with decreasing efficacy. She has not seen her PCP but has a pending appointment.   The history is provided by the patient. No language interpreter was used.    Past Medical History  Diagnosis Date  . Vaginal delivery 2004  . PONV (postoperative nausea and vomiting)    Past Surgical History  Procedure Laterality Date  . Laparoscopy N/A 02/18/2013    Procedure: LAPAROSCOPY OPERATIVE;  Surgeon: Levi Aland, MD;  Location: [facility];  Service: Gynecology;  Laterality: N/A;  YAG LASER  . Yag laser application N/A 02/18/2013    Procedure: YAG LASER APPLICATION;  Surgeon: Levi Aland, MD;  Location: [facility];  Service: Gynecology;  Laterality: N/A;  . Reduction mammaplasty  08/28/2014  . Tonsillectomy  2008  . Vaginal hysterectomy  06-26-14  . Breast reduction surgery Bilateral 08/28/2014    Procedure: BILATERAL BREAST  REDUCTION  ;  Surgeon: Etter Sjogren, MD;  Location: [facility] OR;  Service: Plastics;  Laterality: Bilateral;   Family History  Problem Relation Age of Onset  . Hyperlipidemia Mother   . Hypertension Mother    Social History  Substance Use Topics  . Smoking status: Never Smoker   . Smokeless tobacco: Never Used  . Alcohol Use: No   OB History    Gravida Para Term Preterm AB TAB SAB Ectopic Multiple Living   Review of Systems  Constitutional: Positive for chills.  HENT: Positive for congestion, facial  swelling, rhinorrhea, sinus pressure and sore throat.   Respiratory: Positive for cough. Negative for shortness of breath.   Cardiovascular: Negative for chest pain.  Gastrointestinal: Negative for nausea, vomiting and abdominal pain.  Musculoskeletal: Negative for neck stiffness.  Skin: Negative for rash.  Neurological: Positive for headaches.      Allergies  Claritin  Home Medications   Prior to Admission medications   Medication Sig Start Date End Date Taking? Authorizing Liberta Gimpel  cholecalciferol (VITAMIN D) 1000 UNITS tablet Take 2,000 Units by mouth daily.   Yes Historical Wreatha Sturgeon, MD  Cyanocobalamin (B-12 PO) Take 1 tablet by mouth daily.   Yes Historical Bronx Brogden, MD  fexofenadine-pseudoephedrine (ALLEGRA-D) 60-120 MG per tablet Take 1 tablet by mouth every 12 (twelve) hours. 09/25/14  Yes Earley Favor, NP  IRON PO Take 1 tablet by mouth daily.   Yes Historical Kevork Joyce, MD  docusate sodium (COLACE) 100 MG capsule Take 1 capsule (100 mg total) by mouth daily. Patient not taking: Reported on 11/07/2014 08/29/14   Etter Sjogren, MD  HYDROmorphone (DILAUDID) 2 MG tablet Take 1-2 tablets (2-4 mg total) by mouth every 4 (four) hours as needed for moderate pain or severe pain. Patient not taking: Reported on 11/07/2014 08/29/14   Etter Sjogren, MD  methocarbamol (ROBAXIN) 500 MG tablet Take 1 tablet (500 mg total) by mouth 4 (four) times daily. Patient not taking: Reported on 11/07/2014 08/29/14   Etter Sjogren, MD  BP 134/73 mmHg  Pulse 104  Temp(Src) 98.1 F (36.7 C) (Oral)  Resp 20  Ht  (1.676 m)  Wt 243 lb (110.224 kg)  BMI 39.24 kg/m2  SpO2 97%  LMP 06/25/2014 (Approximate) Physical Exam  Constitutional: She appears well-developed and well-nourished.  HENT:  Head: Normocephalic.  Right Ear: External ear normal.  Left Ear: External ear normal.  Nose: Mucosal edema present. Right sinus exhibits maxillary sinus tenderness. Left sinus exhibits maxillary sinus tenderness.   Mouth/Throat: Oropharynx is clear and moist.  Neck: Normal range of motion. Neck supple.  Cardiovascular: Normal rate and normal heart sounds.   No murmur heard. Pulmonary/Chest: Effort normal and breath sounds normal. She has no wheezes. She has no rales.  Abdominal: Soft. Bowel sounds are normal. She exhibits no distension. There is no tenderness.  Musculoskeletal: Normal range of motion.  Lymphadenopathy:    She has no cervical adenopathy.  Skin: Skin is warm and dry. No pallor.    ED Course  Procedures (including critical care time) Labs Review Labs Reviewed - No data to display  Imaging Review No results found. I have personally reviewed and evaluated these images and lab results as part of my medical decision-making.   EKG Interpretation None      MDM   Final diagnoses:  None    1. Acute sinusitis  Will opt to treat with abx given duration of symptoms and presentation for uncomplicated sinusitis.     Elpidio Anis, PA-C 11/10/14 1610  Tomasita Crumble, MD 11/11/14 [phone]

## 2015-01-05 ENCOUNTER — Encounter: Payer: Self-pay | Admitting: Certified Nurse Midwife

## 2015-01-05 ENCOUNTER — Ambulatory Visit (INDEPENDENT_AMBULATORY_CARE_PROVIDER_SITE_OTHER): Payer: 59 | Admitting: Certified Nurse Midwife

## 2015-01-05 VITALS — BP 121/81 | HR 76 | Temp 97.5°F | Ht 66.0 in | Wt 250.0 lb

## 2015-01-05 DIAGNOSIS — N898 Other specified noninflammatory disorders of vagina: Secondary | ICD-10-CM

## 2015-01-05 DIAGNOSIS — B373 Candidiasis of vulva and vagina: Secondary | ICD-10-CM

## 2015-01-05 DIAGNOSIS — B3731 Acute candidiasis of vulva and vagina: Secondary | ICD-10-CM

## 2015-01-05 MED ORDER — TERCONAZOLE 0.4 % VA CREA: 1.0000 | TOPICAL_CREAM | Freq: Every day | VAGINAL | Status: DC

## 2015-01-05 MED ORDER — FLUCONAZOLE 100 MG PO TABS: 100.0000 mg | ORAL_TABLET | Freq: Once | ORAL | Status: DC

## 2015-01-06 NOTE — Progress Notes (Signed)
Patient ID: Dawn [name], female   DOB: 1981-01-01, 33 y.o.   MRN: [id number]   Chief Complaint  Patient presents with  . vaginal irritation    HPI Dawn [name] is a 34 y.o. female.  Here for vaginal irritation with itching, denies any odor to her vaginal discharge.  Was recently on antibiotics for a sinus infection.  Has not tried anything for the irritation.   Had breast reduction and total hysterectomy several months ago and is very happy with the results for both.  States that she has gained some weight from being inactive, but overall very happy with the results and doing much better.  Her back pain is gone.   HPI  Past Medical History  Diagnosis Date  . Vaginal delivery 2004  . PONV (postoperative nausea and vomiting)     Past Surgical History  Procedure Laterality Date  . Laparoscopy N/A 02/18/2013    Procedure: LAPAROSCOPY OPERATIVE;  Surgeon: Levi Aland, MD;  Location: [facility];  Service: Gynecology;  Laterality: N/A;  YAG LASER  . Yag laser application N/A 02/18/2013    Procedure: YAG LASER APPLICATION;  Surgeon: Levi Aland, MD;  Location: [facility];  Service: Gynecology;  Laterality: N/A;  . Reduction mammaplasty  08/28/2014  . Tonsillectomy  2008  . Vaginal hysterectomy  06-26-14  . Breast reduction surgery Bilateral 08/28/2014    Procedure: BILATERAL BREAST  REDUCTION  ;  Surgeon: Etter Sjogren, MD;  Location: [facility] OR;  Service: Plastics;  Laterality: Bilateral;    Family History  Problem Relation Age of Onset  . Hyperlipidemia Mother   . Hypertension Mother     Social History Social History  Substance Use Topics  . Smoking status: Never Smoker   . Smokeless tobacco: Never Used  . Alcohol Use: No    Allergies  Allergen Reactions  . Claritin [Loratadine] Other (See Comments)    Tremors    Current Outpatient Prescriptions  Medication Sig Dispense Refill  . fluconazole (DIFLUCAN) 100 MG tablet Take 1 tablet (100 mg total) by mouth once. Repeat dose in 48-72  hour. 3 tablet 0  . terconazole (TERAZOL 7) 0.4 % vaginal cream Place 1 applicator vaginally at bedtime. 45 g 0  . [DISCONTINUED] diphenhydrAMINE (BENADRYL) 25 MG tablet Take 1 tablet (25 mg total) by mouth every 8 (eight) hours as needed for itching. 9 tablet 0   No current facility-administered medications for this visit.    Review of Systems Review of Systems Constitutional: negative for fatigue and weight loss Respiratory: negative for cough and wheezing Cardiovascular: negative for chest pain, fatigue and palpitations Gastrointestinal: negative for abdominal pain and change in bowel habits Genitourinary:+ vaginal discharge, + pruitius Integument/breast: negative for nipple discharge Musculoskeletal:negative for myalgias Neurological: negative for gait problems and tremors Behavioral/Psych: negative for abusive relationship, depression Endocrine: negative for temperature intolerance     Blood pressure 121/81, pulse 76, temperature 97.5 F (36.4 C), height  (1.676 m), weight 250 lb (113.399 kg), last menstrual period 06/25/2014.  Physical Exam Physical Exam General:   alert  Skin:   no rash or abnormalities  Lungs:   clear to auscultation bilaterally  Heart:   regular rate and rhythm, S1, S2 normal, no murmur, click, rub or gallop  Breasts:   normal without suspicious masses, skin or nipple changes or axillary nodes, breast scars are healing nicely  Abdomen:  normal findings: no organomegaly, soft, non-tender and no hernia  Pelvis:  External genitalia: normal general appearance Urinary system:  urethral meatus normal and bladder without fullness, nontender Vaginal: normal without tenderness, induration or masses, + white chunky discharge Cervix: surgically absent Adnexa: normal bimanual exam Uterus: surgically absent    50% of 15 min visit spent on counseling and coordination of care.   Data Reviewed Previous medical hx, labs, meds  Assessment     Vulvovaginal  candidasis     Plan    Orders Placed This Encounter  Procedures  . SureSwab Bacterial Vaginosis/itis   Meds ordered this encounter  Medications  . fluconazole (DIFLUCAN) 100 MG tablet    Sig: Take 1 tablet (100 mg total) by mouth once. Repeat dose in 48-72 hour.    Dispense:  3 tablet    Refill:  0  . terconazole (TERAZOL 7) 0.4 % vaginal cream    Sig: Place 1 applicator vaginally at bedtime.    Dispense:  45 g    Refill:  0    Follow up as needed or for annual exam.

## 2015-01-07 LAB — SURESWAB BACTERIAL VAGINOSIS/ITIS
Atopobium vaginae: NOT DETECTED Log (cells/mL)
C. GLABRATA, DNA: NOT DETECTED
C. PARAPSILOSIS, DNA: NOT DETECTED
C. TROPICALIS, DNA: NOT DETECTED
C. albicans, DNA: NOT DETECTED
LACTOBACILLUS SPECIES: >8 Log (cells/mL)
MEGASPHAERA SPECIES: NOT DETECTED Log (cells/mL)
T. vaginalis RNA, QL TMA: NOT DETECTED

## 2015-01-09 ENCOUNTER — Telehealth: Payer: Self-pay | Admitting: *Deleted

## 2015-01-09 NOTE — Telephone Encounter (Signed)
Pt called to office to check lab results. Return call to pt. Made pt aware lab results are normal.

## 2015-05-25 ENCOUNTER — Observation Stay (HOSPITAL_COMMUNITY): Payer: 59

## 2015-05-25 ENCOUNTER — Observation Stay (HOSPITAL_COMMUNITY)
Admission: EM | Admit: 2015-05-25 | Discharge: 2015-05-27 | Disposition: A | Discharge status: Home / Self Care | Payer: 59 | Attending: Internal Medicine | Admitting: Internal Medicine

## 2015-05-25 ENCOUNTER — Encounter (HOSPITAL_COMMUNITY): Payer: Self-pay | Admitting: Emergency Medicine

## 2015-05-25 ENCOUNTER — Emergency Department (HOSPITAL_COMMUNITY): Payer: 59

## 2015-05-25 DIAGNOSIS — E669 Obesity, unspecified: Secondary | ICD-10-CM | POA: Diagnosis not present

## 2015-05-25 DIAGNOSIS — R4701 Aphasia: Secondary | ICD-10-CM

## 2015-05-25 DIAGNOSIS — R262 Difficulty in walking, not elsewhere classified: Secondary | ICD-10-CM | POA: Diagnosis not present

## 2015-05-25 DIAGNOSIS — Z6839 Body mass index (BMI) 39.0-39.9, adult: Secondary | ICD-10-CM | POA: Diagnosis not present

## 2015-05-25 DIAGNOSIS — R531 Weakness: Secondary | ICD-10-CM | POA: Diagnosis not present

## 2015-05-25 DIAGNOSIS — R200 Anesthesia of skin: Secondary | ICD-10-CM | POA: Diagnosis not present

## 2015-05-25 DIAGNOSIS — E876 Hypokalemia: Secondary | ICD-10-CM | POA: Diagnosis not present

## 2015-05-25 DIAGNOSIS — R202 Paresthesia of skin: Secondary | ICD-10-CM | POA: Insufficient documentation

## 2015-05-25 DIAGNOSIS — R2990 Unspecified symptoms and signs involving the nervous system: Secondary | ICD-10-CM | POA: Diagnosis present

## 2015-05-25 DIAGNOSIS — I10 Essential (primary) hypertension: Secondary | ICD-10-CM | POA: Insufficient documentation

## 2015-05-25 DIAGNOSIS — G43109 Migraine with aura, not intractable, without status migrainosus: Principal | ICD-10-CM | POA: Insufficient documentation

## 2015-05-25 DIAGNOSIS — R4781 Slurred speech: Secondary | ICD-10-CM | POA: Insufficient documentation

## 2015-05-25 DIAGNOSIS — Z90710 Acquired absence of both cervix and uterus: Secondary | ICD-10-CM | POA: Diagnosis not present

## 2015-05-25 DIAGNOSIS — R51 Headache: Secondary | ICD-10-CM

## 2015-05-25 DIAGNOSIS — R519 Headache, unspecified: Secondary | ICD-10-CM | POA: Insufficient documentation

## 2015-05-25 HISTORY — DX: Gastro-esophageal reflux disease without esophagitis: K21.9

## 2015-05-25 LAB — APTT: APTT: 28 s (ref 24–37)

## 2015-05-25 LAB — DIFFERENTIAL
BASOS ABS: 0 10*3/uL (ref 0.0–0.1)
BASOS PCT: 0 %
Eosinophils Absolute: 0.2 10*3/uL (ref 0.0–0.7)
Eosinophils Relative: 2 %
LYMPHS ABS: 3.5 10*3/uL (ref 0.7–4.0)
LYMPHS PCT: 42 %
MONOS PCT: 6 %
Monocytes Absolute: 0.5 10*3/uL (ref 0.1–1.0)
NEUTROS ABS: 4 10*3/uL (ref 1.7–7.7)
Neutrophils Relative %: 48 %

## 2015-05-25 LAB — I-STAT TROPONIN, ED: TROPONIN I, POC: 0 ng/mL (ref 0.00–0.08)

## 2015-05-25 LAB — I-STAT CHEM 8, ED
BUN: 11 mg/dL (ref 6–20)
Calcium, Ion: 1.07 mmol/L — ABNORMAL LOW (ref 1.12–1.23)
Chloride: 100 mmol/L — ABNORMAL LOW (ref 101–111)
Creatinine, Ser: 0.9 mg/dL (ref 0.44–1.00)
Glucose, Bld: 101 mg/dL — ABNORMAL HIGH (ref 65–99)
HEMATOCRIT: 44 % (ref 36.0–46.0)
HEMOGLOBIN: 15 g/dL (ref 12.0–15.0)
Potassium: 3.3 mmol/L — ABNORMAL LOW (ref 3.5–5.1)
SODIUM: 142 mmol/L (ref 135–145)
TCO2: 27 mmol/L (ref 0–100)

## 2015-05-25 LAB — CBC
HEMATOCRIT: 39.7 % (ref 36.0–46.0)
HEMOGLOBIN: 12.6 g/dL (ref 12.0–15.0)
MCH: 25.5 pg — ABNORMAL LOW (ref 26.0–34.0)
MCHC: 31.7 g/dL (ref 30.0–36.0)
MCV: 80.2 fL (ref 78.0–100.0)
Platelets: 367 10*3/uL (ref 150–400)
RBC: 4.95 MIL/uL (ref 3.87–5.11)
RDW: 13.8 % (ref 11.5–15.5)
WBC: 8.3 10*3/uL (ref 4.0–10.5)

## 2015-05-25 LAB — COMPREHENSIVE METABOLIC PANEL
ALT: 22 U/L (ref 14–54)
AST: 23 U/L (ref 15–41)
Albumin: 4 g/dL (ref 3.5–5.0)
Alkaline Phosphatase: 48 U/L (ref 38–126)
Anion gap: 12 (ref 5–15)
BUN: 10 mg/dL (ref 6–20)
CHLORIDE: 103 mmol/L (ref 101–111)
CO2: 26 mmol/L (ref 22–32)
Calcium: 8.7 mg/dL — ABNORMAL LOW (ref 8.9–10.3)
Creatinine, Ser: 0.97 mg/dL (ref 0.44–1.00)
GFR calc Af Amer: >60 mL/min (ref >60)
Glucose, Bld: 107 mg/dL — ABNORMAL HIGH (ref 65–99)
POTASSIUM: 3.1 mmol/L — AB (ref 3.5–5.1)
Sodium: 141 mmol/L (ref 135–145)
Total Bilirubin: 0.4 mg/dL (ref 0.3–1.2)
Total Protein: 7.6 g/dL (ref 6.5–8.1)

## 2015-05-25 LAB — MAGNESIUM: Magnesium: 1 mg/dL — ABNORMAL LOW (ref 1.7–2.4)

## 2015-05-25 LAB — PROTIME-INR
INR: 1.03 (ref 0.00–1.49)
PROTHROMBIN TIME: 13.7 s (ref 11.6–15.2)

## 2015-05-25 LAB — CBG MONITORING, ED: GLUCOSE-CAPILLARY: 97 mg/dL (ref 65–99)

## 2015-05-25 MED ORDER — SUMATRIPTAN SUCCINATE 6 MG/0.5ML ~~LOC~~ SOLN
6.0000 mg | Freq: Once | SUBCUTANEOUS | Status: AC
  Administered 2015-05-25: 6 mg via SUBCUTANEOUS
  Filled 2015-05-25: qty 0.5

## 2015-05-25 MED ORDER — ENOXAPARIN SODIUM 40 MG/0.4ML ~~LOC~~ SOLN
40.0000 mg | SUBCUTANEOUS | Status: DC
  Administered 2015-05-25 – 2015-05-27 (×3): 40 mg via SUBCUTANEOUS
  Filled 2015-05-25 (×3): qty 0.4

## 2015-05-25 MED ORDER — ASPIRIN EC 81 MG PO TBEC
81.0000 mg | DELAYED_RELEASE_TABLET | Freq: Every day | ORAL | Status: DC
  Administered 2015-05-26 – 2015-05-27 (×2): 81 mg via ORAL
  Filled 2015-05-25 (×2): qty 1

## 2015-05-25 MED ORDER — POTASSIUM CHLORIDE CRYS ER 20 MEQ PO TBCR: 40.0000 meq | EXTENDED_RELEASE_TABLET | ORAL | Status: AC

## 2015-05-25 MED ORDER — SENNOSIDES-DOCUSATE SODIUM 8.6-50 MG PO TABS: 1.0000 | ORAL_TABLET | Freq: Every evening | ORAL | Status: DC | PRN

## 2015-05-25 MED ORDER — ASPIRIN EC 81 MG PO TBEC: 81.0000 mg | DELAYED_RELEASE_TABLET | Freq: Every day | ORAL | Status: DC

## 2015-05-25 MED ORDER — STROKE: EARLY STAGES OF RECOVERY BOOK
Freq: Once | Status: AC
  Administered 2015-05-25: 18:00:00
  Filled 2015-05-25: qty 1

## 2015-05-25 MED ORDER — MORPHINE SULFATE (PF) 2 MG/ML IV SOLN
2.0000 mg | INTRAVENOUS | Status: DC | PRN
  Administered 2015-05-26 – 2015-05-27 (×2): 2 mg via INTRAVENOUS
  Filled 2015-05-25 (×2): qty 1

## 2015-05-25 MED ORDER — POTASSIUM CHLORIDE CRYS ER 20 MEQ PO TBCR
40.0000 meq | EXTENDED_RELEASE_TABLET | Freq: Once | ORAL | Status: AC
  Administered 2015-05-25: 40 meq via ORAL
  Filled 2015-05-25: qty 2

## 2015-05-25 MED ORDER — ASPIRIN 325 MG PO TABS
975.0000 mg | ORAL_TABLET | Freq: Once | ORAL | Status: AC
  Administered 2015-05-25: 975 mg via ORAL
  Filled 2015-05-25: qty 3

## 2015-05-25 MED ORDER — ACETAMINOPHEN 325 MG PO TABS
650.0000 mg | ORAL_TABLET | ORAL | Status: DC | PRN
  Administered 2015-05-26 (×3): 650 mg via ORAL
  Filled 2015-05-25 (×3): qty 2

## 2015-05-25 MED ORDER — METOCLOPRAMIDE HCL 5 MG/ML IJ SOLN
10.0000 mg | Freq: Once | INTRAMUSCULAR | Status: AC
  Administered 2015-05-25: 10 mg via INTRAVENOUS
  Filled 2015-05-25: qty 2

## 2015-05-25 MED ORDER — MORPHINE SULFATE (PF) 4 MG/ML IV SOLN
4.0000 mg | Freq: Once | INTRAVENOUS | Status: AC
  Administered 2015-05-25: 4 mg via INTRAVENOUS
  Filled 2015-05-25: qty 1

## 2015-05-25 NOTE — ED Notes (Signed)
Pt transported to MRI 

## 2015-05-25 NOTE — ED Notes (Signed)
Traveling from out of town.  Sudden onset of slurred speech, expressive aphasia, and left sided headache one hour pta.

## 2015-05-25 NOTE — ED Notes (Signed)
Code Stroke activated from [facility].  C/o headache and "difficulty getting words out" that started while driving at 1:61WR3:30am.  Pt straight back with Reita ClicheBobby, Charity fundraiserN.  Marylene LandAngela, NS notified to call [facility].

## 2015-05-25 NOTE — Consult Note (Signed)
Admission H&P    Chief Complaint: The onset of headache with associated speech difficulty and tingling and numbness of right extremities.  HPI: Dawn [name] is an 35 y.o. female history of migraine headaches presenting with sudden onset of headache with associated right extremity weakness and numbness. Speech was initially slurred but has resolved. She still has mild sensory changes involving right side. She indicated she has not had local deficits of this nature associated with previous headaches. CT scan of her head showed no acute intracranial abnormality. She has not been on antiplatelet therapy. Right-sided weakness resolved, as well as speech output difficulty. NIH stroke score was 1 for residual facial sensory changes on the right.  LSN: 3:30 AM on 05/25/2015 tPA Given: No: Rapidly resolving deficits mRankin:  Past Medical History  Diagnosis Date  . Vaginal delivery 2004  . PONV (postoperative nausea and vomiting)     Past Surgical History  Procedure Laterality Date  . Laparoscopy N/A 02/18/2013    Procedure: LAPAROSCOPY OPERATIVE;  Surgeon: Levi Aland, MD;  Location: [facility];  Service: Gynecology;  Laterality: N/A;  YAG LASER  . Yag laser application N/A 02/18/2013    Procedure: YAG LASER APPLICATION;  Surgeon: Levi Aland, MD;  Location: [facility];  Service: Gynecology;  Laterality: N/A;  . Reduction mammaplasty  08/28/2014  . Tonsillectomy  2008  . Vaginal hysterectomy  06-26-14  . Breast reduction surgery Bilateral 08/28/2014    Procedure: BILATERAL BREAST  REDUCTION  ;  Surgeon: Etter Sjogren, MD;  Location: [facility] OR;  Service: Plastics;  Laterality: Bilateral;    Family History  Problem Relation Age of Onset  . Hyperlipidemia Mother   . Hypertension Mother    Social History:  reports that she has never smoked. She has never used smokeless tobacco. She reports that she does not drink alcohol or use illicit drugs.  Allergies:  Allergies  Allergen Reactions  . Claritin  [Loratadine] Other (See Comments)    Tremors    Medications: Patient's preadmission medications were reviewed by me.  ROS: History obtained from the patient  General ROS: negative for - chills, fatigue, fever, night sweats, weight gain or weight loss Psychological ROS: negative for - behavioral disorder, hallucinations, memory difficulties, mood swings or suicidal ideation Ophthalmic ROS: negative for - blurry vision, double vision, eye pain or loss of vision ENT ROS: negative for - epistaxis, nasal discharge, oral lesions, sore throat, tinnitus or vertigo Allergy and Immunology ROS: negative for - hives or itchy/watery eyes Hematological and Lymphatic ROS: negative for - bleeding problems, bruising or swollen lymph nodes Endocrine ROS: negative for - galactorrhea, hair pattern changes, polydipsia/polyuria or temperature intolerance Respiratory ROS: negative for - cough, hemoptysis, shortness of breath or wheezing Cardiovascular ROS: negative for - chest pain, dyspnea on exertion, edema or irregular heartbeat Gastrointestinal ROS: negative for - abdominal pain, diarrhea, hematemesis, nausea/vomiting or stool incontinence Genito-Urinary ROS: negative for - dysuria, hematuria, incontinence or urinary frequency/urgency Musculoskeletal ROS: negative for - joint swelling or muscular weakness Neurological ROS: as noted in HPI Dermatological ROS: negative for rash and skin lesion changes  Physical Examination: Blood pressure 138/91, pulse 95, height  (1.676 m), weight 111.131 kg (245 lb), last menstrual period 06/25/2014, SpO2 100 %.  HEENT-  Normocephalic, no lesions, without obvious abnormality.  Normal external eye and conjunctiva.  Normal TM's bilaterally.  Normal auditory canals and external ears. Normal external nose, mucus membranes and septum.  Normal pharynx. Neck supple with no masses, nodes, nodules or  enlargement. Cardiovascular - regular rate and rhythm, S1, S2 normal, no  murmur, click, rub or gallop Lungs - chest clear, no wheezing, rales, normal symmetric air entry Abdomen - soft, non-tender; bowel sounds normal; no masses,  no organomegaly Extremities - no joint deformities, effusion, or inflammation and no edema  Neurologic Examination: Mental Status: Alert, oriented, thought content appropriate.  Speech fluent without evidence of aphasia. Able to follow commands without difficulty. Cranial Nerves: II-Visual fields were normal. III/IV/VI-Pupils were equal and reacted. Extraocular movements were full and conjugate.    V/VII-reduced perception of tactile sensation over right side of the face compared to left; no facial weakness. VIII-normal. X-normal speech and symmetrical palatal movement. XI: trapezius strength/neck flexion strength normal bilaterally XII-midline tongue extension with normal strength. Motor: 5/5 bilaterally with normal tone and bulk Sensory: Normal throughout. Deep Tendon Reflexes: 2+ and symmetric. Plantars: Flexor bilaterally Cerebellar: Normal finger-to-nose testing. Carotid auscultation: Normal  Results for orders placed or performed during the hospital encounter of 05/25/15 (from the past 48 hour(s))  I-stat troponin, ED (not at [facility], [facility])     Status: None   Collection Time: 05/25/15  4:48 AM  Result Value Ref Range   Troponin i, poc 0.00 0.00 - 0.08 ng/mL   Comment 3            Comment: Due to the release kinetics of cTnI, a negative result within the first hours of the onset of symptoms does not rule out myocardial infarction with certainty. If myocardial infarction is still suspected, repeat the test at appropriate intervals.   I-Stat Chem 8, ED  (not at [facility], [facility])     Status: Abnormal   Collection Time: 05/25/15  4:49 AM  Result Value Ref Range   Sodium 142 135 - 145 mmol/L   Potassium 3.3 (L) 3.5 - 5.1 mmol/L   Chloride 100 (L) 101 - 111 mmol/L   BUN 11 6 - 20 mg/dL   Creatinine, Ser 1.61 0.44 - 1.00 mg/dL    Glucose, Bld 096 (H) 65 - 99 mg/dL   Calcium, Ion 0.45 (L) 1.12 - 1.23 mmol/L   TCO2 27 0 - 100 mmol/L   Hemoglobin 15.0 12.0 - 15.0 g/dL   HCT 40.9 81.1 - 91.4 %  Protime-INR     Status: None   Collection Time: 05/25/15  4:50 AM  Result Value Ref Range   Prothrombin Time 13.7 11.6 - 15.2 seconds   INR 1.03 0.00 - 1.49  APTT     Status: None   Collection Time: 05/25/15  4:50 AM  Result Value Ref Range   aPTT 28 24 - 37 seconds  CBC     Status: Abnormal   Collection Time: 05/25/15  4:50 AM  Result Value Ref Range   WBC 8.3 4.0 - 10.5 K/uL   RBC 4.95 3.87 - 5.11 MIL/uL   Hemoglobin 12.6 12.0 - 15.0 g/dL   HCT 78.2 95.6 - 21.3 %   MCV 80.2 78.0 - 100.0 fL   MCH 25.5 (L) 26.0 - 34.0 pg   MCHC 31.7 30.0 - 36.0 g/dL   RDW 08.6 57.8 - 46.9 %   Platelets 367 150 - 400 K/uL  Differential     Status: None   Collection Time: 05/25/15  4:50 AM  Result Value Ref Range   Neutrophils Relative % 48 %   Neutro Abs 4.0 1.7 - 7.7 K/uL   Lymphocytes Relative 42 %   Lymphs Abs 3.5 0.7 - 4.0 K/uL  Monocytes Relative 6 %   Monocytes Absolute 0.5 0.1 - 1.0 K/uL   Eosinophils Relative 2 %   Eosinophils Absolute 0.2 0.0 - 0.7 K/uL   Basophils Relative 0 %   Basophils Absolute 0.0 0.0 - 0.1 K/uL   Ct Head Wo Contrast  05/25/2015  CLINICAL DATA:  Code stroke. Headache and difficulty getting words out beginning at 3:30 a.m. EXAM: CT HEAD WITHOUT CONTRAST TECHNIQUE: Contiguous axial images were obtained from the base of the skull through the vertex without intravenous contrast. COMPARISON:  None. FINDINGS: Ventricles and sulci appear symmetrical. No ventricular dilatation. No mass effect or midline shift. No abnormal extra-axial fluid collections. Gray-white matter junctions are distinct. Basal cisterns are not effaced. No evidence of acute intracranial hemorrhage. No depressed skull fractures. Large retention cysts in the left maxillary antrum. Mastoid air cells are not opacified. IMPRESSION: No acute  intracranial abnormalities. These results were called by telephone at the time of interpretation on 05/25/2015 at 5:10 am to Dr. Roseanne RenoStewart, who verbally acknowledged these results. Electronically Signed   By: Burman NievesWilliam  Stevens M.D.   On: 05/25/2015 05:12    Assessment: 35 y.o. female with a history of migraine headaches presenting with headache with sudden onset of deficits as described above which have resolved for the most part. Complicated migraine cannot be ruled out. As well TIA or possible small right MCA territory cerebral infarction cannot be ruled out.  Stroke Risk Factors - family history  Plan: 1. HgbA1c, fasting lipid panel 2. MRI, MRA  of the brain without contrast 3. PT consult, OT consult, Speech consult 4. Echocardiogram 5. Carotid dopplers 6. Prophylactic therapy-Antiplatelet med: Aspirin  7. Risk factor modification 8. Telemetry monitoring  C.R. Roseanne RenoStewart, MD [facility] [phone]  05/25/2015, 5:18 AM

## 2015-05-25 NOTE — ED Provider Notes (Signed)
CSN: [id number]     Arrival date & time 05/25/15  0439 History   First MD Initiated Contact with Patient 05/25/15 0449     Chief Complaint  Patient presents with  . Code Stroke    Level V caveat: Expressive aphasia   HPI Patient presents the emergency department complains of severe acute onset left-sided headache and difficulty getting her words out which began approximately 3:30.  Patient presents to the emergency department for evaluation and a code stroke was called by nursing staff on arrival to emergency department.  She still having some difficulty with her speech is still reports left-sided headache at this time.  She reports weakness of her left side as well.  No prior history of stroke.  She does have a history of migraines per the medical chart.  Denies chest pain shortness breath.  No abdominal pain.  She states she was in her normal state health earlier.  Nursing staff reports that her speech is improving since their evaluation.   Past Medical History  Diagnosis Date  . Vaginal delivery 2004  . PONV (postoperative nausea and vomiting)    Past Surgical History  Procedure Laterality Date  . Laparoscopy N/A 02/18/2013    Procedure: LAPAROSCOPY OPERATIVE;  Surgeon: Levi AlandMark E Anderson, MD;  Location: [facility];  Service: Gynecology;  Laterality: N/A;  YAG LASER  . Yag laser application N/A 02/18/2013    Procedure: YAG LASER APPLICATION;  Surgeon: Levi AlandMark E Anderson, MD;  Location: [facility];  Service: Gynecology;  Laterality: N/A;  . Reduction mammaplasty  08/28/2014  . Tonsillectomy  2008  . Vaginal hysterectomy  06-26-14  . Breast reduction surgery Bilateral 08/28/2014    Procedure: BILATERAL BREAST  REDUCTION  ;  Surgeon: Etter Sjogrenavid Bowers, MD;  [facility];  Service: Plastics;  Laterality: Bilateral;   Family History  Problem Relation Age of Onset  . Hyperlipidemia Mother   . Hypertension Mother    Social History  Substance Use Topics  . Smoking status: Never Smoker   . Smokeless  tobacco: Never Used  . Alcohol Use: No   OB History    Gravida Para Term Preterm AB TAB SAB Ectopic Multiple Living   3 1 1  2  2   1      Review of Systems  Unable to perform ROS: Other      Allergies  Claritin  Home Medications   Prior to Admission medications   Medication Sig Start Date End Date Taking? Authorizing Provider  fluconazole (DIFLUCAN) 100 MG tablet Take 1 tablet (100 mg total) by mouth once. Repeat dose in 48-72 hour. 01/05/15   Rachelle A Denney, CNM  terconazole (TERAZOL 7) 0.4 % vaginal cream Place 1 applicator vaginally at bedtime. 01/05/15   Roe Coombsachelle A Denney, CNM   LMP 06/25/2014 (Approximate) Physical Exam  Constitutional: She is oriented to person, place, and time. She appears well-developed and well-nourished. No distress.  HENT:  Head: Normocephalic and atraumatic.  Eyes: EOM are normal. Pupils are equal, round, and reactive to light.  Neck: Normal range of motion.  Cardiovascular: Normal rate, regular rhythm and normal heart sounds.   Pulmonary/Chest: Effort normal and breath sounds normal.  Abdominal: Soft. She exhibits no distension. There is no tenderness.  Musculoskeletal: Normal range of motion.  Neurological: She is alert and oriented to person, place, and time.  5/5 strength in major muscle groups of  bilateral upper and lower extremities.  Mild expressive aphasia  Skin: Skin is warm and dry.  Psychiatric: She has a normal mood and affect. Judgment normal.  Nursing note and vitals reviewed.   ED Course  Procedures (including critical care time) Labs Review Labs Reviewed  CBC - Abnormal; Notable for the following:    MCH 25.5 (*)    All other components within normal limits  COMPREHENSIVE METABOLIC PANEL - Abnormal; Notable for the following:    Potassium 3.1 (*)    Glucose, Bld 107 (*)    Calcium 8.7 (*)    All other components within normal limits  I-STAT CHEM 8, ED - Abnormal; Notable for the following:    Potassium 3.3 (*)     Chloride 100 (*)    Glucose, Bld 101 (*)    Calcium, Ion 1.07 (*)    All other components within normal limits  PROTIME-INR  APTT  DIFFERENTIAL  I-STAT TROPOININ, ED  CBG MONITORING, ED    Imaging Review Ct Head Wo Contrast  05/25/2015  CLINICAL DATA:  Code stroke. Headache and difficulty getting words out beginning at 3:30 a.m. EXAM: CT HEAD WITHOUT CONTRAST TECHNIQUE: Contiguous axial images were obtained from the base of the skull through the vertex without intravenous contrast. COMPARISON:  None. FINDINGS: Ventricles and sulci appear symmetrical. No ventricular dilatation. No mass effect or midline shift. No abnormal extra-axial fluid collections. Gray-white matter junctions are distinct. Basal cisterns are not effaced. No evidence of acute intracranial hemorrhage. No depressed skull fractures. Large retention cysts in the left maxillary antrum. Mastoid air cells are not opacified. IMPRESSION: No acute intracranial abnormalities. These results were called by telephone at the time of interpretation on 05/25/2015 at 5:10 am to Dr. Roseanne Reno, who verbally acknowledged these results. Electronically Signed   By: Burman Nieves M.D.   On: 05/25/2015 05:12   I have personally reviewed and evaluated these images and lab results as part of my medical decision-making.  ECG interpretation   Date: 05/25/2015  Rate: 91  Rhythm: normal sinus rhythm  QRS Axis: normal  Intervals: normal  ST/T Wave abnormalities: normal  Conduction Disutrbances: none  Narrative Interpretation:   Old EKG Reviewed: No significant changes noted     MDM   Final diagnoses:  Headache, unspecified headache type  Neurological complaint    Patient was seen by neurology who believes this may represent TIA as she's had significant improvement in her speech to this point.  She will be admitted the hospital for ongoing workup of possible TIA.  This also and more likely represents, get a migraine.  Head CT without acute  abnormality.  We'll continue to monitor the patient closely in the emergency department and will work on admission to the hospital at this time.    Azalia Bilis, MD 05/25/15 [phone]

## 2015-05-25 NOTE — Progress Notes (Signed)
STROKE TEAM PROGRESS NOTE   HISTORY OF PRESENT ILLNESS Dawn [name] is an 35 y.o. female history of migraine headaches presenting with sudden onset of headache with associated right extremity weakness and numbness. Speech was initially slurred but has resolved. She still has mild sensory changes involving right side. She indicated she has not had local deficits of this nature associated with previous headaches. CT scan of her head showed no acute intracranial abnormality. She has not been on antiplatelet therapy. Right-sided weakness resolved, as well as speech output difficulty. NIH stroke score was 1 for residual facial sensory changes on the right. She was last known well 3:30 AM on 05/25/2015. Patient was not administered IV t-PA secondary to rapidly resolving deficits. She was admitted for further evaluation and treatment.   SUBJECTIVE (INTERVAL HISTORY) No family is at the bedside.  Overall she feels her condition is stable.    OBJECTIVE Temp:  [97.5 F (36.4 C)-98.1 F (36.7 C)] 98.1 F (36.7 C) (04/17 1015) Pulse Rate:  [75-97] 86 (04/17 1015) Cardiac Rhythm:  [-]  Resp:  [7-20] 20 (04/17 1015) BP: (117-152)/(65-135) 117/65 mmHg (04/17 1015) SpO2:  [97 %-100 %] 97 % (04/17 1015) Weight:  [111.131 kg (245 lb)] 111.131 kg (245 lb) (04/17 0514)  CBC:   Recent Labs Lab 05/25/15 0449 05/25/15 0450  WBC  --  8.3  NEUTROABS  --  4.0  HGB 15.0 12.6  HCT 44.0 39.7  MCV  --  80.2  PLT  --  367    Basic Metabolic Panel:   Recent Labs Lab 05/25/15 0449 05/25/15 0450  NA 142 141  K 3.3* 3.1*  CL 100* 103  CO2  --  26  GLUCOSE 101* 107*  BUN 11 10  CREATININE 0.90 0.97  CALCIUM  --  8.7*    Lipid Panel: No results found for: CHOL, TRIG, HDL, CHOLHDL, VLDL, LDLCALC HgbA1c: No results found for: HGBA1C Urine Drug Screen: No results found for: LABOPIA, COCAINSCRNUR, LABBENZ, AMPHETMU, THCU, LABBARB    IMAGING  Dg Chest 2 View  05/25/2015  CLINICAL DATA:  Acute onset  headache with right upper extremity weakness and numbness today. Transient slurred speech. Initial encounter. EXAM: CHEST  2 VIEW COMPARISON:  None. FINDINGS: The lungs are clear. Heart size is normal. No pneumothorax or pleural effusion. No focal bony abnormality. IMPRESSION: Negative chest. Electronically Signed   By: Drusilla Kanner M.D.   On: 05/25/2015 08:55   Ct Head Wo Contrast  05/25/2015  CLINICAL DATA:  Code stroke. Headache and difficulty getting words out beginning at 3:30 a.m. EXAM: CT HEAD WITHOUT CONTRAST TECHNIQUE: Contiguous axial images were obtained from the base of the skull through the vertex without intravenous contrast. COMPARISON:  None. FINDINGS: Ventricles and sulci appear symmetrical. No ventricular dilatation. No mass effect or midline shift. No abnormal extra-axial fluid collections. Gray-white matter junctions are distinct. Basal cisterns are not effaced. No evidence of acute intracranial hemorrhage. No depressed skull fractures. Large retention cysts in the left maxillary antrum. Mastoid air cells are not opacified. IMPRESSION: No acute intracranial abnormalities. These results were called by telephone at the time of interpretation on 05/25/2015 at 5:10 am to Dr. Roseanne Reno, who verbally acknowledged these results. Electronically Signed   By: Burman Nieves M.D.   On: 05/25/2015 05:12   Mr Brain Wo Contrast  05/25/2015  CLINICAL DATA:  35 year old female with migraine headaches presenting with headache and associated right upper extremity weakness and numbness. Slurred speech. Subsequent encounter. EXAM: MRI HEAD  WITHOUT CONTRAST TECHNIQUE: Multiplanar, multiecho pulse sequences of the brain and surrounding structures were obtained without intravenous contrast. COMPARISON:  05/25/2015 CT.  No comparison MR FINDINGS: No acute infarct or intracranial hemorrhage. No intracranial mass lesion noted on this unenhanced exam. No hydrocephalus. Minimal punctate nonspecific white matter  changes frontal lobes. Similar appearance described in patients with migraine headaches. Other causes of white matter changes felt to be less likely considerations. Either tortuous right internal carotid artery cavernous segment versus small aneurysm. MR angiogram circle Willis or CT angiogram can be obtained for further delineation. Major intracranial vascular structures are patent. Partial opacification left mastoid air cells. Mild prominence adenoidal tissue but without mass causing eustachian tube dysfunction. 2.8 cm polypoid opacification left maxillary sinus may represent retention cyst. Minimal partial opacification inferior right maxillary sinus. Orbital structures and cervical medullary junction unremarkable. No pituitary mass identified.  Slight asymmetry of the sella. IMPRESSION: No acute infarct or intracranial hemorrhage. No intracranial mass lesion noted on this unenhanced exam. Minimal punctate nonspecific white matter changes frontal lobes. Similar appearance described in patients with migraine headaches. Either tortuous right internal carotid artery cavernous segment versus small aneurysm. MR angiogram circle of Willis or CT angiogram can be obtained for further delineation. Partial opacification left mastoid air cells. Mild prominence adenoidal tissue but without mass causing eustachian tube dysfunction. 2.8 cm polypoid opacification left maxillary sinus may represent retention cyst. Minimal partial opacification inferior right maxillary sinus. Electronically Signed   By: Lacy DuverneySteven  Olson M.D.   On: 05/25/2015 08:32   Mr Maxine GlennMra Head/brain Wo Cm  05/25/2015  CLINICAL DATA:  35 year old female with headaches associated with right upper extremity weakness and numbness. Slurred speech. Abnormal MRI appearance of the right ICA cavernous segment earlier today. Initial encounter. EXAM: MRA HEAD WITHOUT CONTRAST TECHNIQUE: Angiographic images of the Circle of Willis were obtained using MRA technique without  intravenous contrast. COMPARISON:  Brain MRI 0727 hours today. FINDINGS: Antegrade flow in the posterior circulation with dominant distal left vertebral artery. Normal PICA origins and vertebrobasilar junction. Normal AICA origins. No basilar stenosis. Normal SCA and left PCA origins. Fetal type right PCA origin. Both posterior communicating arteries are present. Bilateral PCA branches are within normal limits. Antegrade flow in both ICA siphons. The contour deformity of the cavernous right ICA seen earlier corresponds to tortuosity, but there is minimal to mild fusiform enlargement of the vessel there, measuring 4-5 mm diameter. Furthermore, in the distal cavernous segment there is a small triangular posteriorly and inferiorly directed area of the vessel best seen on series 3, image 91. The anterior genu, right ophthalmic artery origin, right posterior communicating artery origin, and right ICA terminus then appear normal. The right A1 segment is non dominant. Negative left ICA siphon. Negative left ophthalmic and posterior communicating artery origins. Normal left ICA terminus. Normal MCA origins. Dominant left ACA A1 segment. Anterior communicating artery and visualized ACA branches are within normal limits. Normal MCA M1 segments and bifurcations. Early right MCA branching. Visualized bilateral MCA branches are within normal limits. IMPRESSION: 1. The MRI appearance of the right ICA siphon earlier today corresponds to tortuosity of the cavernous segment. But in addition there is mild fusiform enlargement of that segment, as well as a probable small cavernous branch infundibulum (series 3, image 91). As such, recommend a repeat noncontrast MRA Head in 1 year to evaluate stability. 2. Otherwise negative intracranial MRA. Electronically Signed   By: Odessa FlemingH  Hall M.D.   On: 05/25/2015 09:22  PHYSICAL EXAM   ASSESSMENT/PLAN Ms. Anika Shore is a 35 y.o. female with history of migraine headaches presenting  with headache with associated speech difficulty and right extremity tingling and numbness. She did not receive IV t-PA due to rapidly resolving deficits.   Atypical migraine variant  Resultant  headache  MRI  No acute stroke  MRA  R ICA with mild fusiform enlargement  Carotid Doppler  pending   2D Echo  pending   LDL pending   HgbA1c pending  Lovenox 40 mg sq daily for VTE prophylaxis Diet Heart Room service appropriate?: Yes; Fluid consistency:: Thin  No antithrombotic prior to admission, have added aspirin 81 mg daily  imitrex 6 mg sq x 1 for HA management  Therapy recommendations:  pending   Disposition:  pending   Other Stroke Risk Factors  Obesity, Body mass index is 39.56 kg/(m^2).   Other Active Problems Hypokalemia Elevated BP, improving  Hospital day #   Rhoderick Moody [facility] See Amion for Pager information 05/25/2015 11:18 AM  I have personally examined this patient, reviewed notes, independently viewed imaging studies, participated in medical decision making and plan of care. I have made any additions or clarifications directly to the above note. Agree with note above. She presented with severe headache and transient right-sided eakness and speech difficulties which have resolved. This likely represents complicated migraine episode as the brain MRI scan is negative for acute infarct and she had similar headache episodes in the past when she was on hormone replacement therapy. Recommend Imitrex to treat her headache and start coated aspirin 81 mg daily. Check echocardiogram and Doppler studies. She remains at risk for recurrent headache, TIA, strokes, neurological worsening and needs about evaluation. Follow-up as an outpatient.  Delia Heady, MD Medical Director [facility] Pager: [phone] 05/25/2015 3:31 PM    To contact Stroke Continuity provider, please refer to [email or web address]. After hours, contact General Neurology
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
## 2015-05-25 NOTE — ED Notes (Signed)
[facility] called @ 0444.

## 2015-05-25 NOTE — Care Management Note (Addendum)
Case Management Note  Patient Details  Name: Lamar SprinklesBetty Mentor MRN: [id number] Date of Birth: [birth year]  Subjective/Objective:    Patient admitted to r/o CVA. MRI results negative.   Pt is from home.            Action/Plan: Awaiting PT/OT recs. CM following for discharge disposition.   Expected Discharge Date:                  Expected Discharge Plan:     In-House Referral:     Discharge planning Services     Post Acute Care Choice:    Choice offered to:     DME Arranged:    DME Agency:     HH Arranged:    HH Agency:     Status of Service:  In process, will continue to follow  Medicare Important Message Given:    Date Medicare IM Given:    Medicare IM give by:    Date Additional Medicare IM Given:    Additional Medicare Important Message give by:     If discussed at Long Length of Stay Meetings, dates discussed:    Additional Comments:  Kermit BaloKelli F Hayato Guaman, RN 05/25/2015, 11:20 AM

## 2015-05-25 NOTE — Code Documentation (Signed)
Code Stroke called at 0446 for this 35 y/o black female pt who was LSW at 0330.  Pt was driving her car and developed word finding difficulty, slurred speech, Left side sharp HA rated 7/10, blurred vision, and numbness and weakness to right arm and leg.  [facility] [id number].  She was cleared for CT by Dr Patria Maneampos at [phone]  with arrival in CT at [id number]. Upon return to ED  B-17 pt's slurred speech had resolved but she states continued trouble with word finding. Her right sided weakness has also resolved but she continues with right UE slight numbness.  NIHSS scored a 1 for minor sensory loss.  CT results of no acute findings were called to Dr Roseanne RenoStewart at [phone].  CBG was 98 and BP 138/91.  Pt to be admitted by [facility] hospitalist for TIA.

## 2015-05-26 ENCOUNTER — Observation Stay (HOSPITAL_BASED_OUTPATIENT_CLINIC_OR_DEPARTMENT_OTHER): Payer: 59

## 2015-05-26 DIAGNOSIS — R51 Headache: Secondary | ICD-10-CM | POA: Diagnosis not present

## 2015-05-26 DIAGNOSIS — R2990 Unspecified symptoms and signs involving the nervous system: Secondary | ICD-10-CM | POA: Diagnosis not present

## 2015-05-26 DIAGNOSIS — I10 Essential (primary) hypertension: Secondary | ICD-10-CM | POA: Diagnosis not present

## 2015-05-26 LAB — BASIC METABOLIC PANEL
Anion gap: 13 (ref 5–15)
BUN: 10 mg/dL (ref 6–20)
CHLORIDE: 103 mmol/L (ref 101–111)
CO2: 23 mmol/L (ref 22–32)
Calcium: 8.3 mg/dL — ABNORMAL LOW (ref 8.9–10.3)
Creatinine, Ser: 0.83 mg/dL (ref 0.44–1.00)
GFR calc non Af Amer: >60 mL/min (ref >60)
Glucose, Bld: 121 mg/dL — ABNORMAL HIGH (ref 65–99)
POTASSIUM: 3.2 mmol/L — AB (ref 3.5–5.1)
SODIUM: 139 mmol/L (ref 135–145)

## 2015-05-26 LAB — ECHOCARDIOGRAM COMPLETE
Height: 66 in
Weight: 3920 oz

## 2015-05-26 LAB — LIPID PANEL
CHOL/HDL RATIO: 5 ratio
Cholesterol: 171 mg/dL (ref 0–200)
HDL: 34 mg/dL — AB (ref >40)
LDL CALC: 111 mg/dL — AB (ref 0–99)
Triglycerides: 131 mg/dL (ref <150)
VLDL: 26 mg/dL (ref 0–40)

## 2015-05-26 LAB — CBC
HEMATOCRIT: 37.2 % (ref 36.0–46.0)
HEMOGLOBIN: 12.1 g/dL (ref 12.0–15.0)
MCH: 25.9 pg — ABNORMAL LOW (ref 26.0–34.0)
MCHC: 32.5 g/dL (ref 30.0–36.0)
MCV: 79.7 fL (ref 78.0–100.0)
Platelets: 310 10*3/uL (ref 150–400)
RBC: 4.67 MIL/uL (ref 3.87–5.11)
RDW: 13.8 % (ref 11.5–15.5)
WBC: 6.6 10*3/uL (ref 4.0–10.5)

## 2015-05-26 MED ORDER — TRAMADOL HCL 50 MG PO TABS
100.0000 mg | ORAL_TABLET | Freq: Four times a day (QID) | ORAL | Status: DC | PRN
Start: 2015-05-26 — End: 2015-05-27
  Administered 2015-05-27: 100 mg via ORAL
  Filled 2015-05-26: qty 2

## 2015-05-26 MED ORDER — TOPIRAMATE 25 MG PO TABS
50.0000 mg | ORAL_TABLET | Freq: Two times a day (BID) | ORAL | Status: DC
  Administered 2015-05-26 – 2015-05-27 (×3): 50 mg via ORAL
  Filled 2015-05-26 (×3): qty 2

## 2015-05-26 MED ORDER — TRAMADOL HCL 50 MG PO TABS
100.0000 mg | ORAL_TABLET | ORAL | Status: AC
  Administered 2015-05-26: 100 mg via ORAL
  Filled 2015-05-26: qty 2

## 2015-05-26 MED ORDER — WHITE PETROLATUM GEL
Status: AC
  Administered 2015-05-26: 0.2
  Filled 2015-05-26: qty 1

## 2015-05-26 MED ORDER — POTASSIUM CHLORIDE CRYS ER 20 MEQ PO TBCR
40.0000 meq | EXTENDED_RELEASE_TABLET | Freq: Two times a day (BID) | ORAL | Status: AC
  Administered 2015-05-26 – 2015-05-27 (×2): 40 meq via ORAL
  Filled 2015-05-26 (×2): qty 2

## 2015-05-26 MED ORDER — ALPRAZOLAM 0.25 MG PO TABS
0.2500 mg | ORAL_TABLET | Freq: Once | ORAL | Status: AC
  Administered 2015-05-26: 0.25 mg via ORAL
  Filled 2015-05-26: qty 1

## 2015-05-26 NOTE — Progress Notes (Signed)
PROGRESS NOTE    Dawn [name]  ZOX:[id number] DOB: [birth year] DOA: 05/25/2015 PCP: Delbert Harness, MD     Brief Narrative: Dawn [name] is a 35 y.o. female with a Past Medical History of hysterectomy and breast reduction who presents with HA and stroke like symptoms. Her MRI brain is negative for stroke , neurology consulted and recommendations given.   Assessment & Plan:   Active Problems:   Stroke-like symptoms   Cephalalgia   Essential hypertension   Complex migraines: Started on topomax and tramadol.  Neurology consulted and appreciate recommendations.  Outpatient neurology follow up in the clinic.  Pain control.    Hypertension: well controlled.   Hypokalemia: replete as needed. Repeat in am.    DVT prophylaxis: (Lovenox Code Status: (Full Family Communication: mother at bedside.  Disposition Plan: possible home in am.    Consultants:   Neurology.    Procedures: MRI negative.    Antimicrobials: none   Subjective: Having some tremors int he right thigh this evening.   Objective: Filed Vitals:   05/26/15 0552 05/26/15 1021 05/26/15 1603 05/26/15 1840  BP: 101/62 136/76 117/65 119/70  Pulse: 80 75 88 76  Temp: 97.9 F (36.6 C) 98.8 F (37.1 C) 98.4 F (36.9 C) 98.2 F (36.8 C)  TempSrc: Oral Oral Oral Oral  Resp: Height:      Weight:      SpO2: 97% 99% 96% 100%   No intake or output data in the 24 hours ending 05/26/15 1846 Filed Weights   05/25/15 0514  Weight: 111.131 kg (245 lb)    Examination:  General exam: Appears calm and comfortable  Respiratory system: Clear to auscultation. Respiratory effort normal. Cardiovascular system: S1 & S2 heard, RRR. No JVD, murmurs, rubs, gallops or clicks. No pedal edema. Gastrointestinal system: Abdomen is nondistended, soft and nontender. No organomegaly or masses felt. Normal bowel sounds heard. Central nervous system: Alert and oriented. No focal neurological deficits. Extremities:  Symmetric 5 x 5 power. Skin: No rashes, lesions or ulcers Psychiatry: Judgement and insight appear normal. Mood & affect appropriate.     Data Reviewed: I have personally reviewed following labs and imaging studies  CBC:  Recent Labs Lab 05/25/15 0449 05/25/15 0450 05/26/15 0706  WBC  --  8.3 6.6  NEUTROABS  --  4.0  --   HGB 15.0 12.6 12.1  HCT 44.0 39.7 37.2  MCV  --  80.2 79.7  PLT  --  367 310   Basic Metabolic Panel:  Recent Labs Lab 05/25/15 0449 05/25/15 0450 05/26/15 0706  NA 142 141 139  K 3.3* 3.1* 3.2*  CL 100* 103 103  CO2  --  26 23  GLUCOSE 101* 107* 121*  BUN CREATININE 0.90 0.97 0.83  CALCIUM  --  8.7* 8.3*  MG  --  1.0*  --    GFR: Estimated Creatinine Clearance: 120.6 mL/min (by C-G formula based on Cr of 0.83). Liver Function Tests:  Recent Labs Lab 05/25/15 0450  AST 23  ALT 22  ALKPHOS 48  BILITOT 0.4  PROT 7.6  ALBUMIN 4.0   No results for input(s): LIPASE, AMYLASE in the last 168 hours. No results for input(s): AMMONIA in the last 168 hours. Coagulation Profile:  Recent Labs Lab 05/25/15 0450  INR 1.03   Cardiac Enzymes: No results for input(s): CKTOTAL, CKMB, CKMBINDEX, TROPONINI in the last 168 hours. BNP (last 3 results) No results for input(s): PROBNP in  the last 8760 hours. HbA1C: No results for input(s): HGBA1C in the last 72 hours. CBG:  Recent Labs Lab 05/25/15 0523  GLUCAP 97   Lipid Profile:  Recent Labs  05/26/15 0706  CHOL 171  HDL 34*  LDLCALC 111*  TRIG 131  CHOLHDL 5.0   Thyroid Function Tests: No results for input(s): TSH, T4TOTAL, FREET4, T3FREE, THYROIDAB in the last 72 hours. Anemia Panel: No results for input(s): VITAMINB12, FOLATE, FERRITIN, TIBC, IRON, RETICCTPCT in the last 72 hours. Urine analysis:    Component Value Date/Time   COLORURINE YELLOW 06/15/2012 1522   APPEARANCEUR TURBID* 06/15/2012 1522   LABSPEC 1.025 06/15/2012 1522   PHURINE 6.5 06/15/2012 1522    GLUCOSEU NEGATIVE 06/15/2012 1522   HGBUR LARGE* 06/15/2012 1522   BILIRUBINUR NEGATIVE 06/15/2012 1522   KETONESUR NEGATIVE 06/15/2012 1522   PROTEINUR 30* 06/15/2012 1522   UROBILINOGEN 0.2 06/15/2012 1522   NITRITE NEGATIVE 06/15/2012 1522   LEUKOCYTESUR SMALL* 06/15/2012 1522   Sepsis Labs: @LABRCNTIP (procalcitonin:4,lacticidven:4)  )No results found for this or any previous visit (from the past 240 hour(s)).       Radiology Studies: Dg Chest 2 View  05/25/2015  CLINICAL DATA:  Acute onset headache with right upper extremity weakness and numbness today. Transient slurred speech. Initial encounter. EXAM: CHEST  2 VIEW COMPARISON:  None. FINDINGS: The lungs are clear. Heart size is normal. No pneumothorax or pleural effusion. No focal bony abnormality. IMPRESSION: Negative chest. Electronically Signed   By: Drusilla Kannerhomas  Dalessio M.D.   On: 05/25/2015 08:55   Ct Head Wo Contrast  05/25/2015  CLINICAL DATA:  Code stroke. Headache and difficulty getting words out beginning at 3:30 a.m. EXAM: CT HEAD WITHOUT CONTRAST TECHNIQUE: Contiguous axial images were obtained from the base of the skull through the vertex without intravenous contrast. COMPARISON:  None. FINDINGS: Ventricles and sulci appear symmetrical. No ventricular dilatation. No mass effect or midline shift. No abnormal extra-axial fluid collections. Gray-white matter junctions are distinct. Basal cisterns are not effaced. No evidence of acute intracranial hemorrhage. No depressed skull fractures. Large retention cysts in the left maxillary antrum. Mastoid air cells are not opacified. IMPRESSION: No acute intracranial abnormalities. These results were called by telephone at the time of interpretation on 05/25/2015 at 5:10 am to Dr. Roseanne RenoStewart, who verbally acknowledged these results. Electronically Signed   By: Burman NievesWilliam  Stevens M.D.   On: 05/25/2015 05:12   Mr Brain Wo Contrast  05/25/2015  CLINICAL DATA:  35 year old female with migraine  headaches presenting with headache and associated right upper extremity weakness and numbness. Slurred speech. Subsequent encounter. EXAM: MRI HEAD WITHOUT CONTRAST TECHNIQUE: Multiplanar, multiecho pulse sequences of the brain and surrounding structures were obtained without intravenous contrast. COMPARISON:  05/25/2015 CT.  No comparison MR FINDINGS: No acute infarct or intracranial hemorrhage. No intracranial mass lesion noted on this unenhanced exam. No hydrocephalus. Minimal punctate nonspecific white matter changes frontal lobes. Similar appearance described in patients with migraine headaches. Other causes of white matter changes felt to be less likely considerations. Either tortuous right internal carotid artery cavernous segment versus small aneurysm. MR angiogram circle Willis or CT angiogram can be obtained for further delineation. Major intracranial vascular structures are patent. Partial opacification left mastoid air cells. Mild prominence adenoidal tissue but without mass causing eustachian tube dysfunction. 2.8 cm polypoid opacification left maxillary sinus may represent retention cyst. Minimal partial opacification inferior right maxillary sinus. Orbital structures and cervical medullary junction unremarkable. No pituitary mass identified.  Slight asymmetry of the  sella. IMPRESSION: No acute infarct or intracranial hemorrhage. No intracranial mass lesion noted on this unenhanced exam. Minimal punctate nonspecific white matter changes frontal lobes. Similar appearance described in patients with migraine headaches. Either tortuous right internal carotid artery cavernous segment versus small aneurysm. MR angiogram circle of Willis or CT angiogram can be obtained for further delineation. Partial opacification left mastoid air cells. Mild prominence adenoidal tissue but without mass causing eustachian tube dysfunction. 2.8 cm polypoid opacification left maxillary sinus may represent retention cyst.  Minimal partial opacification inferior right maxillary sinus. Electronically Signed   By: Lacy Duverney M.D.   On: 05/25/2015 08:32   Mr Maxine Glenn Head/brain Wo Cm  05/25/2015  CLINICAL DATA:  35 year old female with headaches associated with right upper extremity weakness and numbness. Slurred speech. Abnormal MRI appearance of the right ICA cavernous segment earlier today. Initial encounter. EXAM: MRA HEAD WITHOUT CONTRAST TECHNIQUE: Angiographic images of the Circle of Willis were obtained using MRA technique without intravenous contrast. COMPARISON:  Brain MRI 0727 hours today. FINDINGS: Antegrade flow in the posterior circulation with dominant distal left vertebral artery. Normal PICA origins and vertebrobasilar junction. Normal AICA origins. No basilar stenosis. Normal SCA and left PCA origins. Fetal type right PCA origin. Both posterior communicating arteries are present. Bilateral PCA branches are within normal limits. Antegrade flow in both ICA siphons. The contour deformity of the cavernous right ICA seen earlier corresponds to tortuosity, but there is minimal to mild fusiform enlargement of the vessel there, measuring 4-5 mm diameter. Furthermore, in the distal cavernous segment there is a small triangular posteriorly and inferiorly directed area of the vessel best seen on series 3, image 91. The anterior genu, right ophthalmic artery origin, right posterior communicating artery origin, and right ICA terminus then appear normal. The right A1 segment is non dominant. Negative left ICA siphon. Negative left ophthalmic and posterior communicating artery origins. Normal left ICA terminus. Normal MCA origins. Dominant left ACA A1 segment. Anterior communicating artery and visualized ACA branches are within normal limits. Normal MCA M1 segments and bifurcations. Early right MCA branching. Visualized bilateral MCA branches are within normal limits. IMPRESSION: 1. The MRI appearance of the right ICA siphon earlier  today corresponds to tortuosity of the cavernous segment. But in addition there is mild fusiform enlargement of that segment, as well as a probable small cavernous branch infundibulum (series 3, image 91). As such, recommend a repeat noncontrast MRA Head in 1 year to evaluate stability. 2. Otherwise negative intracranial MRA. Electronically Signed   By: Odessa Fleming M.D.   On: 05/25/2015 09:22        Scheduled Meds: . aspirin EC  81 mg Oral Daily  . enoxaparin (LOVENOX) injection  40 mg Subcutaneous Q24H  . topiramate  50 mg Oral BID   Continuous Infusions:       Time spent: 40 minutes.     Kathlen Mody, MD [facility] Pager [phone]  If 7PM-7AM, please contact night-coverage [email or web address] Password [facility] 05/26/2015, 6:46 PM

## 2015-05-26 NOTE — Progress Notes (Signed)
STROKE TEAM PROGRESS NOTE   HISTORY OF PRESENT ILLNESS Dawn [name] is an 35 y.o. female history of migraine headaches presenting with sudden onset of headache with associated right extremity weakness and numbness. Speech was initially slurred but has resolved. She still has mild sensory changes involving right side. She indicated she has not had local deficits of this nature associated with previous headaches. CT scan of her head showed no acute intracranial abnormality. She has not been on antiplatelet therapy. Right-sided weakness resolved, as well as speech output difficulty. NIH stroke score was 1 for residual facial sensory changes on the right. She was last known well 3:30 AM on 05/25/2015. Patient was not administered IV t-PA secondary to rapidly resolving deficits. She was admitted for further evaluation and treatment.   SUBJECTIVE (INTERVAL HISTORY) No family is at the bedside.  Overall she feels her condition is stable.    OBJECTIVE Temp:  [97.9 F (36.6 C)-98.8 F (37.1 C)] 98.8 F (37.1 C) (04/18 1021) Pulse Rate:  [60-96] 75 (04/18 1021) Cardiac Rhythm:  [-]  Resp:  [20] 20 (04/18 1021) BP: (101-136)/(62-98) 136/76 mmHg (04/18 1021) SpO2:  [97 %-99 %] 99 % (04/18 1021)  CBC:   Recent Labs Lab 05/25/15 0450 05/26/15 0706  WBC 8.3 6.6  NEUTROABS 4.0  --   HGB 12.6 12.1  HCT 39.7 37.2  MCV 80.2 79.7  PLT 367 310    Basic Metabolic Panel:   Recent Labs Lab 05/25/15 0450 05/26/15 0706  NA 141 139  K 3.1* 3.2*  CL 103 103  CO2 26 23  GLUCOSE 107* 121*  BUN 10 10  CREATININE 0.97 0.83  CALCIUM 8.7* 8.3*  MG 1.0*  --     Lipid Panel:     Component Value Date/Time   CHOL 171 05/26/2015 0706   TRIG 131 05/26/2015 0706   HDL 34* 05/26/2015 0706   CHOLHDL 5.0 05/26/2015 0706   VLDL 26 05/26/2015 0706   LDLCALC 111* 05/26/2015 0706   HgbA1c: No results found for: HGBA1C Urine Drug Screen: No results found for: LABOPIA, COCAINSCRNUR, LABBENZ, AMPHETMU,  THCU, LABBARB    IMAGING  Dg Chest 2 View  05/25/2015  CLINICAL DATA:  Acute onset headache with right upper extremity weakness and numbness today. Transient slurred speech. Initial encounter. EXAM: CHEST  2 VIEW COMPARISON:  None. FINDINGS: The lungs are clear. Heart size is normal. No pneumothorax or pleural effusion. No focal bony abnormality. IMPRESSION: Negative chest. Electronically Signed   By: Drusilla Kanner M.D.   On: 05/25/2015 08:55   Ct Head Wo Contrast  05/25/2015  CLINICAL DATA:  Code stroke. Headache and difficulty getting words out beginning at 3:30 a.m. EXAM: CT HEAD WITHOUT CONTRAST TECHNIQUE: Contiguous axial images were obtained from the base of the skull through the vertex without intravenous contrast. COMPARISON:  None. FINDINGS: Ventricles and sulci appear symmetrical. No ventricular dilatation. No mass effect or midline shift. No abnormal extra-axial fluid collections. Gray-white matter junctions are distinct. Basal cisterns are not effaced. No evidence of acute intracranial hemorrhage. No depressed skull fractures. Large retention cysts in the left maxillary antrum. Mastoid air cells are not opacified. IMPRESSION: No acute intracranial abnormalities. These results were called by telephone at the time of interpretation on 05/25/2015 at 5:10 am to Dr. Roseanne Reno, who verbally acknowledged these results. Electronically Signed   By: Burman Nieves M.D.   On: 05/25/2015 05:12   Mr Brain Wo Contrast  05/25/2015  CLINICAL DATA:  35 year old female with migraine  headaches presenting with headache and associated right upper extremity weakness and numbness. Slurred speech. Subsequent encounter. EXAM: MRI HEAD WITHOUT CONTRAST TECHNIQUE: Multiplanar, multiecho pulse sequences of the brain and surrounding structures were obtained without intravenous contrast. COMPARISON:  05/25/2015 CT.  No comparison MR FINDINGS: No acute infarct or intracranial hemorrhage. No intracranial mass lesion noted  on this unenhanced exam. No hydrocephalus. Minimal punctate nonspecific white matter changes frontal lobes. Similar appearance described in patients with migraine headaches. Other causes of white matter changes felt to be less likely considerations. Either tortuous right internal carotid artery cavernous segment versus small aneurysm. MR angiogram circle Willis or CT angiogram can be obtained for further delineation. Major intracranial vascular structures are patent. Partial opacification left mastoid air cells. Mild prominence adenoidal tissue but without mass causing eustachian tube dysfunction. 2.8 cm polypoid opacification left maxillary sinus may represent retention cyst. Minimal partial opacification inferior right maxillary sinus. Orbital structures and cervical medullary junction unremarkable. No pituitary mass identified.  Slight asymmetry of the sella. IMPRESSION: No acute infarct or intracranial hemorrhage. No intracranial mass lesion noted on this unenhanced exam. Minimal punctate nonspecific white matter changes frontal lobes. Similar appearance described in patients with migraine headaches. Either tortuous right internal carotid artery cavernous segment versus small aneurysm. MR angiogram circle of Willis or CT angiogram can be obtained for further delineation. Partial opacification left mastoid air cells. Mild prominence adenoidal tissue but without mass causing eustachian tube dysfunction. 2.8 cm polypoid opacification left maxillary sinus may represent retention cyst. Minimal partial opacification inferior right maxillary sinus. Electronically Signed   By: Lacy Duverney M.D.   On: 05/25/2015 08:32   Mr Maxine Glenn Head/brain Wo Cm  05/25/2015  CLINICAL DATA:  35 year old female with headaches associated with right upper extremity weakness and numbness. Slurred speech. Abnormal MRI appearance of the right ICA cavernous segment earlier today. Initial encounter. EXAM: MRA HEAD WITHOUT CONTRAST TECHNIQUE:  Angiographic images of the Circle of Willis were obtained using MRA technique without intravenous contrast. COMPARISON:  Brain MRI 0727 hours today. FINDINGS: Antegrade flow in the posterior circulation with dominant distal left vertebral artery. Normal PICA origins and vertebrobasilar junction. Normal AICA origins. No basilar stenosis. Normal SCA and left PCA origins. Fetal type right PCA origin. Both posterior communicating arteries are present. Bilateral PCA branches are within normal limits. Antegrade flow in both ICA siphons. The contour deformity of the cavernous right ICA seen earlier corresponds to tortuosity, but there is minimal to mild fusiform enlargement of the vessel there, measuring 4-5 mm diameter. Furthermore, in the distal cavernous segment there is a small triangular posteriorly and inferiorly directed area of the vessel best seen on series 3, image 91. The anterior genu, right ophthalmic artery origin, right posterior communicating artery origin, and right ICA terminus then appear normal. The right A1 segment is non dominant. Negative left ICA siphon. Negative left ophthalmic and posterior communicating artery origins. Normal left ICA terminus. Normal MCA origins. Dominant left ACA A1 segment. Anterior communicating artery and visualized ACA branches are within normal limits. Normal MCA M1 segments and bifurcations. Early right MCA branching. Visualized bilateral MCA branches are within normal limits. IMPRESSION: 1. The MRI appearance of the right ICA siphon earlier today corresponds to tortuosity of the cavernous segment. But in addition there is mild fusiform enlargement of that segment, as well as a probable small cavernous branch infundibulum (series 3, image 91). As such, recommend a repeat noncontrast MRA Head in 1 year to evaluate stability. 2. Otherwise negative intracranial MRA.  Electronically Signed   By: Odessa FlemingH  Hall M.D.   On: 05/25/2015 09:22       PHYSICAL  EXAM   ASSESSMENT/PLAN Ms. Lamar SprinklesBetty Lobosco is a 35 y.o. female with history of migraine headaches presenting with headache with associated speech difficulty and right extremity tingling and numbness. She did not receive IV t-PA due to rapidly resolving deficits.   Atypical migraine variant  Resultant  headache  MRI  No acute stroke  MRA  R ICA with mild fusiform enlargement  Carotid Doppler  pending   2D Echo  pending   LDL pending   HgbA1c pending  Lovenox 40 mg sq daily for VTE prophylaxis Diet Heart Room service appropriate?: Yes; Fluid consistency:: Thin  No antithrombotic prior to admission, have added aspirin 81 mg daily  imitrex 6 mg sq x 1 for HA management  Therapy recommendations:  pending   Disposition:  pending   Other Stroke Risk Factors  Obesity, Body mass index is 39.56 kg/(m^2).   Other Active Problems Hypokalemia Elevated BP, improving  Hospital day #    I have personally examined this patient, reviewed notes, independently viewed imaging studies, participated in medical decision making and plan of care. I have made any additions or clarifications directly to the above note. Agree with note above. She presented with severe headache and transient right-sided eakness and speech difficulties which have resolved. This likely represents complicated migraine episode as the brain MRI scan is negative for acute infarct and she had similar headache episodes in the past when she was on hormone replacement therapy. Recommend tramadol 100 mg q 6hrly prn and Topamax 50 mg twice daily  To treat her headache and continue coated aspirin 81 mg daily.  DC later today if stable Follow-up as an outpatient i neurology clinic with Dr Frances FurbishAthar.  Delia HeadyPramod Sethi, MD Medical Director [facility] Pager: [phone] 05/26/2015 3:41 PM    To contact Stroke Continuity provider, please refer to [email or web address]. After hours, contact General Neurology

## 2015-05-26 NOTE — Progress Notes (Signed)
  Echocardiogram 2D Echocardiogram has been performed.  Dawn SavoyCasey N Arrington [name] 05/26/2015, 12:53 PM

## 2015-05-26 NOTE — Evaluation (Signed)
Occupational Therapy Evaluation Patient Details Name: Dawn [name] MRN: [id number] DOB: 08/01/1980 Today's Date: 05/26/2015    History of Present Illness Dawn [name] is a 35 y.o. female presenting to ED with acute left-sided headache, right sided weakness, and speech difficulties. Patient was driving home to [place] during the night when she developed a sharp pain in left forehead. Pain occurred around 3:30 after looking at headlights shining in side mirror. The sharp pain was immediately followed by a sense of heaviness in the right side of body. She noticed right upper extremity and right lower extremity weakness and subsequently developed shortness of breath.   Clinical Impression   Pt admitted with above. She demonstrates the below listed deficits and will benefit from continued OT to maximize safety and independence with BADLs.  Pt presents to OT with bil. UE, LE and truncal tremors Rt > Lt, Impaired Rt visual and bil. Superior fields per confrontation testing. She denies pain.   She is able to perform ADLs with min guard assist, but functional mobility limited due to worsening of tremors while ambulating.   Inconsistencies note.   Will follow acutely.  Anticipate she will progress to modified independence       Follow Up Recommendations  No OT follow up;Supervision/Assistance - 24 hour    Equipment Recommendations  Tub/shower seat    Recommendations for Other Services       Precautions / Restrictions Precautions Precautions: Fall Precaution Comments: Tremors Rt > Lt       Mobility Bed Mobility Overal bed mobility: Modified Independent                Transfers Overall transfer level: Needs assistance   Transfers: Sit to/from Stand;Stand Pivot Transfers Sit to Stand: Min guard Stand pivot transfers: Min guard       General transfer comment: Pt with full body tremors Rt side worse than left side  causing instability with standing and ambulation.       Balance                                            ADL Overall ADL's : Needs assistance/impaired Eating/Feeding: Set up;Sitting   Grooming: Wash/dry hands;Wash/dry face;Oral care;Brushing hair;Min guard;Standing   Upper Body Bathing: Set up;Sitting   Lower Body Bathing: Min guard;Sit to/from stand   Upper Body Dressing : Set up;Sitting   Lower Body Dressing: Min guard;Sit to/from stand   Toilet Transfer: Min guard;Ambulation;Comfort height toilet;RW   Toileting- Architect and Hygiene: Min guard;Sit to/from stand       Functional mobility during ADLs: Min guard General ADL Comments: Pt requires min guard assist due to tremors and unsteadiness      Vision Vision Assessment?: Yes Eye Alignment: Within Functional Limits Alignment/Gaze Preference: Within Defined Limits Tracking/Visual Pursuits: Able to track stimulus in all quads without difficulty Visual Fields: Other (comment) Additional Comments: Pt initially states that she can't see objects on her Lt and Rt during confrontation testing.  During testing, she was inconsistent with responses, but decreased response rate on the Rt.  missed objects in both superior fields.  Pt able to use cell phone independently    Perception Perception Perception Tested?: Yes   Praxis Praxis Praxis tested?: Within functional limits    Pertinent Vitals/Pain Pain Assessment: No/denies pain     Hand Dominance Right   Extremity/Trunk Assessment Upper Extremity Assessment Upper  Extremity Assessment: RUE deficits/detail;LUE deficits/detail RUE Deficits / Details: Pt with tremors bil. UEs Rt > Lt.  Present at all times during OT eval, but stopped during visual assessment  RUE Coordination: decreased gross motor;decreased fine motor LUE Deficits / Details: Pt with tremors bil. UEs Rt > Lt.  Present at all times during OT eval, but stopped during visual assessment  LUE Coordination: decreased fine  motor;decreased gross motor   Lower Extremity Assessment Lower Extremity Assessment: RLE deficits/detail;LLE deficits/detail RLE Deficits / Details: tremors noted - not present during PT eval, worsened when ambulating and Rt > Lt  LLE Deficits / Details: rsened when ambulating and Rt > Lt   Cervical / Trunk Assessment Cervical / Trunk Assessment: Other exceptions Cervical / Trunk Exceptions: truncal tremors noted when ambulating    Communication Communication Communication: No difficulties   Cognition Arousal/Alertness: Awake/alert Behavior During Therapy: WFL for tasks assessed/performed (guarded ) Overall Cognitive Status: Within Functional Limits for tasks assessed                     General Comments       Exercises       Shoulder Instructions      Home Living Family/patient expects to be discharged to:: Private residence Living Arrangements: Children;Other relatives (dtr is 41 yo and sister who works, mom in from out of town) Available Help at Discharge: Family;Available 24 hours/day Type of Home: House Home Access: Level entry     Home Layout: Multi-level Alternate Level Stairs-Number of Steps: flight Alternate Level Stairs-Rails: Left Bathroom Shower/Tub: Tub/shower unit Shower/tub characteristics: Engineer, building services: Standard     Home Equipment: None          Prior Functioning/Environment Level of Independence: Independent        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    OT Diagnosis: Generalized weakness;Other (comment);Disturbance of vision (tremors )   OT Problem List: Decreased activity tolerance;Impaired balance (sitting and/or standing);Impaired vision/perception;Decreased coordination;Decreased knowledge of use of DME or AE;Obesity   OT Treatment/Interventions: Self-care/ADL training;Neuromuscular education;DME and/or AE instruction;Visual/perceptual remediation/compensation;Patient/family education;Balance  training    OT Goals(Current goals can be found in the care plan section) Acute Rehab OT Goals Patient Stated Goal: to get better OT Goal Formulation: With patient Time For Goal Achievement: 06/09/15 Potential to Achieve Goals: Good ADL Goals Pt Will Perform Grooming: with modified independence;standing Pt Will Perform Upper Body Bathing: with modified independence;standing Pt Will Perform Lower Body Bathing: with modified independence;sit to/from stand Pt Will Perform Upper Body Dressing: with modified independence;sitting Pt Will Perform Lower Body Dressing: with modified independence;sit to/from stand Pt Will Transfer to Toilet: with modified independence;ambulating;regular height toilet;bedside commode Pt Will Perform Toileting - Clothing Manipulation and hygiene: with min assist;sit to/from stand Pt Will Perform Tub/Shower Transfer: Tub transfer;with modified independence;ambulating;shower seat  OT Frequency: Min 2X/week   Barriers to D/C:            Co-evaluation              End of Session Equipment Utilized During Treatment: Gait belt Nurse Communication: Mobility status  Activity Tolerance: Patient tolerated treatment well Patient left: in bed;with call bell/phone within reach;with family/visitor present   Time: 1610-9604 OT Time Calculation (min): 20 min Charges:  OT General Charges $OT Visit: 1 Procedure OT Evaluation $OT Eval Moderate Complexity: 1 Procedure G-Codes: OT G-codes **NOT FOR INPATIENT CLASS** Functional Limitation: Self care Self Care Current Status (V4098): At least 1 percent but  less than 20 percent impaired, limited or restricted Self Care Goal Status (Z6109(G8988): 0 percent impaired, limited or restricted  Sakira Dahmer M 05/26/2015, 5:19 PM

## 2015-05-26 NOTE — Progress Notes (Signed)
VASCULAR LAB PRELIMINARY  PRELIMINARY  PRELIMINARY  PRELIMINARY  Carotid duplex completed.    Bilaterally No evidence of Stenosis in ICA. Vertebral artery antegraded.   Jenetta Logesami Dlynn Ranes, RVT, RDMS 05/26/2015, 9:57 AM

## 2015-05-26 NOTE — Progress Notes (Signed)
OT Cancellation Note  Patient Details Name: Lamar SprinklesBetty Sproles MRN: [id number] DOB: 04-17-1980   Cancelled Treatment:    Reason Eval/Treat Not Completed: Patient at procedure or test/ unavailable  Angelene GiovanniConarpe, Annais Crafts M  Imara Standiford Mansfieldonarpe, OTR/L [phone]   05/26/2015, 12:29 PM

## 2015-05-26 NOTE — Evaluation (Signed)
Physical Therapy Evaluation Patient Details Name: Dawn [name] MRN: [id number] DOB: 10/12/1980 Today's Date: 05/26/2015   History of Present Illness  Dawn [name] is a 35 y.o. female presenting to ED with acute left-sided headache, right sided weakness, and speech difficulties. Patient was driving home to [place] during the night when she developed a sharp pain in left forehead. Pain occurred around 3:30 after looking at headlights shining in side mirror. The sharp pain was immediately followed by a sense of heaviness in the right side of body. She noticed right upper extremity and right lower extremity weakness and subsequently developed shortness of breath.  Clinical Impression  Pt admitted with above, MRI and CT of head negative however pt reporting of blurred and quadruple vision during stair negotiation and ambulation and demo's minimal L sided peripheral vision. Due to onset of lightheaded and dizziness pt with impaired balance and requires minA for safe transfers. Recommend 24/7 supervision as pt at increased falls risk as noted by score of 15 on DGI. Acute PT to con't to follow.    Follow Up Recommendations Home health PT;Supervision/Assistance - 24 hour    Equipment Recommendations   (TBD, may benefit from RW)    Recommendations for Other Services       Precautions / Restrictions Precautions Precautions: Fall Precaution Comments: impaired vision Restrictions Weight Bearing Restrictions: No      Mobility  Bed Mobility Overal bed mobility: Modified Independent             General bed mobility comments: HOB elevated and used bed rail  Transfers Overall transfer level: Needs assistance Equipment used: None Transfers: Sit to/from Stand Sit to Stand: Min guard         General transfer comment: pt slow, increased time, guarded  Ambulation/Gait Ambulation/Gait assistance: Min guard;Min assist Ambulation Distance (Feet): 150 Feet Assistive device: 1 person hand  held assist Gait Pattern/deviations: Step-to pattern;Decreased stride length;Narrow base of support Gait velocity: slow and guarded Gait velocity interpretation: Below normal speed for age/gender General Gait Details: slow, guarded, reports "i'm more shaky than normal." no overt episode of LOB however pt reports of mild lightheadedness  Stairs Stairs: Yes Stairs assistance: Min assist Stair Management: One rail Left Number of Stairs: 6 General stair comments: pt became lightheaded and began to see double then quadruple. pt sat down on stairs and it resolved. pt able to descend stairs and walk back to room, pt did report that she began to see double again  Wheelchair Mobility    Modified Rankin (Stroke Patients Only) Modified Rankin (Stroke Patients Only) Pre-Morbid Rankin Score: No symptoms Modified Rankin: Moderate disability     Balance Overall balance assessment: Needs assistance                               Standardized Balance Assessment Standardized Balance Assessment : Dynamic Gait Index   Dynamic Gait Index Level Surface: Mild Impairment Change in Gait Speed: Mild Impairment Gait with Horizontal Head Turns: Mild Impairment Gait with Vertical Head Turns: Moderate Impairment (unable to look up as she becomes dizzy) Gait and Pivot Turn: Mild Impairment Step Over Obstacle: Mild Impairment Step Around Obstacles: Mild Impairment Steps: Mild Impairment Total Score: 15       Pertinent Vitals/Pain Pain Assessment: 0-10 Pain Score: 7  Pain Location: headache Pain Intervention(s): Premedicated before session    Home Living Family/patient expects to be discharged to:: Private residence Living Arrangements: Children;Other relatives (dtr is  1 yo and sister who works, mom in from out of town) Available Help at Discharge: Family;Available 24 hours/day Type of Home: House Home Access: Level entry     Home Layout: Multi-level Home Equipment: None       Prior Function Level of Independence: Independent         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     Hand Dominance   Dominant Hand: Right    Extremity/Trunk Assessment   Upper Extremity Assessment: Overall WFL for tasks assessed           Lower Extremity Assessment: Overall WFL for tasks assessed      Cervical / Trunk Assessment: Normal  Communication   Communication: No difficulties  Cognition Arousal/Alertness: Awake/alert Behavior During Therapy: WFL for tasks assessed/performed (however became tearful x2 t/o PT session) Overall Cognitive Status: Within Functional Limits for tasks assessed                      General Comments General comments (skin integrity, edema, etc.): during vision testing pt appears to have minimal L sided peripheral vision in which pt became tearful. pt able to track fully to left and right    Exercises        Assessment/Plan    PT Assessment Patient needs continued PT services  PT Diagnosis Difficulty walking;Generalized weakness   PT Problem List Decreased strength;Decreased activity tolerance;Decreased balance;Decreased mobility  PT Treatment Interventions DME instruction;Gait training;Stair training;Functional mobility training;Therapeutic activities;Therapeutic exercise;Balance training   PT Goals (Current goals can be found in the Care Plan section) Acute Rehab PT Goals Patient Stated Goal: to get better PT Goal Formulation: With patient Time For Goal Achievement: 06/02/15 Potential to Achieve Goals: Good    Frequency Min 4X/week   Barriers to discharge        Co-evaluation               End of Session Equipment Utilized During Treatment: Gait belt Activity Tolerance:  (limited by dizzines and lightheaded ness) Patient left: with call bell/phone within reach;in chair;with chair alarm set;with family/visitor present Nurse Communication: Mobility status    Functional Assessment  Tool Used: clinical judgement Functional Limitation: Mobility: Walking and moving around Mobility: Walking and Moving Around Current Status [phone]): At least 1 percent but less than 20 percent impaired, limited or restricted Mobility: Walking and Moving Around Goal Status [phone]): At least 1 percent but less than 20 percent impaired, limited or restricted    Time: 1025-1047 PT Time Calculation (min) (ACUTE ONLY): 22 min   Charges:   PT Evaluation $PT Eval Moderate Complexity: 1 Procedure     PT G Codes:   PT G-Codes **NOT FOR INPATIENT CLASS** Functional Assessment Tool Used: clinical judgement Functional Limitation: Mobility: Walking and moving around Mobility: Walking and Moving Around Current Status (U9811): At least 1 percent but less than 20 percent impaired, limited or restricted Mobility: Walking and Moving Around Goal Status [phone]): At least 1 percent but less than 20 percent impaired, limited or restricted    Marcene Brawn 05/26/2015, 2:00 PM   Lewis Shock, PT, DPT Pager #: [phone] Office #: [phone]

## 2015-05-27 DIAGNOSIS — I10 Essential (primary) hypertension: Secondary | ICD-10-CM | POA: Diagnosis not present

## 2015-05-27 DIAGNOSIS — R51 Headache: Secondary | ICD-10-CM | POA: Diagnosis not present

## 2015-05-27 DIAGNOSIS — R2990 Unspecified symptoms and signs involving the nervous system: Secondary | ICD-10-CM | POA: Diagnosis not present

## 2015-05-27 LAB — HEMOGLOBIN A1C
HEMOGLOBIN A1C: 6.2 % — AB (ref 4.8–5.6)
Mean Plasma Glucose: 131 mg/dL

## 2015-05-27 LAB — BASIC METABOLIC PANEL
ANION GAP: 9 (ref 5–15)
BUN: 12 mg/dL (ref 6–20)
CALCIUM: 8.4 mg/dL — AB (ref 8.9–10.3)
CO2: 26 mmol/L (ref 22–32)
Chloride: 102 mmol/L (ref 101–111)
Creatinine, Ser: 0.89 mg/dL (ref 0.44–1.00)
Glucose, Bld: 95 mg/dL (ref 65–99)
Potassium: 3.6 mmol/L (ref 3.5–5.1)
Sodium: 137 mmol/L (ref 135–145)

## 2015-05-27 MED ORDER — TRAMADOL HCL 50 MG PO TABS
100.0000 mg | ORAL_TABLET | Freq: Four times a day (QID) | ORAL | Status: DC | PRN
Start: 2015-05-27 — End: 2016-12-28

## 2015-05-27 MED ORDER — TOPIRAMATE 50 MG PO TABS: 50.0000 mg | ORAL_TABLET | Freq: Two times a day (BID) | ORAL | Status: DC

## 2015-05-27 NOTE — Progress Notes (Signed)
Physical Therapy Treatment Patient Details Name: Dawn [name] MRN: [id number] DOB: 07-Dec-1980 Today's Date: 05/27/2015    History of Present Illness Dawn [name] is a 35 y.o. female presenting to ED with acute left-sided headache, right sided weakness, and speech difficulties. Patient was driving home to [place] during the night when she developed a sharp pain in left forehead. Pain occurred around 3:30 after looking at headlights shining in side mirror. The sharp pain was immediately followed by a sense of heaviness in the right side of body. She noticed right upper extremity and right lower extremity weakness and subsequently developed shortness of breath.    PT Comments    Pt with improved ambulation tolerance and vision however became nauseated and had increased pain to 10/10 in form of headache at the sound of PTs pager going off. 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 Recommended she re-prioritize to decrease obligations to decreased stress. Acute PT to con't to follow to progress indep with mobility.  Follow Up Recommendations  Home health PT;Supervision/Assistance - 24 hour     Equipment Recommendations  None recommended by PT    Recommendations for Other Services       Precautions / Restrictions Precautions Precautions: Fall Precaution Comments: inconsistenties with presentations Restrictions Weight Bearing Restrictions: No    Mobility  Bed Mobility Overal bed mobility: Modified Independent             General bed mobility comments: HOB elevated and used bed rail  Transfers Overall transfer level: Needs assistance Equipment used: None Transfers: Sit to/from Stand Sit to Stand: Supervision         General transfer comment: pt with no instability  Ambulation/Gait Ambulation/Gait assistance: Supervision;Min guard Ambulation Distance (Feet): 200 Feet Assistive device:  None Gait Pattern/deviations: Step-through pattern;WFL(Within Functional Limits)   Gait velocity interpretation: Below normal speed for age/gender General Gait Details: slow, guarded, PT's pager goes off and pt reports worsening of head ache to 10/10 and became nauseated requring a seated rest break. once seated all isues resolved and pt able to walk back to room   Stairs            Wheelchair Mobility    Modified Rankin (Stroke Patients Only) Modified Rankin (Stroke Patients Only) Pre-Morbid Rankin Score: No symptoms Modified Rankin: Slight disability     Balance                                    Cognition Arousal/Alertness: Awake/alert Behavior During Therapy: WFL for tasks assessed/performed Overall Cognitive Status: Within Functional Limits for tasks assessed                      Exercises      General Comments General comments (skin integrity, edema, etc.): pt no longer reporting double or quadruple vision. Pt does reports sounds and photosensitivity      Pertinent Vitals/Pain Pain Assessment: 0-10 Pain Score: 7  Pain Location: headache Pain Descriptors / Indicators: Shooting Pain Intervention(s): Premedicated before session    Home Living                      Prior Function            PT Goals (current goals can now be found in the care plan section) Acute Rehab PT Goals  Patient Stated Goal: to get better Progress towards PT goals: Progressing toward goals    Frequency  Min 3X/week    PT Plan Frequency needs to be updated;Current plan remains appropriate    Co-evaluation             End of Session Equipment Utilized During Treatment: Gait belt Activity Tolerance: Patient limited by pain Patient left: in bed;with call bell/phone within reach;with family/visitor present     Time: 0950-1009 PT Time Calculation (min) (ACUTE ONLY): 19 min  Charges:  $Gait Training: 8-22 mins                    G Codes:       Marcene BrawnChadwell, Dawn [name] 05/27/2015, 10:32 AM   Lewis ShockAshly Savina [name], PT, DPT Pager #: [phone] Office #: [phone]

## 2015-05-27 NOTE — Progress Notes (Signed)
STROKE TEAM PROGRESS NOTE   HISTORY OF PRESENT ILLNESS Dawn [name] is an 35 y.o. female history of migraine headaches presenting with sudden onset of headache with associated right extremity weakness and numbness. Speech was initially slurred but has resolved. She still has mild sensory changes involving right side. She indicated she has not had local deficits of this nature associated with previous headaches. CT scan of her head showed no acute intracranial abnormality. She has not been on antiplatelet therapy. Right-sided weakness resolved, as well as speech output difficulty. NIH stroke score was 1 for residual facial sensory changes on the right. She was last known well 3:30 AM on 05/25/2015. Patient was not administered IV t-PA secondary to rapidly resolving deficits. She was admitted for further evaluation and treatment.   SUBJECTIVE (INTERVAL HISTORY) Mother is at the bedside.  Overall she feels her condition is Improving but she still has residual headache. Tramodol and Topamax seemed to be helping   OBJECTIVE Temp:  [97.9 F (36.6 C)-98.4 F (36.9 C)] 97.9 F (36.6 C) (04/19 0938) Pulse Rate:  [74-88] 84 (04/19 0938) Cardiac Rhythm:  [-] Normal sinus rhythm (04/19 0700) Resp:  [16-20] 16 (04/19 0938) BP: (102-119)/(55-70) 102/62 mmHg (04/19 0938) SpO2:  [96 %-100 %] 99 % (04/19 0938)  CBC:   Recent Labs Lab 05/25/15 0450 05/26/15 0706  WBC 8.3 6.6  NEUTROABS 4.0  --   HGB 12.6 12.1  HCT 39.7 37.2  MCV 80.2 79.7  PLT 367 310    Basic Metabolic Panel:   Recent Labs Lab 05/25/15 0450 05/26/15 0706 05/27/15 0230  NA 141 139 137  K 3.1* 3.2* 3.6  CL 103 103 102  CO2 26 23 26   GLUCOSE 107* 121* 95  BUN 10 10 12   CREATININE 0.97 0.83 0.89  CALCIUM 8.7* 8.3* 8.4*  MG 1.0*  --   --     Lipid Panel:     Component Value Date/Time   CHOL 171 05/26/2015 0706   TRIG 131 05/26/2015 0706   HDL 34* 05/26/2015 0706   CHOLHDL 5.0 05/26/2015 0706   VLDL 26 05/26/2015  0706   LDLCALC 111* 05/26/2015 0706   HgbA1c:  Lab Results  Component Value Date   HGBA1C 6.2* 05/26/2015   Urine Drug Screen: No results found for: LABOPIA, COCAINSCRNUR, LABBENZ, AMPHETMU, THCU, LABBARB    IMAGING  No results found.     PHYSICAL EXAM Obese young African-American lady not in distress. . Afebrile. Head is nontraumatic. Neck is supple without bruit.    Cardiac exam no murmur or gallop. Lungs are clear to auscultation. Distal pulses are well felt. Neurological Exam ;  Awake  Alert oriented x 3. Normal speech and language.eye movements full without nystagmus.fundi were not visualized. Vision acuity and fields appear normal. Hearing is normal. Palatal movements are normal. Face symmetric. Tongue midline. Normal strength, tone, reflexes and coordination. Normal sensation. Gait deferred. ASSESSMENT/PLAN Dawn [name] is a 35 y.o. female with history of migraine headaches presenting with headache with associated speech difficulty and right extremity tingling and numbness. She did not receive IV t-PA due to rapidly resolving deficits.   Atypical migraine variant  Resultant  headache  MRI  No acute stroke  MRA  R ICA with mild fusiform enlargement  Carotid Doppler  pending   2D Echo  pending   LDL pending   HgbA1c pending  Lovenox 40 mg sq daily for VTE prophylaxis Diet Heart Room service appropriate?: Yes; Fluid consistency:: Thin Diet general  No antithrombotic prior to admission, have added aspirin 81 mg daily  imitrex 6 mg sq x 1 for HA management  Therapy recommendations:  pending   Disposition:  pending   Other Stroke Risk Factors  Obesity, Body mass index is 39.56 kg/(m^2).   Other Active Problems Hypokalemia Elevated BP, improving  Hospital day #     Continue ttramadol 100 mg q 6hrly prn and Topamax 50 mg twice daily  To treat her headache and continue coated aspirin 81 mg daily.  DC later today if stable Follow-up as an outpatient i  neurology clinic with Dr Danae Orleans, MD Medical Director [facility] Pager: [phone] 05/27/2015 3:26 PM    To contact Stroke Continuity provider, please refer to [email or web address]. After hours, contact General Neurology

## 2015-05-27 NOTE — Discharge Summary (Signed)
Physician Discharge Summary  Dawn [name] ZOX:[id number] DOB: 10-04-1980 DOA: 05/25/2015  PCP: Dawn Harness, MD  Admit date: 05/25/2015 Discharge date: 05/27/2015  Time spent: 30 minutes  Recommendations for Outpatient Follow-up:  1. Follow up with PCP in 1 to 2 days.  2. Follow up with neurology as outpatient.   Discharge Diagnoses:  Active Problems:   Stroke-like symptoms   Cephalalgia   Essential hypertension   Discharge Condition: improved  Diet recommendation: regular  Filed Weights   05/25/15 0514  Weight: 111.131 kg (245 lb)    History of present illness:  Dawn [name] is a 35 y.o. female with a Past Medical History of hysterectomy and breast reduction who presents with HA and stroke like symptoms. Her MRI brain is negative for stroke , neurology consulted and recommendations given.  Hospital Course:  Complex migraines: Started on topomax and tramadol.  Neurology consulted and appreciate recommendations.  Outpatient neurology follow up in the clinic.  Pain control.    Hypertension: well controlled.   Hypokalemia: replete as needed. Repeat in am is normal.   Procedures:  MRI brain.   Consultations:  neurology  Discharge Exam: Filed Vitals:   05/27/15 0545 05/27/15 0938  BP: 110/60 102/62  Pulse: 81 84  Temp: 97.9 F (36.6 C) 97.9 F (36.6 C)  Resp: 18 16    General: alert comfortable.  Cardiovascular: s1s2 Respiratory: ctab  Discharge Instructions   Discharge Instructions    Ambulatory referral to Neurology    Complete by:  As directed   For follow up appt in 1 mon - sethi saw in the hospital because they thought she was a stroke, but she was not = had a migraine. Needs HA f/u     Ambulatory referral to Neurology    Complete by:  As directed   Complicated migraine     Diet general    Complete by:  As directed      Discharge instructions    Complete by:  As directed   Follow up with NEUROLOGY as recommended.          Current  Discharge Medication List    START taking these medications   Details  topiramate (TOPAMAX) 50 MG tablet Take 1 tablet (50 mg total) by mouth 2 (two) times daily. Qty: 60 tablet, Refills: 0    traMADol (ULTRAM) 50 MG tablet Take 2 tablets (100 mg total) by mouth every 6 (six) hours as needed for moderate pain or severe pain. Qty: 30 tablet, Refills: 0      CONTINUE these medications which have NOT CHANGED   Details  cholecalciferol (VITAMIN D) 1000 units tablet Take 1,000 Units by mouth daily.    Cyanocobalamin (VITAMIN B-12 PO) Take 1 tablet by mouth daily.    ferrous sulfate 325 (65 FE) MG tablet Take 325 mg by mouth daily with breakfast.    Multiple Vitamin (MULTIVITAMIN WITH MINERALS) TABS tablet Take 1 tablet by mouth daily.    psyllium (HYDROCIL/METAMUCIL) 95 % PACK Take 1 packet by mouth daily.       Allergies  Allergen Reactions  . Claritin [Loratadine] Other (See Comments)    Tremors   Follow-up Information    Follow up with Dawn Fret, MD In 2 months.   Specialty:  Neurology   Why:  Stroke Clinic, Office will call you with appointment date & time   Contact information:   [address] [place] [address] [phone]       Follow up  with Dawn HarnessBRISCOE, KIM, MD. Schedule an appointment as soon as possible for a visit in 1 week.   Specialty:  Family Medicine   Contact information:   [address] [place] [address] [phone]        The results of significant diagnostics from this hospitalization (including imaging, microbiology, ancillary and laboratory) are listed below for reference.    Significant Diagnostic Studies: Dg Chest 2 View  05/25/2015  CLINICAL DATA:  Acute onset headache with right upper extremity weakness and numbness today. Transient slurred speech. Initial encounter. EXAM: CHEST  2 VIEW COMPARISON:  None. FINDINGS: The lungs are clear. Heart size is normal. No pneumothorax or pleural effusion. No focal  bony abnormality. IMPRESSION: Negative chest. Electronically Signed   By: Drusilla Kannerhomas  Dalessio M.D.   On: 05/25/2015 08:55   Ct Head Wo Contrast  05/25/2015  CLINICAL DATA:  Code stroke. Headache and difficulty getting words out beginning at 3:30 a.m. EXAM: CT HEAD WITHOUT CONTRAST TECHNIQUE: Contiguous axial images were obtained from the base of the skull through the vertex without intravenous contrast. COMPARISON:  None. FINDINGS: Ventricles and sulci appear symmetrical. No ventricular dilatation. No mass effect or midline shift. No abnormal extra-axial fluid collections. Gray-white matter junctions are distinct. Basal cisterns are not effaced. No evidence of acute intracranial hemorrhage. No depressed skull fractures. Large retention cysts in the left maxillary antrum. Mastoid air cells are not opacified. IMPRESSION: No acute intracranial abnormalities. These results were called by telephone at the time of interpretation on 05/25/2015 at 5:10 am to Dr. Roseanne RenoStewart, who verbally acknowledged these results. Electronically Signed   By: Burman NievesWilliam  Stevens M.D.   On: 05/25/2015 05:12   Mr Brain Wo Contrast  05/25/2015  CLINICAL DATA:  35 year old female with migraine headaches presenting with headache and associated right upper extremity weakness and numbness. Slurred speech. Subsequent encounter. EXAM: MRI HEAD WITHOUT CONTRAST TECHNIQUE: Multiplanar, multiecho pulse sequences of the brain and surrounding structures were obtained without intravenous contrast. COMPARISON:  05/25/2015 CT.  No comparison MR FINDINGS: No acute infarct or intracranial hemorrhage. No intracranial mass lesion noted on this unenhanced exam. No hydrocephalus. Minimal punctate nonspecific white matter changes frontal lobes. Similar appearance described in patients with migraine headaches. Other causes of white matter changes felt to be less likely considerations. Either tortuous right internal carotid artery cavernous segment versus small aneurysm.  MR angiogram circle Willis or CT angiogram can be obtained for further delineation. Major intracranial vascular structures are patent. Partial opacification left mastoid air cells. Mild prominence adenoidal tissue but without mass causing eustachian tube dysfunction. 2.8 cm polypoid opacification left maxillary sinus may represent retention cyst. Minimal partial opacification inferior right maxillary sinus. Orbital structures and cervical medullary junction unremarkable. No pituitary mass identified.  Slight asymmetry of the sella. IMPRESSION: No acute infarct or intracranial hemorrhage. No intracranial mass lesion noted on this unenhanced exam. Minimal punctate nonspecific white matter changes frontal lobes. Similar appearance described in patients with migraine headaches. Either tortuous right internal carotid artery cavernous segment versus small aneurysm. MR angiogram circle of Willis or CT angiogram can be obtained for further delineation. Partial opacification left mastoid air cells. Mild prominence adenoidal tissue but without mass causing eustachian tube dysfunction. 2.8 cm polypoid opacification left maxillary sinus may represent retention cyst. Minimal partial opacification inferior right maxillary sinus. Electronically Signed   By: Lacy DuverneySteven  Olson M.D.   On: 05/25/2015 08:32   Mr Maxine GlennMra Head/brain Wo Cm  05/25/2015  CLINICAL DATA:  35 year old female with  headaches associated with right upper extremity weakness and numbness. Slurred speech. Abnormal MRI appearance of the right ICA cavernous segment earlier today. Initial encounter. EXAM: MRA HEAD WITHOUT CONTRAST TECHNIQUE: Angiographic images of the Circle of Willis were obtained using MRA technique without intravenous contrast. COMPARISON:  Brain MRI 0727 hours today. FINDINGS: Antegrade flow in the posterior circulation with dominant distal left vertebral artery. Normal PICA origins and vertebrobasilar junction. Normal AICA origins. No basilar stenosis.  Normal SCA and left PCA origins. Fetal type right PCA origin. Both posterior communicating arteries are present. Bilateral PCA branches are within normal limits. Antegrade flow in both ICA siphons. The contour deformity of the cavernous right ICA seen earlier corresponds to tortuosity, but there is minimal to mild fusiform enlargement of the vessel there, measuring 4-5 mm diameter. Furthermore, in the distal cavernous segment there is a small triangular posteriorly and inferiorly directed area of the vessel best seen on series 3, image 91. The anterior genu, right ophthalmic artery origin, right posterior communicating artery origin, and right ICA terminus then appear normal. The right A1 segment is non dominant. Negative left ICA siphon. Negative left ophthalmic and posterior communicating artery origins. Normal left ICA terminus. Normal MCA origins. Dominant left ACA A1 segment. Anterior communicating artery and visualized ACA branches are within normal limits. Normal MCA M1 segments and bifurcations. Early right MCA branching. Visualized bilateral MCA branches are within normal limits. IMPRESSION: 1. The MRI appearance of the right ICA siphon earlier today corresponds to tortuosity of the cavernous segment. But in addition there is mild fusiform enlargement of that segment, as well as a probable small cavernous branch infundibulum (series 3, image 91). As such, recommend a repeat noncontrast MRA Head in 1 year to evaluate stability. 2. Otherwise negative intracranial MRA. Electronically Signed   By: Odessa Fleming M.D.   On: 05/25/2015 09:22    Microbiology: No results found for this or any previous visit (from the past 240 hour(s)).   Labs: Basic Metabolic Panel:  Recent Labs Lab 05/25/15 0449 05/25/15 0450 05/26/15 0706 05/27/15 0230  NA 142 141 139 137  K 3.3* 3.1* 3.2* 3.6  CL 100* 103 103 102  CO2  --  GLUCOSE 101* 107* 121* 95  BUN CREATININE 0.90 0.97 0.83 0.89   CALCIUM  --  8.7* 8.3* 8.4*  MG  --  1.0*  --   --    Liver Function Tests:  Recent Labs Lab 05/25/15 0450  AST 23  ALT 22  ALKPHOS 48  BILITOT 0.4  PROT 7.6  ALBUMIN 4.0   No results for input(s): LIPASE, AMYLASE in the last 168 hours. No results for input(s): AMMONIA in the last 168 hours. CBC:  Recent Labs Lab 05/25/15 0449 05/25/15 0450 05/26/15 0706  WBC  --  8.3 6.6  NEUTROABS  --  4.0  --   HGB 15.0 12.6 12.1  HCT 44.0 39.7 37.2  MCV  --  80.2 79.7  PLT  --  367 310   Cardiac Enzymes: No results for input(s): CKTOTAL, CKMB, CKMBINDEX, TROPONINI in the last 168 hours. BNP: BNP (last 3 results) No results for input(s): BNP in the last 8760 hours.  ProBNP (last 3 results) No results for input(s): PROBNP in the last 8760 hours.  CBG:  Recent Labs Lab 05/25/15 0523  GLUCAP 97       Signed:  Gerianne Simonet MD.  [facility] 05/27/2015, 12:22 PM

## 2015-05-27 NOTE — Progress Notes (Signed)
Pt discharged from hospital per orders from MD. Pt and parent educated on discharge instructions. Pt and parent verbalized understanding of instructions. Pt's IV was removed before discharge. All questions and concerns were addressed by RN. Pt exited hospital via wheelchair.

## 2015-05-27 NOTE — Care Management Note (Signed)
Case Management Note  Patient Details  Name: Chondra Boyde MRN: [id number] Date of Birth: Oct 05, 1980  Subjective/Objective:                    Action/Plan: Patient discharging home with [facility] services. CM met with the patient and provided her a list of [facility] agencies in the [place] area. She selected [facility]. Drew with [facility] notified and accepted the referral. Bedside RN updated.  Expected Discharge Date:                  Expected Discharge Plan:  [place]  In-House Referral:     Discharge planning Services  CM Consult  Post Acute Care Choice:  Home Health Choice offered to:  Patient  DME Arranged:    DME Agency:     HH Arranged:  PT [place]:  [place]  Status of Service:  Completed, signed off  Medicare Important Message Given:    Date Medicare IM Given:    Medicare IM give by:    Date Additional Medicare IM Given:    Additional Medicare Important Message give by:     If discussed at [place] of Stay Meetings, dates discussed:    Additional Comments:  Pollie Friar, RN 05/27/2015, 2:38 PM

## 2015-06-08 ENCOUNTER — Ambulatory Visit: Payer: 59 | Admitting: Neurology

## 2015-08-25 DIAGNOSIS — I10 Essential (primary) hypertension: Secondary | ICD-10-CM | POA: Insufficient documentation

## 2015-12-14 ENCOUNTER — Ambulatory Visit: Payer: 59 | Admitting: Allergy and Immunology

## 2016-01-05 ENCOUNTER — Encounter (INDEPENDENT_AMBULATORY_CARE_PROVIDER_SITE_OTHER): Payer: Self-pay

## 2016-01-05 ENCOUNTER — Encounter: Payer: Self-pay | Admitting: Allergy and Immunology

## 2016-01-05 ENCOUNTER — Ambulatory Visit (INDEPENDENT_AMBULATORY_CARE_PROVIDER_SITE_OTHER): Payer: 59 | Admitting: Allergy and Immunology

## 2016-01-05 VITALS — BP 132/88 | HR 91 | Temp 98.3°F | Resp 18 | Ht 65.25 in | Wt 257.0 lb

## 2016-01-05 DIAGNOSIS — T7840XA Allergy, unspecified, initial encounter: Secondary | ICD-10-CM

## 2016-01-05 DIAGNOSIS — J3089 Other allergic rhinitis: Secondary | ICD-10-CM | POA: Diagnosis not present

## 2016-01-05 DIAGNOSIS — J339 Nasal polyp, unspecified: Secondary | ICD-10-CM | POA: Diagnosis not present

## 2016-01-05 MED ORDER — LEVOCETIRIZINE DIHYDROCHLORIDE 5 MG PO TABS: 5.0000 mg | ORAL_TABLET | ORAL | 5 refills | Status: DC | PRN

## 2016-01-05 MED ORDER — EPINEPHRINE 0.3 MG/0.3ML IJ SOAJ: 0.3000 mg | Freq: Once | INTRAMUSCULAR | 0 refills | Status: AC

## 2016-01-05 NOTE — Assessment & Plan Note (Signed)
   Aeroallergen avoidance measures have been discussed and provided in written form.  A prescription has been provided for levocetirizine, 5mg  daily as needed.  Continue fluticasone nasal spray.  I have also recommended nasal saline spray (i.e., Simply Saline) or nasal saline lavage (i.e., NeilMed) as needed prior to medicated nasal sprays.  If allergen avoidance measures and medications fail to adequately relieve symptoms, aeroallergen immunotherapy will be considered.

## 2016-01-05 NOTE — Progress Notes (Signed)
New Patient Note  RE: Dawn [name] MRN: [id number] DOB: 22-Jul-1980 Date of Office Visit: 01/05/2016  Referring provider: Bernerd Limbo, MD Primary care provider: Phineas Inches, MD  Chief Complaint: Allergic Reaction and Nasal Congestion   History of present illness: Dawn [name] is a 35 y.o. female seen today in consultation requested by Suzanna Obey, MD. Dawn [name] reports that in 2014 she consumed a [employer] donut and within 2 or 3 minutes developed a tingly sensation of the lips followed by lip swelling, hives on the face, and a globus sensation in the throat precipitating repeated throat clearing.  She took diphenhydramine and the symptoms gradually resolved over the course of a few hours without further medical intervention.  The next week, she once again ate [employer] and had a similar experience.  Since that time, she has experienced angioedema, urticaria, and the sensation of throat tightness with the consumption of other donuts on multiple occasions.  The most recent episode occurred on 11/26/2015.  She has never sought evaluation or treatment in an emergency department and does not have an epinephrine autoinjector.  She reports that last month she consumed duck and salad at an [place] and experienced lip swelling, facial hives, and throat clearing. Lenka experiences frequent nasal congestion, sinus pressure, and postnasal drainage.   No significant seasonal symptom variation has been noted nor have specific environmental triggers been identified.She reports that she was diagnosed with sinus polyps in April 2017 by CT scan.  She admits to decreased sense of smell and is followed by her otolaryngologist, Dr. Jannifer Franklin in [place].  She is interested in environmental and food allergen skin testing to further evaluate her allergic status.   Assessment and plan: Allergic reactions The patient's history suggests allergic reaction with an unclear trigger. Food allergen  skin tests were negative today despite a positive histamine control. The negative predictive value for skin tests is excellent (greater than 95%). We will proceed with in vitro lab studies to clarify the etiology.  The following labs have been ordered: FCeRI antibody, TSH, anti-thyroglobulin antibody, thyroid peroxidase antibody, baseline serum tryptase, CBC, CMP, ESR, ANA, and serum specific IgE and galactose-alpha-1,3-galactose. An additional lab order for serum tryptase has been provided which is to be kept by the patient to be drawn in the emergency department within 4 hours of symptom onset should symptoms recur.  Should symptoms recur, the patient has been asked to keep a journal to record any foods eaten, beverages consumed, medications taken within a 6 hour period prior to the onset of symptoms, as well as record activities being performed, and environmental conditions. For any symptoms concerning for anaphylaxis, epinephrine is to be administered and 911 is to be called immediately.  A prescription has been provided for epinephrine 0.3 mg autoinjector 2 pack along with instructions for its proper administration.  Seasonal and perennial allergic rhinitis  Aeroallergen avoidance measures have been discussed and provided in written form.  A prescription has been provided for levocetirizine, 10m daily as needed.  Continue fluticasone nasal spray.  I have also recommended nasal saline spray (i.e., Simply Saline) or nasal saline lavage (i.e., NeilMed) as needed prior to medicated nasal sprays.  If allergen avoidance measures and medications fail to adequately relieve symptoms, aeroallergen immunotherapy will be considered.  Nasal polyps  I have encouraged the use of fluticasone nasal spray on a daily rather than as needed basis.  I have also recommended nasal saline spray (i.e., Simply Saline) or nasal saline lavage (i.e.,  NeilMed) as needed prior to medicated nasal sprays.  Follow up  with otolaryngologist, Dr. Jannifer Franklin, as directed.   Meds ordered this encounter  Medications  . EPINEPHrine (EPIPEN 2-PAK) 0.3 mg/0.3 mL IJ SOAJ injection    Sig: Inject 0.3 mLs (0.3 mg total) into the muscle once.    Dispense:  1 Device    Refill:  0    Dispense generic M[employer] if Brand is not approved  . levocetirizine (XYZAL) 5 MG tablet    Sig: Take 1 tablet (5 mg total) by mouth as needed for allergies.    Dispense:  30 tablet    Refill:  5    Diagnostics: Epicutaneous testing: Positive to grass pollens, ragweed pollens, and tree pollens. Intradermal testing: Positive to ragweed pollen, molds, and dust mite antigen. Food allergen skin testing:  Negative despite a positive histamine control.    Physical examination: Blood pressure 132/88, pulse 91, temperature 98.3 F (36.8 C), temperature source Oral, resp. rate 18, height 5' 5.25" (1.657 m), weight 257 lb (116.6 kg), last menstrual period 06/25/2014, SpO2 93 %.  General: Alert, interactive, in no acute distress. HEENT: TMs pearly gray, turbinates edematous with crusty discharge, post-pharynx erythematous. Neck: Supple without lymphadenopathy. Lungs: Clear to auscultation without wheezing, rhonchi or rales. CV: Normal S1, S2 without murmurs. Abdomen: Nondistended, nontender. Skin: Warm and dry, without lesions or rashes. Extremities:  No clubbing, cyanosis or edema. Neuro:   Grossly intact.  Review of systems:  Review of systems negative except as noted in HPI / PMHx or noted below: Review of Systems  Constitutional: Negative.   HENT: Negative.   Eyes: Negative.   Respiratory: Negative.   Cardiovascular: Negative.   Gastrointestinal: Negative.   Genitourinary: Negative.   Musculoskeletal: Negative.   Skin: Negative.   Neurological: Negative.   Endo/Heme/Allergies: Negative.   Psychiatric/Behavioral: Negative.     Past medical history:  Past Medical History:  Diagnosis Date  . GERD (gastroesophageal reflux  disease)   . Migraine dx'd 05/25/2015  . PONV (postoperative nausea and vomiting)   . Vaginal delivery 2004    Past surgical history:  Past Surgical History:  Procedure Laterality Date  . BREAST REDUCTION SURGERY Bilateral 08/28/2014   Procedure: BILATERAL BREAST  REDUCTION  ;  Surgeon: Crissie Reese, MD;  Location: [facility];  Service: Plastics;  Laterality: Bilateral;  . LAPAROSCOPY N/A 02/18/2013   Procedure: LAPAROSCOPY OPERATIVE;  Surgeon: Olga Millers, MD;  Location: [facility];  Service: Gynecology;  Laterality: N/A;  YAG LASER  . REDUCTION MAMMAPLASTY  08/28/2014  . TONSILLECTOMY  2008  . VAGINAL HYSTERECTOMY  06-26-14  . YAG LASER APPLICATION N/A 5/68/1275   Procedure: YAG LASER APPLICATION;  Surgeon: Olga Millers, MD;  Location: [facility];  Service: Gynecology;  Laterality: N/A;    Family history: Family History  Problem Relation Age of Onset  . Hyperlipidemia Mother   . Hypertension Mother   . Allergic rhinitis Mother   . Asthma Sister   . Allergic rhinitis Sister   . Allergic rhinitis Brother   . Asthma Paternal Uncle     Social history: Social History   Social History  . Marital status: Single    Spouse name: N/A  . Number of children: 1  . Years of education: 77   Occupational History  . PROVIDER [employer]   Social History Main Topics  . Smoking status: Never Smoker  . Smokeless tobacco: Never Used  . Alcohol use No  . Drug use: No  .  Sexual activity: Not Currently   Other Topics Concern  . Not on file   Social History Narrative  . No narrative on file   Environmental History: The patient lives in a 44-year-old townhouse with hardwood floors throughout gas heat, and central air.  There is a dog in the townhouse which has access to her bedroom.  She is a nonsmoker.    Medication List       Accurate as of 01/05/16  7:28 PM. Always use your most recent med list.          cholecalciferol 1000 units tablet Commonly known as:  VITAMIN  D Take 1,000 Units by mouth daily.   EPINEPHrine 0.3 mg/0.3 mL Soaj injection Commonly known as:  EPIPEN 2-PAK Inject 0.3 mLs (0.3 mg total) into the muscle once.   ferrous sulfate 325 (65 FE) MG tablet Take 325 mg by mouth daily with breakfast.   levocetirizine 5 MG tablet Commonly known as:  XYZAL Take 1 tablet (5 mg total) by mouth as needed for allergies.   multivitamin with minerals Tabs tablet Take 1 tablet by mouth daily.   psyllium 95 % Pack Commonly known as:  HYDROCIL/METAMUCIL Take 1 packet by mouth daily.   topiramate 50 MG tablet Commonly known as:  TOPAMAX Take 1 tablet (50 mg total) by mouth 2 (two) times daily.   traMADol 50 MG tablet Commonly known as:  ULTRAM Take 2 tablets (100 mg total) by mouth every 6 (six) hours as needed for moderate pain or severe pain.   valACYclovir 500 MG tablet Commonly known as:  VALTREX Take 500 mg by mouth as needed.   VITAMIN B-12 PO Take 1 tablet by mouth daily.       Known medication allergies: Allergies  Allergen Reactions  . Claritin [Loratadine] Other (See Comments)    Tremors    I appreciate the opportunity to take part in Teletha's care. Please do not hesitate to contact me with questions.  Sincerely,   R. Edgar Frisk, MD

## 2016-01-05 NOTE — Assessment & Plan Note (Signed)
   I have encouraged the use of fluticasone nasal spray on a daily rather than as needed basis.  I have also recommended nasal saline spray (i.e., Simply Saline) or nasal saline lavage (i.e., NeilMed) as needed prior to medicated nasal sprays.  Follow up with otolaryngologist, Dr. Anne HahnWillis, as directed.

## 2016-01-05 NOTE — Patient Instructions (Signed)
Allergic reactions The patient's history suggests allergic reaction with an unclear trigger. Food allergen skin tests were negative today despite a positive histamine control. The negative predictive value for skin tests is excellent (greater than 95%). We will proceed with in vitro lab studies to clarify the etiology.  The following labs have been ordered: FCeRI antibody, TSH, anti-thyroglobulin antibody, thyroid peroxidase antibody, baseline serum tryptase, CBC, CMP, ESR, ANA, and serum specific IgE and galactose-alpha-1,3-galactose. An additional lab order for serum tryptase has been provided which is to be kept by the patient to be drawn in the emergency department within 4 hours of symptom onset should symptoms recur.  Should symptoms recur, the patient has been asked to keep a journal to record any foods eaten, beverages consumed, medications taken within a 6 hour period prior to the onset of symptoms, as well as record activities being performed, and environmental conditions. For any symptoms concerning for anaphylaxis, epinephrine is to be administered and 911 is to be called immediately.  A prescription has been provided for epinephrine 0.3 mg autoinjector 2 pack along with instructions for its proper administration.  Seasonal and perennial allergic rhinitis  Aeroallergen avoidance measures have been discussed and provided in written form.  A prescription has been provided for levocetirizine, 26m daily as needed.  Continue fluticasone nasal spray.  I have also recommended nasal saline spray (i.e., Simply Saline) or nasal saline lavage (i.e., NeilMed) as needed prior to medicated nasal sprays.  If allergen avoidance measures and medications fail to adequately relieve symptoms, aeroallergen immunotherapy will be considered.  Nasal polyps  I have encouraged the use of fluticasone nasal spray on a daily rather than as needed basis.  I have also recommended nasal saline spray (i.e.,  Simply Saline) or nasal saline lavage (i.e., NeilMed) as needed prior to medicated nasal sprays.  Follow up with otolaryngologist, Dr. WJannifer Franklin as directed.   When lab results have returned the patient will be called with further recommendations and follow up instructions.  Reducing Pollen Exposure  The American Academy of Allergy, Asthma and Immunology suggests the following steps to reduce your exposure to pollen during allergy seasons.    1. Do not hang sheets or clothing out to dry; pollen may collect on these items. 2. Do not mow lawns or spend time around freshly cut grass; mowing stirs up pollen. 3. Keep windows closed at night.  Keep car windows closed while driving. 4. Minimize morning activities outdoors, a time when pollen counts are usually at their highest. 5. Stay indoors as much as possible when pollen counts or humidity is high and on windy days when pollen tends to remain in the air longer. 6. Use air conditioning when possible.  Many air conditioners have filters that trap the pollen spores. 7. Use a HEPA room air filter to remove pollen form the indoor air you breathe.   Control of Mold Allergen  Mold and fungi can grow on a variety of surfaces provided certain temperature and moisture conditions exist.  Outdoor molds grow on plants, decaying vegetation and soil.  The major outdoor mold, Alternaria and Cladosporium, are found in very high numbers during hot and dry conditions.  Generally, a late Summer - Fall peak is seen for common outdoor fungal spores.  Rain will temporarily lower outdoor mold spore count, but counts rise rapidly when the rainy period ends.  The most important indoor molds are Aspergillus and Penicillium.  Dark, humid and poorly ventilated basements are ideal sites for mold growth.  The next most common sites of mold growth are the bathroom and the kitchen.  Outdoor [employer] 1. Use air conditioning and keep windows closed 2. Avoid exposure to  decaying vegetation. 3. Avoid leaf raking. 4. Avoid grain handling. 5. Consider wearing a face mask if working in moldy areas.  Indoor Mold Control 1. Maintain humidity below 50%. 2. Clean washable surfaces with 5% bleach solution. 3. Remove sources e.g. Contaminated carpets.  Control of House Dust Mite Allergen  House dust mites play a major role in allergic asthma and rhinitis.  They occur in environments with high humidity wherever human skin, the food for dust mites is found. High levels have been detected in dust obtained from mattresses, pillows, carpets, upholstered furniture, bed covers, clothes and soft toys.  The principal allergen of the house dust mite is found in its feces.  A gram of dust may contain 1,000 mites and 250,000 fecal particles.  Mite antigen is easily measured in the air during house cleaning activities.    1. Encase mattresses, including the box spring, and pillow, in an air tight cover.  Seal the zipper end of the encased mattresses with wide adhesive tape. 2. Wash the bedding in water of 130 degrees Farenheit weekly.  Avoid cotton comforters/quilts and flannel bedding: the most ideal bed covering is the dacron comforter. 3. Remove all upholstered furniture from the bedroom. 4. Remove carpets, carpet padding, rugs, and non-washable window drapes from the bedroom.  Wash drapes weekly or use plastic window coverings. 5. Remove all non-washable stuffed toys from the bedroom.  Wash stuffed toys weekly. 6. Have the room cleaned frequently with a vacuum cleaner and a damp dust-mop.  The patient should not be in a room which is being cleaned and should wait 1 hour after cleaning before going into the room. 7. Close and seal all heating outlets in the bedroom.  Otherwise, the room will become filled with dust-laden air.  An electric heater can be used to heat the room. 8. Reduce indoor humidity to less than 50%.  Do not use a humidifier.

## 2016-01-05 NOTE — Assessment & Plan Note (Signed)
The patient's history suggests allergic reaction with an unclear trigger. Food allergen skin tests were negative today despite a positive histamine control. The negative predictive value for skin tests is excellent (greater than 95%). We will proceed with in vitro lab studies to clarify the etiology.  The following labs have been ordered: FCeRI antibody, TSH, anti-thyroglobulin antibody, thyroid peroxidase antibody, baseline serum tryptase, CBC, CMP, ESR, ANA, and serum specific IgE and galactose-alpha-1,3-galactose. An additional lab order for serum tryptase has been provided which is to be kept by the patient to be drawn in the emergency department within 4 hours of symptom onset should symptoms recur.  Should symptoms recur, the patient has been asked to keep a journal to record any foods eaten, beverages consumed, medications taken within a 6 hour period prior to the onset of symptoms, as well as record activities being performed, and environmental conditions. For any symptoms concerning for anaphylaxis, epinephrine is to be administered and 911 is to be called immediately.  A prescription has been provided for epinephrine 0.3 mg autoinjector 2 pack along with instructions for its proper administration.

## 2016-01-06 LAB — CBC WITH DIFFERENTIAL/PLATELET
Basophils Absolute: 0 cells/uL (ref 0–200)
Basophils Relative: 0 %
Eosinophils Absolute: 304 cells/uL (ref 15–500)
Eosinophils Relative: 4 %
HCT: 38.4 % (ref 35.0–45.0)
Hemoglobin: 12 g/dL (ref 11.7–15.5)
Lymphocytes Relative: 28 %
Lymphs Abs: 2128 cells/uL (ref 850–3900)
MCH: 25.9 pg — ABNORMAL LOW (ref 27.0–33.0)
MCHC: 31.3 g/dL — ABNORMAL LOW (ref 32.0–36.0)
MCV: 82.8 fL (ref 80.0–100.0)
MPV: 8.8 fL (ref 7.5–12.5)
Monocytes Absolute: 304 cells/uL (ref 200–950)
Monocytes Relative: 4 %
Neutro Abs: 4864 cells/uL (ref 1500–7800)
Neutrophils Relative %: 64 %
Platelets: 332 10*3/uL (ref 140–400)
RBC: 4.64 MIL/uL (ref 3.80–5.10)
RDW: 14.3 % (ref 11.0–15.0)
WBC: 7.6 10*3/uL (ref 3.8–10.8)

## 2016-01-06 LAB — COMPREHENSIVE METABOLIC PANEL
ALT: 18 U/L (ref 6–29)
AST: 17 U/L (ref 10–30)
Albumin: 3.9 g/dL (ref 3.6–5.1)
Alkaline Phosphatase: 36 U/L (ref 33–115)
BUN: 12 mg/dL (ref 7–25)
CO2: 25 mmol/L (ref 20–31)
Calcium: 8.7 mg/dL (ref 8.6–10.2)
Chloride: 102 mmol/L (ref 98–110)
Creat: 0.77 mg/dL (ref 0.50–1.10)
Glucose, Bld: 141 mg/dL — ABNORMAL HIGH (ref 65–99)
Potassium: 3.7 mmol/L (ref 3.5–5.3)
Sodium: 139 mmol/L (ref 135–146)
Total Bilirubin: 0.3 mg/dL (ref 0.2–1.2)
Total Protein: 6.6 g/dL (ref 6.1–8.1)

## 2016-01-07 LAB — ANA: Anti Nuclear Antibody(ANA): NEGATIVE

## 2016-01-07 LAB — TRYPTASE: Tryptase: 2.4 ug/L (ref <11)

## 2016-01-08 LAB — C4 COMPLEMENT: C4 Complement: 25 mg/dL (ref 16–47)

## 2016-01-11 LAB — ALPHA-GAL PANEL
Beef IgE: <0.1 kU/L (ref <0.35)
Class: 0
Class: 0
Class: 0
Galactose-alpha-1,3-galactose IgE*: <0.1 kU/L (ref <0.35)
Lamb/Mutton IgE: <0.1 kU/L (ref <0.35)
Pork IgE: <0.1 kU/L (ref <0.35)

## 2016-01-13 LAB — CP CHRONIC URTICARIA INDEX PANEL
Histamine Release: <16 % (ref <16)
TSH: 1.55 mIU/L
Thyroglobulin Ab: <1 IU/mL (ref <2)
Thyroperoxidase Ab SerPl-aCnc: <1 IU/mL (ref <9)

## 2016-01-14 ENCOUNTER — Telehealth: Payer: Self-pay

## 2016-01-14 NOTE — Telephone Encounter (Signed)
-----   Message from Cristal Fordalph Carter Bobbitt, MD sent at 01/14/2016 12:43 PM EST ----- Please inform the patient that no laboratory results point to an underlying etiology. Should significant or new symptoms occur, a journal is to be kept recording any foods eaten, beverages consumed, medications taken, activities performed, and environmental conditions within a 6 hour time period prior to the onset of symptoms. For any symptoms concerning for anaphylaxis, epinephrine is to be administered and 911 is to be called immediately. Please send lab results to patient's PCP/referring physician. Thanks.

## 2016-01-14 NOTE — Telephone Encounter (Signed)
Clld pt - advsd lab results; provider's recommendations. Pt stated she understood and had no additional questions at this time.   Pt's PCP is on epic, no need to fax lab results.

## 2016-01-25 ENCOUNTER — Telehealth: Payer: Self-pay | Admitting: *Deleted

## 2016-01-25 NOTE — Telephone Encounter (Signed)
PA for Levocetirizine was denied. Please advise on next step.

## 2016-01-27 MED ORDER — CETIRIZINE HCL 10 MG PO TABS: 10.0000 mg | ORAL_TABLET | Freq: Every day | ORAL | 5 refills | Status: DC

## 2016-01-27 NOTE — Addendum Note (Signed)
Addended by: Kathrine HaddockWOOD, AMBER L on: 01/27/2016 04:52 PM   Modules accepted: Orders

## 2016-01-27 NOTE — Telephone Encounter (Signed)
Made pt aware. Advised pt to buy OTC if pharmacy does not refill.

## 2016-01-27 NOTE — Telephone Encounter (Signed)
She'll have to go with cetirizine 10 mg or fexofenadine 180 mg daily as needed.

## 2016-01-27 NOTE — Telephone Encounter (Signed)
Zyrtec was sent to pharmacy.

## 2016-03-15 ENCOUNTER — Ambulatory Visit (INDEPENDENT_AMBULATORY_CARE_PROVIDER_SITE_OTHER): Payer: 59 | Admitting: Obstetrics and Gynecology

## 2016-03-15 ENCOUNTER — Encounter: Payer: Self-pay | Admitting: Obstetrics and Gynecology

## 2016-03-15 DIAGNOSIS — Z01419 Encounter for gynecological examination (general) (routine) without abnormal findings: Secondary | ICD-10-CM

## 2016-03-15 DIAGNOSIS — Z0000 Encounter for general adult medical examination without abnormal findings: Secondary | ICD-10-CM

## 2016-03-15 DIAGNOSIS — Z202 Contact with and (suspected) exposure to infections with a predominantly sexual mode of transmission: Secondary | ICD-10-CM | POA: Insufficient documentation

## 2016-03-15 NOTE — Progress Notes (Signed)
Patient is in the office for annual exam.   Dawn [name] is a 36 y.o. [phone] female here for a routine annual gynecologic exam. She has no GYN complaints. She desires STD testing. Sexual active without problems. PCP is following and managing chronic medical problems.     Gynecologic History Patient's last menstrual period was 06/25/2014 (approximate). Contraception: Hyst Last Pap: Unknown. Results were: unknown Last mammogram: NA. Results were: NA  Obstetric History OB History  Gravida Para Term Preterm AB Living  3 1 1   2 1   SAB TAB Ectopic Multiple Live Births  2       1    # Outcome Date GA Lbr Len/2nd Weight Sex Delivery Anes PTL Lv  3 SAB 2014     SAB     2 SAB 2013     SAB     1 Term 03/05/02 391w0d  7 lb (3.175 kg) F Vag-Spont EPI N LIV      Past Medical History:  Diagnosis Date  . GERD (gastroesophageal reflux disease)   . Migraine dx'd 05/25/2015  . PONV (postoperative nausea and vomiting)   . Vaginal delivery 2004    Past Surgical History:  Procedure Laterality Date  . BREAST REDUCTION SURGERY Bilateral 08/28/2014   Procedure: BILATERAL BREAST  REDUCTION  ;  Surgeon: Etter Sjogrenavid Bowers, MD;  [facility];  Service: Plastics;  Laterality: Bilateral;  . LAPAROSCOPY N/A 02/18/2013   Procedure: LAPAROSCOPY OPERATIVE;  Surgeon: Levi AlandMark E Anderson, MD;  Location: [facility] ORS;  Service: Gynecology;  Laterality: N/A;  YAG LASER  . REDUCTION MAMMAPLASTY  08/28/2014  . TONSILLECTOMY  2008  . VAGINAL HYSTERECTOMY  06-26-14  . YAG LASER APPLICATION N/A 02/18/2013   Procedure: YAG LASER APPLICATION;  Surgeon: Levi AlandMark E Anderson, MD;  Location: [facility] ORS;  Service: Gynecology;  Laterality: N/A;    Current Outpatient Prescriptions on File Prior to Visit  Medication Sig Dispense Refill  . cetirizine (ZYRTEC) 10 MG tablet Take 1 tablet (10 mg total) by mouth daily. (Patient not taking: Reported on 03/15/2016) 30 tablet 5  . cholecalciferol (VITAMIN D) 1000 units tablet Take 1,000 Units by mouth  daily.    . Cyanocobalamin (VITAMIN B-12 PO) Take 1 tablet by mouth daily.    . ferrous sulfate 325 (65 FE) MG tablet Take 325 mg by mouth daily with breakfast.    . levocetirizine (XYZAL) 5 MG tablet Take 1 tablet (5 mg total) by mouth as needed for allergies. (Patient not taking: Reported on 03/15/2016) 30 tablet 5  . Multiple Vitamin (MULTIVITAMIN WITH MINERALS) TABS tablet Take 1 tablet by mouth daily.    . psyllium (HYDROCIL/METAMUCIL) 95 % PACK Take 1 packet by mouth daily.    Marland Kitchen. topiramate (TOPAMAX) 50 MG tablet Take 1 tablet (50 mg total) by mouth 2 (two) times daily. (Patient not taking: Reported on 01/05/2016) 60 tablet 0  . traMADol (ULTRAM) 50 MG tablet Take 2 tablets (100 mg total) by mouth every 6 (six) hours as needed for moderate pain or severe pain. (Patient not taking: Reported on 01/05/2016) 30 tablet 0  . valACYclovir (VALTREX) 500 MG tablet Take 500 mg by mouth as needed.    . [DISCONTINUED] diphenhydrAMINE (BENADRYL) 25 MG tablet Take 1 tablet (25 mg total) by mouth every 8 (eight) hours as needed for itching. 9 tablet 0   No current facility-administered medications on file prior to visit.     Allergies  Allergen Reactions  . Claritin [Loratadine] Other (See Comments)  Tremors    Social History   Social History  . Marital status: Single    Spouse name: N/A  . Number of children: 1  . Years of education: 21   Occupational History  . PROVIDER [employer]   Social History Main Topics  . Smoking status: Never Smoker  . Smokeless tobacco: Never Used  . Alcohol use No  . Drug use: No  . Sexual activity: Yes   Other Topics Concern  . Not on file   Social History Narrative  . No narrative on file    Family History  Problem Relation Age of Onset  . Hyperlipidemia Mother   . Hypertension Mother   . Allergic rhinitis Mother   . Asthma Sister   . Allergic rhinitis Sister   . Allergic rhinitis Brother   . Asthma Paternal Uncle     The  following portions of the patient's history were reviewed and updated as appropriate: allergies, current medications, past family history, past medical history, past social history, past surgical history and problem list.  Review of Systems A comprehensive review of systems was negative.   Objective:  BP 120/83   Pulse 78   Temp 98.6 F (37 C) (Oral)   Ht 5\' 6"  (1.676 m)   Wt 247 lb 4.8 oz (112.2 kg)   LMP 06/25/2014 (Approximate)   BMI 39.92 kg/m  CONSTITUTIONAL: Well-developed, well-nourished female in no acute distress.  HENT:  Normocephalic, atraumatic, External right and left ear normal. Oropharynx is clear and moist EYES: Conjunctivae and EOM are normal. Pupils are equal, round, and reactive to light. No scleral icterus.  NECK: Normal range of motion, supple, no masses.  Normal thyroid.  SKIN: Skin is warm and dry. No rash noted. Not diaphoretic. No erythema. No pallor. NEUROLGIC: Alert and oriented to person, place, and time. Normal reflexes, muscle tone coordination. No cranial nerve deficit noted. PSYCHIATRIC: Normal mood and affect. Normal behavior. Normal judgment and thought content. CARDIOVASCULAR: Normal heart rate noted, regular rhythm RESPIRATORY: Clear to auscultation bilaterally. Effort and breath sounds normal, no problems with respiration noted. BREASTS: Symmetric in size. No masses, skin changes, nipple drainage, or lymphadenopathy. ABDOMEN: Soft, normal bowel sounds, no distention noted.  No tenderness, rebound or guarding.  PELVIC: Normal appearing external genitalia; normal appearing vaginal mucosa and cervix.  No abnormal discharge noted.  Pap smear obtained.  Normal uterine size, no other palpable masses, no uterine or adnexal tenderness. MUSCULOSKELETAL: Normal range of motion. No tenderness.  No cyanosis, clubbing, or edema.  2+ distal pulses.   Assessment:  Annual gynecologic examination with pap smear   Plan:  Will follow up results of pap smear and  manage accordingly. STD testing at pt's request Routine preventative health maintenance measures emphasized. Please refer to After Visit Summary for other counseling recommendations.    Hermina Staggers, MD, FACOG Attending Obstetrician & Gynecologist Center for [facility], [facility]

## 2016-03-16 LAB — HEPATITIS B SURFACE ANTIGEN: Hepatitis B Surface Ag: NEGATIVE

## 2016-03-16 LAB — RPR: RPR: NONREACTIVE

## 2016-03-16 LAB — HIV ANTIBODY (ROUTINE TESTING W REFLEX): HIV Screen 4th Generation wRfx: NONREACTIVE

## 2016-03-18 LAB — CYTOLOGY - PAP
Chlamydia: NEGATIVE
Diagnosis: NEGATIVE
HPV: DETECTED — AB
NEISSERIA GONORRHEA: NEGATIVE

## 2016-04-11 ENCOUNTER — Emergency Department (HOSPITAL_COMMUNITY)
Admission: EM | Admit: 2016-04-11 | Discharge: 2016-04-11 | Disposition: A | Discharge status: Home / Self Care | Payer: 59 | Attending: Emergency Medicine | Admitting: Emergency Medicine

## 2016-04-11 ENCOUNTER — Encounter (HOSPITAL_COMMUNITY): Payer: Self-pay | Admitting: Nurse Practitioner

## 2016-04-11 DIAGNOSIS — R202 Paresthesia of skin: Secondary | ICD-10-CM | POA: Diagnosis not present

## 2016-04-11 DIAGNOSIS — T781XXA Other adverse food reactions, not elsewhere classified, initial encounter: Secondary | ICD-10-CM | POA: Insufficient documentation

## 2016-04-11 DIAGNOSIS — T7840XA Allergy, unspecified, initial encounter: Secondary | ICD-10-CM | POA: Diagnosis present

## 2016-04-11 DIAGNOSIS — Z79899 Other long term (current) drug therapy: Secondary | ICD-10-CM | POA: Insufficient documentation

## 2016-04-11 DIAGNOSIS — I10 Essential (primary) hypertension: Secondary | ICD-10-CM | POA: Diagnosis not present

## 2016-04-11 LAB — POC URINE PREG, ED: PREG TEST UR: NEGATIVE

## 2016-04-11 MED ORDER — DIPHENHYDRAMINE HCL 50 MG/ML IJ SOLN
25.0000 mg | Freq: Once | INTRAMUSCULAR | Status: AC
  Administered 2016-04-11: 25 mg via INTRAVENOUS
  Filled 2016-04-11: qty 1

## 2016-04-11 MED ORDER — METHYLPREDNISOLONE SODIUM SUCC 125 MG IJ SOLR
125.0000 mg | Freq: Once | INTRAMUSCULAR | Status: AC
Start: 2016-04-11 — End: 2016-04-11
  Administered 2016-04-11: 125 mg via INTRAVENOUS
  Filled 2016-04-11: qty 2

## 2016-04-11 MED ORDER — METHYLPREDNISOLONE SODIUM SUCC 40 MG IJ SOLR: 40.0000 mg | Freq: Once | INTRAMUSCULAR | Status: DC

## 2016-04-11 MED ORDER — ONDANSETRON HCL 4 MG/2ML IJ SOLN
4.0000 mg | Freq: Once | INTRAMUSCULAR | Status: AC
  Administered 2016-04-11: 4 mg via INTRAVENOUS
  Filled 2016-04-11: qty 2

## 2016-04-11 MED ORDER — IBUPROFEN 400 MG PO TABS
600.0000 mg | ORAL_TABLET | Freq: Once | ORAL | Status: AC
  Administered 2016-04-11: 600 mg via ORAL
  Filled 2016-04-11: qty 1

## 2016-04-11 MED ORDER — FAMOTIDINE 20 MG PO TABS
20.0000 mg | ORAL_TABLET | Freq: Once | ORAL | Status: AC
  Administered 2016-04-11: 20 mg via ORAL
  Filled 2016-04-11: qty 1

## 2016-04-11 NOTE — Discharge Instructions (Signed)
[name], I'm glad your breathing and facial swelling are feeling better. We treated you with steroids and benadryl for an allergic reaction. We think the allergic reaction may have been related to something that you ate today. Avoid eating skittles, this reaction could be much worse if you come in you come in contact with the same thing that caused this allergic reaction again. If you have this sudden onset of difficulty breathing again, use your epi pen and seek medical attention immediately.

## 2016-04-11 NOTE — ED Triage Notes (Signed)
Pt presents with c/o allergic reaction. The reaction began after she was eating skittles, which she has eaten in the past with no allergic reaction. She c/o tingling in her lips, lip and throat swelling, itchy face, nausea. she has an epi pen but is afraid to use it.

## 2016-04-11 NOTE — ED Provider Notes (Signed)
[facility]-EMERGENCY DEPT Provider Note   CSN: [id number] Arrival date & time: 04/11/16  1811     History   Chief Complaint Chief Complaint  Patient presents with  . Allergic Reaction    HPI Lamar SprinklesBetty Fenoglio is a 36 y.o. female.  36 year old woman with history of easy normal and food allergies presents with tongue tingling and difficulty breathing. 3:30 this afternoon she ate a backup schedules at 4 PM she developed tingling at the tip of her tongue which then spread to the back of her tongue in her lips. Now she has a burning sensation in her mouth with breathing and is having a hard time breathing. Her face is itchy and her eyes are puffy. She has also had epigastric abdominal pain and nausea. During this history she mentioned that a sharp central chest pain developed in the center of her chest that is worse with deep inspiration. She has had an allergic reaction to nuts in the past at that time she had difficulty breathing but has never required intubation. Besides her allergies she is otherwise healthy and does not take any medications regularly.       Past Medical History:  Diagnosis Date  . GERD (gastroesophageal reflux disease)   . Migraine dx'd 05/25/2015  . PONV (postoperative nausea and vomiting)   . Vaginal delivery 2004    Patient Active Problem List   Diagnosis Date Noted  . Well woman exam 03/15/2016  . Possible exposure to STD 03/15/2016  . Allergic reactions 01/05/2016  . Seasonal and perennial allergic rhinitis 01/05/2016  . Nasal polyps 01/05/2016  . Stroke-like symptoms 05/25/2015  . Cephalalgia   . Essential hypertension   . Breast hypertrophy in female 08/28/2014  . Obesity 04/03/2014  . Back pain 04/03/2014  . Chronic sinusitis 04/03/2014  . Endometriosis determined by laparoscopy 04/03/2014  . Recurrent headache 11/08/2012  . Genital herpes 10/25/2011  . Avitaminosis D 09/05/2011  . Acid reflux 08/05/2011  . Adult BMI 30+ 08/05/2011    Past Surgical  History:  Procedure Laterality Date  . BREAST REDUCTION SURGERY Bilateral 08/28/2014   Procedure: BILATERAL BREAST  REDUCTION  ;  Surgeon: Etter Sjogrenavid Bowers, MD;  [facility];  Service: Plastics;  Laterality: Bilateral;  . LAPAROSCOPY N/A 02/18/2013   Procedure: LAPAROSCOPY OPERATIVE;  Surgeon: Levi AlandMark E Anderson, MD;  Location: [facility] ORS;  Service: Gynecology;  Laterality: N/A;  YAG LASER  . REDUCTION MAMMAPLASTY  08/28/2014  . TONSILLECTOMY  2008  . VAGINAL HYSTERECTOMY  06-26-14  . YAG LASER APPLICATION N/A 02/18/2013   Procedure: YAG LASER APPLICATION;  Surgeon: Levi AlandMark E Anderson, MD;  Location: [facility] ORS;  Service: Gynecology;  Laterality: N/A;    OB History    Gravida Para Term Preterm AB Living   3 1 1   2 1    SAB TAB Ectopic Multiple Live Births   2       1       Home Medications    Prior to Admission medications   Medication Sig Start Date End Date Taking? Authorizing Provider  acetaminophen (TYLENOL) 500 MG tablet Take 1,000 mg by mouth every 6 (six) hours as needed.   Yes Historical Provider, MD  cholecalciferol (VITAMIN D) 1000 units tablet Take 1,000 Units by mouth daily.   Yes Historical Provider, MD  Cyanocobalamin (VITAMIN B-12 PO) Take 1 tablet by mouth daily.   Yes Historical Provider, MD  EPINEPHrine 0.3 mg/0.3 mL IJ SOAJ injection Inject 0.3 mg into the muscle once. 01/05/16  Yes Historical Provider, MD  fexofenadine (ALLEGRA) 180 MG tablet Take 180 mg by mouth daily.   Yes Historical Provider, MD  Multiple Vitamin (MULTIVITAMIN WITH MINERALS) TABS tablet Take 1 tablet by mouth daily.   Yes Historical Provider, MD  psyllium (HYDROCIL/METAMUCIL) 95 % PACK Take 1 packet by mouth daily.   Yes Historical Provider, MD  valACYclovir (VALTREX) 500 MG tablet Take 500 mg by mouth as needed. 07/29/15  Yes Historical Provider, MD  traMADol (ULTRAM) 50 MG tablet Take 2 tablets (100 mg total) by mouth every 6 (six) hours as needed for moderate pain or severe pain. Patient not taking: Reported  on 01/05/2016 05/27/15   Kathlen Mody, MD    Family History Family History  Problem Relation Age of Onset  . Hyperlipidemia Mother   . Hypertension Mother   . Allergic rhinitis Mother   . Asthma Sister   . Allergic rhinitis Sister   . Allergic rhinitis Brother   . Asthma Paternal Uncle     Social History Social History  Substance Use Topics  . Smoking status: Never Smoker  . Smokeless tobacco: Never Used  . Alcohol use No     Allergies   Claritin [loratadine]   Review of Systems Review of Systems  HENT: Positive for facial swelling.        Tingling of her tongue and lips   Respiratory:       Difficulty breathing   Gastrointestinal: Positive for abdominal pain and nausea.  Neurological: Positive for headaches.  All other systems reviewed and are negative.    Physical Exam Updated Vital Signs BP 122/78   Pulse 95   Temp 97.6 F (36.4 C) (Oral)   Resp 17   LMP 06/25/2014 (Approximate)   SpO2 98%   Physical Exam  Constitutional: She is oriented to person, place, and time. She appears well-developed and well-nourished. No distress.  HENT:  Head: Normocephalic and atraumatic.  Mouth/Throat: Oropharynx is clear and moist. No oropharyngeal exudate.  Mild facial swelling  Eyes: Conjunctivae are normal. No scleral icterus.  Neck: Normal range of motion.  Cardiovascular: Normal rate and regular rhythm.   No murmur heard. Pulmonary/Chest: Effort normal. No respiratory distress. She has no wheezes. She has no rales. She exhibits no tenderness.  Speaking in full sentences   Abdominal: Soft. She exhibits no distension. There is no tenderness. There is no guarding.  Neurological: She is alert and oriented to person, place, and time.  Skin: Skin is warm and dry. She is not diaphoretic.  Psychiatric: She has a normal mood and affect. Her behavior is normal.    ED Treatments / Results  Labs (all labs ordered are listed, but only abnormal results are displayed) Labs  Reviewed - No data to display  EKG  EKG Interpretation None       Radiology No results found.  Procedures Procedures (including critical care time)  Medications Ordered in ED Medications - No data to display   Initial Impression / Assessment and Plan / ED Course  I have reviewed the triage vital signs and the nursing notes.  Pertinent labs & imaging results that were available during my care of the patient were reviewed by me and considered in my medical decision making (see chart for details).   36 year old woman with PMH seasonal and food allergies presents with tongue and lip tingling, facial itch, difficulty breathing and nausea after eating skittles. On exam she is tachycardic, SpO2 98% on room air, she is speaking  in full sentences and without wheeze. Presentation is consistent with allergic reaction. Ordered IV solu-medrol, benadryl, pepcid, ibuprofen, and zofran.   8:20 Pt feels that her breathing and facial swelling have improved. She feels ready to go home.   Pt has ap epi pen at home. I have updated her on how to use her epi pen and discussed return precautions. Discussed avoiding foods that she is allergic to. Advised close follow up with primary care provider.   Final Clinical Impressions(s) / ED Diagnoses   Final diagnoses:  None    New Prescriptions New Prescriptions   No medications on file     Eulah Pont, MD 04/11/16 2038    Laurence Spates, MD 04/16/16 [phone]

## 2016-04-11 NOTE — ED Notes (Signed)
Pt departed in NAD, refused use of wheelchair.  

## 2016-05-17 ENCOUNTER — Ambulatory Visit: Payer: 59

## 2016-05-17 DIAGNOSIS — N898 Other specified noninflammatory disorders of vagina: Secondary | ICD-10-CM

## 2016-05-17 NOTE — Progress Notes (Signed)
Patient is in the office for vaginal itching and discharge. Pt states that she does not want to self swab and will follow up with her primary care provider.

## 2016-07-24 ENCOUNTER — Emergency Department (HOSPITAL_COMMUNITY): Payer: 59

## 2016-07-24 ENCOUNTER — Encounter (HOSPITAL_COMMUNITY): Payer: Self-pay

## 2016-07-24 ENCOUNTER — Emergency Department (HOSPITAL_COMMUNITY)
Admission: EM | Admit: 2016-07-24 | Discharge: 2016-07-24 | Disposition: A | Discharge status: Home / Self Care | Payer: 59 | Attending: Emergency Medicine | Admitting: Emergency Medicine

## 2016-07-24 DIAGNOSIS — T7840XA Allergy, unspecified, initial encounter: Secondary | ICD-10-CM | POA: Diagnosis present

## 2016-07-24 DIAGNOSIS — Z79899 Other long term (current) drug therapy: Secondary | ICD-10-CM | POA: Insufficient documentation

## 2016-07-24 DIAGNOSIS — I10 Essential (primary) hypertension: Secondary | ICD-10-CM | POA: Diagnosis not present

## 2016-07-24 LAB — BASIC METABOLIC PANEL WITH GFR
Anion gap: 10 (ref 5–15)
BUN: 9 mg/dL (ref 6–20)
CO2: 25 mmol/L (ref 22–32)
Calcium: 8.4 mg/dL — ABNORMAL LOW (ref 8.9–10.3)
Chloride: 101 mmol/L (ref 101–111)
Creatinine, Ser: 0.83 mg/dL (ref 0.44–1.00)
GFR calc Af Amer: >60 mL/min
GFR calc non Af Amer: >60 mL/min
Glucose, Bld: 88 mg/dL (ref 65–99)
Potassium: 3.8 mmol/L (ref 3.5–5.1)
Sodium: 136 mmol/L (ref 135–145)

## 2016-07-24 LAB — CBC WITH DIFFERENTIAL/PLATELET
BASOS ABS: 0 10*3/uL (ref 0.0–0.1)
Basophils Relative: 0 %
EOS ABS: 0.1 10*3/uL (ref 0.0–0.7)
Eosinophils Relative: 1 %
HCT: 39.2 % (ref 36.0–46.0)
HEMOGLOBIN: 12.5 g/dL (ref 12.0–15.0)
LYMPHS ABS: 3 10*3/uL (ref 0.7–4.0)
LYMPHS PCT: 29 %
MCH: 25.7 pg — AB (ref 26.0–34.0)
MCHC: 31.9 g/dL (ref 30.0–36.0)
MCV: 80.5 fL (ref 78.0–100.0)
Monocytes Absolute: 0.5 10*3/uL (ref 0.1–1.0)
Monocytes Relative: 5 %
NEUTROS PCT: 65 %
Neutro Abs: 6.7 10*3/uL (ref 1.7–7.7)
Platelets: 320 10*3/uL (ref 150–400)
RBC: 4.87 MIL/uL (ref 3.87–5.11)
RDW: 13.8 % (ref 11.5–15.5)
WBC: 10.2 10*3/uL (ref 4.0–10.5)

## 2016-07-24 LAB — I-STAT TROPONIN, ED: Troponin i, poc: 0.01 ng/mL (ref 0.00–0.08)

## 2016-07-24 MED ORDER — DIPHENHYDRAMINE HCL 50 MG/ML IJ SOLN
25.0000 mg | Freq: Once | INTRAMUSCULAR | Status: AC
  Administered 2016-07-24: 25 mg via INTRAVENOUS
  Filled 2016-07-24: qty 1

## 2016-07-24 MED ORDER — METHYLPREDNISOLONE SODIUM SUCC 125 MG IJ SOLR
125.0000 mg | Freq: Once | INTRAMUSCULAR | Status: AC
  Administered 2016-07-24: 125 mg via INTRAVENOUS
  Filled 2016-07-24: qty 2

## 2016-07-24 NOTE — Discharge Instructions (Signed)
Take Benadryl 25 mg every 4-6 hours if needed for itching or swelling. Follow-up with your doctor if any problems

## 2016-07-24 NOTE — ED Triage Notes (Signed)
Onset 12noon pt hugged someone at church and started feeling face burning.  Right side of face swollen and throat swollen, pt used epi pen @ 1pm.  Pt is sensitive to some makeups.  NAD at triage.

## 2016-07-24 NOTE — ED Notes (Addendum)
Patient reports that she used Epi pen and also took Benadryl 50 mg approx 1330.

## 2016-07-24 NOTE — ED Provider Notes (Signed)
[facility]-EMERGENCY DEPT Provider Note   CSN: [id number] Arrival date & time: 07/24/16  1407     History   Chief Complaint Chief Complaint  Patient presents with  . Allergic Reaction    HPI Dawn [name] is a 36 y.o. female.  Patient states that she has a history of allergies to perfume. She states that she poked somebody in her face touch them and then shortly afterwards she started with itching in her throat and swelling to the right side of her face. She took some epinephrine. Patient states she is some better now   The history is provided by the patient.  Allergic Reaction  Presenting symptoms: itching   Presenting symptoms: no rash   Severity:  Moderate Prior episodes: Perfume. Context: not animal exposure   Relieved by:  Antihistamines Worsened by:  Nothing Ineffective treatments:  Antihistamines and epinephrine   Past Medical History:  Diagnosis Date  . GERD (gastroesophageal reflux disease)   . Migraine dx'd 05/25/2015  . PONV (postoperative nausea and vomiting)   . Vaginal delivery 2004    Patient Active Problem List   Diagnosis Date Noted  . Well woman exam 03/15/2016  . Possible exposure to STD 03/15/2016  . Allergic reactions 01/05/2016  . Seasonal and perennial allergic rhinitis 01/05/2016  . Nasal polyps 01/05/2016  . Stroke-like symptoms 05/25/2015  . Cephalalgia   . Essential hypertension   . Breast hypertrophy in female 08/28/2014  . Obesity 04/03/2014  . Back pain 04/03/2014  . Chronic sinusitis 04/03/2014  . Endometriosis determined by laparoscopy 04/03/2014  . Recurrent headache 11/08/2012  . Genital herpes 10/25/2011  . Avitaminosis D 09/05/2011  . Acid reflux 08/05/2011  . Adult BMI 30+ 08/05/2011    Past Surgical History:  Procedure Laterality Date  . BREAST REDUCTION SURGERY Bilateral 08/28/2014   Procedure: BILATERAL BREAST  REDUCTION  ;  Surgeon: Etter Sjogren, MD;  Location: [facility] OR;  Service: Plastics;  Laterality: Bilateral;  .  LAPAROSCOPY N/A 02/18/2013   Procedure: LAPAROSCOPY OPERATIVE;  Surgeon: Levi Aland, MD;  Location: [facility] ORS;  Service: Gynecology;  Laterality: N/A;  YAG LASER  . REDUCTION MAMMAPLASTY  08/28/2014  . TONSILLECTOMY  2008  . VAGINAL HYSTERECTOMY  06-26-14  . YAG LASER APPLICATION N/A 02/18/2013   Procedure: YAG LASER APPLICATION;  Surgeon: Levi Aland, MD;  Location: [facility] ORS;  Service: Gynecology;  Laterality: N/A;    OB History    Gravida Para Term Preterm AB Living   3 1 1   2 1    SAB TAB Ectopic Multiple Live Births   2       1       Home Medications    Prior to Admission medications   Medication Sig Start Date End Date Taking? Authorizing Provider  acetaminophen (TYLENOL) 500 MG tablet Take 1,000 mg by mouth every 6 (six) hours as needed for headache.    Yes [provider]  cholecalciferol (VITAMIN D) 1000 units tablet Take 1,000 Units by mouth daily.   Yes [provider]  Cyanocobalamin (VITAMIN B-12 PO) Take 1 tablet by mouth daily.   Yes [provider]  diphenhydrAMINE (BENADRYL) 25 MG tablet Take 50 mg by mouth once as needed (severe allergic reaction).   Yes [provider]  EPINEPHrine 0.3 mg/0.3 mL IJ SOAJ injection Inject 0.3 mg into the muscle once as needed (severe allergic reaction).  01/05/16  Yes [provider]  Multiple Vitamin (MULTIVITAMIN WITH MINERALS) TABS tablet Take 1 tablet  by mouth daily.   Yes [provider]  valACYclovir (VALTREX) 500 MG tablet Take 1,000 mg by mouth See admin instructions. Take 2 tablet (1000 mg) by mouth daily for 3 days as needed for outbreaks 07/29/15  Yes [provider]  traMADol (ULTRAM) 50 MG tablet Take 2 tablets (100 mg total) by mouth every 6 (six) hours as needed for moderate pain or severe pain. Patient not taking: Reported on 01/05/2016 05/27/15   Kathlen Mody, MD    Family History Family History  Problem Relation Age of Onset  . Hyperlipidemia Mother     . Hypertension Mother   . Allergic rhinitis Mother   . Asthma Sister   . Allergic rhinitis Sister   . Allergic rhinitis Brother   . Asthma Paternal Uncle     Social History Social History  Substance Use Topics  . Smoking status: Never Smoker  . Smokeless tobacco: Never Used  . Alcohol use No     Allergies   Claritin [loratadine] and Other   Review of Systems Review of Systems  Constitutional: Negative for appetite change and fatigue.  HENT: Negative for congestion, ear discharge and sinus pressure.        Swelling to face and itching and throat  Eyes: Negative for discharge.  Respiratory: Negative for cough.   Cardiovascular: Negative for chest pain.  Gastrointestinal: Negative for abdominal pain and diarrhea.  Genitourinary: Negative for frequency and hematuria.  Musculoskeletal: Negative for back pain.  Skin: Positive for itching. Negative for rash.  Neurological: Negative for seizures and headaches.  Psychiatric/Behavioral: Negative for hallucinations.     Physical Exam Updated Vital Signs BP (!) 135/97   Pulse 86   Temp 98.3 F (36.8 C) (Oral)   Resp (!) 23   LMP 06/25/2014 (Approximate)   SpO2 95%   Physical Exam  Constitutional: She is oriented to person, place, and time. She appears well-developed.  HENT:  Head: Normocephalic.  Pharynx normal  Eyes: Conjunctivae and EOM are normal. No scleral icterus.  Neck: Neck supple. No thyromegaly present.  Cardiovascular: Normal rate and regular rhythm.  Exam reveals no gallop and no friction rub.   No murmur heard. Pulmonary/Chest: No stridor. She has no wheezes. She has no rales. She exhibits no tenderness.  Abdominal: She exhibits no distension. There is no tenderness. There is no rebound.  Musculoskeletal: Normal range of motion. She exhibits no edema.  Lymphadenopathy:    She has no cervical adenopathy.  Neurological: She is oriented to person, place, and time. She exhibits normal muscle tone.  Coordination normal.  Skin: No rash noted. No erythema.  Psychiatric: She has a normal mood and affect. Her behavior is normal.     ED Treatments / Results  Labs (all labs ordered are listed, but only abnormal results are displayed) Labs Reviewed  CBC WITH DIFFERENTIAL/PLATELET - Abnormal; Notable for the following:       Result Value   MCH 25.7 (*)    All other components within normal limits  BASIC METABOLIC PANEL - Abnormal; Notable for the following:    Calcium 8.4 (*)    All other components within normal limits  I-STAT TROPOININ, ED    EKG  EKG Interpretation  Date/Time:  Sunday July 24 2016 17:13:15 EDT Ventricular Rate:  92 PR Interval:    QRS Duration: 73 QT Interval:  369 QTC Calculation: 457 R Axis:   60 Text Interpretation:  Sinus rhythm Low voltage, precordial leads Confirmed by Christipher Rieger  MD, Alec Mcphee (  [id number]) on 07/24/2016 7:06:08 PM       Radiology Dg Chest 2 View  Result Date: 07/24/2016 CLINICAL DATA:  Chest pain EXAM: CHEST  2 VIEW COMPARISON:  05/25/2015 chest radiograph. FINDINGS: Low lung volumes. Stable cardiomediastinal silhouette with normal heart size. No pneumothorax. No pleural effusion. Lungs appear clear, with no acute consolidative airspace disease and no pulmonary edema. IMPRESSION: No active cardiopulmonary disease. Electronically Signed   By: Delbert PhenixJason A Poff M.D.   On: 07/24/2016 15:46    Procedures Procedures (including critical care time)  Medications Ordered in ED Medications  diphenhydrAMINE (BENADRYL) injection 25 mg (25 mg Intravenous Given 07/24/16 1714)  methylPREDNISolone sodium succinate (SOLU-MEDROL) 125 mg/2 mL injection 125 mg (125 mg Intravenous Given 07/24/16 1715)     Initial Impression / Assessment and Plan / ED Course  I have reviewed the triage vital signs and the nursing notes.  Pertinent labs & imaging results that were available during my care of the patient were reviewed by me and considered in my medical decision  making (see chart for details).     Patient most likely had a mild allergic reaction. She will take Benadryl as needed follow-up with her doctor if needed  Final Clinical Impressions(s) / ED Diagnoses   Final diagnoses:  Allergic reaction, initial encounter    New Prescriptions New Prescriptions   No medications on file     Bethann BerkshireZammit, Lurdes Haltiwanger, MD 07/24/16 1911

## 2016-08-16 ENCOUNTER — Emergency Department (HOSPITAL_COMMUNITY)
Admission: EM | Admit: 2016-08-16 | Discharge: 2016-08-16 | Disposition: A | Discharge status: Home / Self Care | Payer: 59 | Attending: Emergency Medicine | Admitting: Emergency Medicine

## 2016-08-16 ENCOUNTER — Encounter (HOSPITAL_COMMUNITY): Payer: Self-pay | Admitting: Emergency Medicine

## 2016-08-16 ENCOUNTER — Emergency Department (HOSPITAL_COMMUNITY): Payer: 59

## 2016-08-16 DIAGNOSIS — L299 Pruritus, unspecified: Secondary | ICD-10-CM | POA: Diagnosis not present

## 2016-08-16 DIAGNOSIS — R079 Chest pain, unspecified: Secondary | ICD-10-CM | POA: Insufficient documentation

## 2016-08-16 LAB — CBC
HCT: 40 % (ref 36.0–46.0)
HEMOGLOBIN: 12.8 g/dL (ref 12.0–15.0)
MCH: 26.1 pg (ref 26.0–34.0)
MCHC: 32 g/dL (ref 30.0–36.0)
MCV: 81.6 fL (ref 78.0–100.0)
PLATELETS: 313 10*3/uL (ref 150–400)
RBC: 4.9 MIL/uL (ref 3.87–5.11)
RDW: 13.4 % (ref 11.5–15.5)
WBC: 8.8 10*3/uL (ref 4.0–10.5)

## 2016-08-16 LAB — BASIC METABOLIC PANEL
Anion gap: 8 (ref 5–15)
BUN: 10 mg/dL (ref 6–20)
CALCIUM: 8.9 mg/dL (ref 8.9–10.3)
CHLORIDE: 105 mmol/L (ref 101–111)
CO2: 25 mmol/L (ref 22–32)
CREATININE: 0.83 mg/dL (ref 0.44–1.00)
GFR calc Af Amer: >60 mL/min (ref >60)
GFR calc non Af Amer: >60 mL/min (ref >60)
GLUCOSE: 99 mg/dL (ref 65–99)
Potassium: 3.9 mmol/L (ref 3.5–5.1)
Sodium: 138 mmol/L (ref 135–145)

## 2016-08-16 LAB — I-STAT TROPONIN, ED: TROPONIN I, POC: 0 ng/mL (ref 0.00–0.08)

## 2016-08-16 NOTE — ED Triage Notes (Signed)
Pt states she ate [employer] pizza today between 4-5pm, states she began feeling itchy, and developed chest pain. Pt took 2 benadryl, denies shortness of breath, lung sounds clear, SpO2-99% room air. Pt states she has seen an allergist and has been told she doesn't have any food allergies.

## 2016-08-16 NOTE — ED Notes (Signed)
At the desk.  Reports I feel better and I don't want to wait.  Taken out at this time.

## 2016-12-12 ENCOUNTER — Observation Stay (HOSPITAL_COMMUNITY)
Admission: EM | Admit: 2016-12-12 | Discharge: 2016-12-14 | Disposition: A | Discharge status: Home / Self Care | Payer: 59 | Attending: General Surgery | Admitting: General Surgery

## 2016-12-12 ENCOUNTER — Other Ambulatory Visit: Payer: Self-pay

## 2016-12-12 ENCOUNTER — Encounter (HOSPITAL_COMMUNITY): Payer: Self-pay

## 2016-12-12 DIAGNOSIS — Z79899 Other long term (current) drug therapy: Secondary | ICD-10-CM | POA: Insufficient documentation

## 2016-12-12 DIAGNOSIS — K3580 Unspecified acute appendicitis: Principal | ICD-10-CM | POA: Insufficient documentation

## 2016-12-12 DIAGNOSIS — Z6841 Body Mass Index (BMI) 40.0 and over, adult: Secondary | ICD-10-CM | POA: Insufficient documentation

## 2016-12-12 DIAGNOSIS — R109 Unspecified abdominal pain: Secondary | ICD-10-CM | POA: Diagnosis present

## 2016-12-12 DIAGNOSIS — K5909 Other constipation: Secondary | ICD-10-CM | POA: Insufficient documentation

## 2016-12-12 DIAGNOSIS — K219 Gastro-esophageal reflux disease without esophagitis: Secondary | ICD-10-CM | POA: Diagnosis not present

## 2016-12-12 LAB — COMPREHENSIVE METABOLIC PANEL
ALBUMIN: 3.8 g/dL (ref 3.5–5.0)
ALK PHOS: 42 U/L (ref 38–126)
ALT: 24 U/L (ref 14–54)
AST: 18 U/L (ref 15–41)
Anion gap: 11 (ref 5–15)
BILIRUBIN TOTAL: 0.5 mg/dL (ref 0.3–1.2)
BUN: 9 mg/dL (ref 6–20)
CALCIUM: 9.1 mg/dL (ref 8.9–10.3)
CO2: 25 mmol/L (ref 22–32)
Chloride: 101 mmol/L (ref 101–111)
Creatinine, Ser: 0.78 mg/dL (ref 0.44–1.00)
GFR calc Af Amer: >60 mL/min (ref >60)
GLUCOSE: 82 mg/dL (ref 65–99)
Potassium: 3.9 mmol/L (ref 3.5–5.1)
Sodium: 137 mmol/L (ref 135–145)
TOTAL PROTEIN: 6.9 g/dL (ref 6.5–8.1)

## 2016-12-12 LAB — CBC
HCT: 41.1 % (ref 36.0–46.0)
HEMOGLOBIN: 13.3 g/dL (ref 12.0–15.0)
MCH: 26 pg (ref 26.0–34.0)
MCHC: 32.4 g/dL (ref 30.0–36.0)
MCV: 80.4 fL (ref 78.0–100.0)
PLATELETS: 308 10*3/uL (ref 150–400)
RBC: 5.11 MIL/uL (ref 3.87–5.11)
RDW: 13.7 % (ref 11.5–15.5)
WBC: 11.2 10*3/uL — ABNORMAL HIGH (ref 4.0–10.5)

## 2016-12-12 LAB — URINALYSIS, ROUTINE W REFLEX MICROSCOPIC
Bilirubin Urine: NEGATIVE
GLUCOSE, UA: NEGATIVE mg/dL
HGB URINE DIPSTICK: NEGATIVE
Ketones, ur: NEGATIVE mg/dL
Leukocytes, UA: NEGATIVE
Nitrite: NEGATIVE
Protein, ur: NEGATIVE mg/dL
SPECIFIC GRAVITY, URINE: 1.013 (ref 1.005–1.030)
pH: 6 (ref 5.0–8.0)

## 2016-12-12 LAB — PREGNANCY, URINE: Preg Test, Ur: NEGATIVE

## 2016-12-12 LAB — LIPASE, BLOOD: Lipase: 23 U/L (ref 11–51)

## 2016-12-12 MED ORDER — DIPHENHYDRAMINE HCL 50 MG/ML IJ SOLN: 25.0000 mg | Freq: Four times a day (QID) | INTRAMUSCULAR | Status: DC | PRN

## 2016-12-12 MED ORDER — METRONIDAZOLE IN NACL 5-0.79 MG/ML-% IV SOLN
500.0000 mg | Freq: Three times a day (TID) | INTRAVENOUS | Status: DC
  Administered 2016-12-12 – 2016-12-14 (×4): 500 mg via INTRAVENOUS
  Filled 2016-12-12 (×7): qty 100

## 2016-12-12 MED ORDER — METOCLOPRAMIDE HCL 5 MG/ML IJ SOLN
5.0000 mg | Freq: Once | INTRAMUSCULAR | Status: AC
  Administered 2016-12-12: 5 mg via INTRAVENOUS
  Filled 2016-12-12: qty 2

## 2016-12-12 MED ORDER — ONDANSETRON 4 MG PO TBDP: 4.0000 mg | ORAL_TABLET | Freq: Four times a day (QID) | ORAL | Status: DC | PRN

## 2016-12-12 MED ORDER — DEXTROSE 5 % IV SOLN
2.0000 g | INTRAVENOUS | Status: DC
  Administered 2016-12-13: 2 g via INTRAVENOUS
  Filled 2016-12-12: qty 2

## 2016-12-12 MED ORDER — SODIUM CHLORIDE 0.9 % IV SOLN
INTRAVENOUS | Status: DC
  Administered 2016-12-12 – 2016-12-14 (×3): via INTRAVENOUS

## 2016-12-12 MED ORDER — PROCHLORPERAZINE MALEATE 10 MG PO TABS
10.0000 mg | ORAL_TABLET | Freq: Four times a day (QID) | ORAL | Status: DC | PRN
  Filled 2016-12-12: qty 1

## 2016-12-12 MED ORDER — ONDANSETRON HCL 4 MG/2ML IJ SOLN: 4.0000 mg | Freq: Four times a day (QID) | INTRAMUSCULAR | Status: DC | PRN

## 2016-12-12 MED ORDER — ACETAMINOPHEN 325 MG PO TABS: 650.0000 mg | ORAL_TABLET | Freq: Four times a day (QID) | ORAL | Status: DC | PRN

## 2016-12-12 MED ORDER — HYDROCODONE-ACETAMINOPHEN 5-325 MG PO TABS
1.0000 | ORAL_TABLET | ORAL | Status: DC | PRN
  Administered 2016-12-13 – 2016-12-14 (×5): 2 via ORAL
  Filled 2016-12-12 (×5): qty 2

## 2016-12-12 MED ORDER — METOPROLOL TARTRATE 5 MG/5ML IV SOLN: 5.0000 mg | Freq: Four times a day (QID) | INTRAVENOUS | Status: DC | PRN

## 2016-12-12 MED ORDER — SODIUM CHLORIDE 0.9 % IV SOLN: INTRAVENOUS | Status: DC

## 2016-12-12 MED ORDER — ACETAMINOPHEN 650 MG RE SUPP: 650.0000 mg | Freq: Four times a day (QID) | RECTAL | Status: DC | PRN

## 2016-12-12 MED ORDER — PROCHLORPERAZINE EDISYLATE 5 MG/ML IJ SOLN: 5.0000 mg | Freq: Four times a day (QID) | INTRAMUSCULAR | Status: DC | PRN

## 2016-12-12 MED ORDER — IBUPROFEN 600 MG PO TABS
600.0000 mg | ORAL_TABLET | Freq: Four times a day (QID) | ORAL | Status: DC | PRN
  Filled 2016-12-12: qty 1

## 2016-12-12 MED ORDER — DEXTROSE 5 % IV SOLN
1.0000 g | Freq: Once | INTRAVENOUS | Status: AC
  Administered 2016-12-12: 1 g via INTRAVENOUS
  Filled 2016-12-12: qty 10

## 2016-12-12 MED ORDER — DEXTROSE 5 % IV SOLN
1.0000 g | INTRAVENOUS | Status: AC
  Administered 2016-12-13: 1 g via INTRAVENOUS
  Filled 2016-12-12: qty 10

## 2016-12-12 MED ORDER — MORPHINE SULFATE (PF) 4 MG/ML IV SOLN
2.0000 mg | INTRAVENOUS | Status: DC | PRN
  Administered 2016-12-13: 2 mg via INTRAVENOUS
  Filled 2016-12-12: qty 1

## 2016-12-12 MED ORDER — DIPHENHYDRAMINE HCL 25 MG PO CAPS: 25.0000 mg | ORAL_CAPSULE | Freq: Four times a day (QID) | ORAL | Status: DC | PRN

## 2016-12-12 MED ORDER — MORPHINE SULFATE (PF) 4 MG/ML IV SOLN
4.0000 mg | Freq: Once | INTRAVENOUS | Status: AC
  Administered 2016-12-12: 4 mg via INTRAVENOUS
  Filled 2016-12-12: qty 1

## 2016-12-12 NOTE — ED Triage Notes (Signed)
Pt was seen at [facility] this morning and abd ct revealed early appendicitis . Pt endorses rlq pain since yesterday. NAD VSS

## 2016-12-12 NOTE — H&P (Addendum)
Reason for Consult: abdominal pain Referring Physician: [name] is an 36 y.o. female.  HPI: 36 yo female with 2 days of abdominal pain. Pain is in the right lower quadrant. It does not radiate. She has never had a pain like this before. She has nausea but no vomiting. She has had diarrhea 10 x in the last 2 days. She normally has chronic constipation. She has not been able to eat anything today.  Past Medical History:  Diagnosis Date  . GERD (gastroesophageal reflux disease)   . Migraine dx'd 05/25/2015  . PONV (postoperative nausea and vomiting)   . Vaginal delivery 2004    Past Surgical History:  Procedure Laterality Date  . REDUCTION MAMMAPLASTY  08/28/2014  . TONSILLECTOMY  2008  . VAGINAL HYSTERECTOMY  06-26-14    Family History  Problem Relation Age of Onset  . Hyperlipidemia Mother   . Hypertension Mother   . Allergic rhinitis Mother   . Asthma Sister   . Allergic rhinitis Sister   . Allergic rhinitis Brother   . Asthma Paternal Uncle     Social History:  reports that  has never smoked. she has never used smokeless tobacco. She reports that she does not drink alcohol or use drugs.  Allergies:  Allergies  Allergen Reactions  . Claritin [Loratadine] Other (See Comments)    Causes hands to shake Tremors  . Other Anaphylaxis    Foods:  Skittles, donuts (all).   Some Makeup.      Medications: I have reviewed the patient's current medications.  Results for orders placed or performed during the hospital encounter of 12/12/16 (from the past 48 hour(s))  Lipase, blood     Status: None   Collection Time: 12/12/16  3:10 PM  Result Value Ref Range   Lipase 23 11 - 51 U/L  Comprehensive metabolic panel     Status: None   Collection Time: 12/12/16  3:10 PM  Result Value Ref Range   Sodium 137 135 - 145 mmol/L   Potassium 3.9 3.5 - 5.1 mmol/L   Chloride 101 101 - 111 mmol/L   CO2 25 22 - 32 mmol/L   Glucose, Bld 82 65 - 99 mg/dL   BUN 9 6 - 20  mg/dL   Creatinine, Ser 0.78 0.44 - 1.00 mg/dL   Calcium 9.1 8.9 - 10.3 mg/dL   Total Protein 6.9 6.5 - 8.1 g/dL   Albumin 3.8 3.5 - 5.0 g/dL   AST 18 15 - 41 U/L   ALT 24 14 - 54 U/L   Alkaline Phosphatase 42 38 - 126 U/L   Total Bilirubin 0.5 0.3 - 1.2 mg/dL   GFR calc non Af Amer >60 >60 mL/min   GFR calc Af Amer >60 >60 mL/min    Comment: (NOTE) The eGFR has been calculated using the CKD EPI equation. This calculation has not been validated in all clinical situations. eGFR's persistently <60 mL/min signify possible Chronic Kidney Disease.    Anion gap 11 5 - 15  CBC     Status: Abnormal   Collection Time: 12/12/16  3:10 PM  Result Value Ref Range   WBC 11.2 (H) 4.0 - 10.5 K/uL   RBC 5.11 3.87 - 5.11 MIL/uL   Hemoglobin 13.3 12.0 - 15.0 g/dL   HCT 41.1 36.0 - 46.0 %   MCV 80.4 78.0 - 100.0 fL   MCH 26.0 26.0 - 34.0 pg   MCHC 32.4 30.0 - 36.0 g/dL   RDW  13.7 11.5 - 15.5 %   Platelets 308 150 - 400 K/uL  Urinalysis, Routine w reflex microscopic     Status: Abnormal   Collection Time: 12/12/16  3:10 PM  Result Value Ref Range   Color, Urine COLORLESS (A) YELLOW   APPearance CLEAR CLEAR   Specific Gravity, Urine 1.013 1.005 - 1.030   pH 6.0 5.0 - 8.0   Glucose, UA NEGATIVE NEGATIVE mg/dL   Hgb urine dipstick NEGATIVE NEGATIVE   Bilirubin Urine NEGATIVE NEGATIVE   Ketones, ur NEGATIVE NEGATIVE mg/dL   Protein, ur NEGATIVE NEGATIVE mg/dL   Nitrite NEGATIVE NEGATIVE   Leukocytes, UA NEGATIVE NEGATIVE  Pregnancy, urine     Status: None   Collection Time: 12/12/16  6:52 PM  Result Value Ref Range   Preg Test, Ur NEGATIVE NEGATIVE    Comment:        THE SENSITIVITY OF THIS METHODOLOGY IS >20 mIU/mL.     No results found.  Review of Systems  Constitutional: Negative for chills and fever.  HENT: Negative for hearing loss.   Eyes: Negative for blurred vision and double vision.  Respiratory: Negative for cough and hemoptysis.   Cardiovascular: Negative for chest pain  and palpitations.  Gastrointestinal: Positive for abdominal pain, diarrhea, nausea and vomiting.  Genitourinary: Negative for dysuria and urgency.  Musculoskeletal: Negative for myalgias and neck pain.  Skin: Negative for itching and rash.  Neurological: Negative for dizziness, tingling and headaches.  Endo/Heme/Allergies: Does not bruise/bleed easily.  Psychiatric/Behavioral: Negative for depression and suicidal ideas.   Blood pressure 127/85, pulse 87, temperature 98.4 F (36.9 C), temperature source Oral, resp. rate 16, weight 116.6 kg (257 lb), last menstrual period 06/25/2014, SpO2 100 %. Physical Exam  Vitals reviewed. Constitutional: She is oriented to person, place, and time. She appears well-developed and well-nourished.  HENT:  Head: Normocephalic and atraumatic.  Eyes: Conjunctivae and EOM are normal. Pupils are equal, round, and reactive to light.  Neck: Normal range of motion. Neck supple.  Cardiovascular: Normal rate and regular rhythm.  Respiratory: Effort normal and breath sounds normal.  GI: Soft. Bowel sounds are normal. She exhibits no distension. There is tenderness in the right lower quadrant. There is no rebound and no guarding.  Musculoskeletal: Normal range of motion.  Neurological: She is alert and oriented to person, place, and time.  Skin: Skin is warm and dry.  Psychiatric: She has a normal mood and affect. Her behavior is normal.      Assessment/Plan: 36 yo female with abdominal pain, CT scan showing 6.84m appendix with no stranding identified, WBC 11.2. Possible early appendicitis vs enteritis -antibiotics -fluids -pain control -recheck labs in am, if improved continue antibiotics, if worse may require appendectomy -stool studies  Dawn [name] 12/12/2016, 9:50 PM

## 2016-12-12 NOTE — ED Notes (Signed)
Surgery at bedside.

## 2016-12-12 NOTE — ED Provider Notes (Signed)
[facility] EMERGENCY DEPARTMENT Provider Note   CSN: [id number] Arrival date & time: 12/12/16  1300     History   Chief Complaint Chief Complaint  Patient presents with  . Abdominal Pain    HPI Dawn [name] is a 36 y.o. female.  HPI Complains of right lower quadrant pain nonradiating onset yesterday morning pain is constant associated symptoms include diminished appetite.  And nausea.  No fever.  She went to urgent care center affiliate with [facility] earlier today had CT scan showing questionable early appendicitis.  She reports that staff from the urgent care center called her and told her to come to the emergency department last bowel movement 2 hours ago, slightly runny.  Pain is worse with changing positions and improved with remaining still.  No treatment prior to coming here Past Medical History:  Diagnosis Date  . GERD (gastroesophageal reflux disease)   . Migraine dx'd 05/25/2015  . PONV (postoperative nausea and vomiting)   . Vaginal delivery 2004    Patient Active Problem List   Diagnosis Date Noted  . Well woman exam 03/15/2016  . Possible exposure to STD 03/15/2016  . Allergic reactions 01/05/2016  . Seasonal and perennial allergic rhinitis 01/05/2016  . Nasal polyps 01/05/2016  . Stroke-like symptoms 05/25/2015  . Cephalalgia   . Essential hypertension   . Breast hypertrophy in female 08/28/2014  . Obesity 04/03/2014  . Back pain 04/03/2014  . Chronic sinusitis 04/03/2014  . Endometriosis determined by laparoscopy 04/03/2014  . Recurrent headache 11/08/2012  . Genital herpes 10/25/2011  . Avitaminosis D 09/05/2011  . Acid reflux 08/05/2011  . Adult BMI 30+ 08/05/2011    Past Surgical History:  Procedure Laterality Date  . REDUCTION MAMMAPLASTY  08/28/2014  . TONSILLECTOMY  2008  . VAGINAL HYSTERECTOMY  06-26-14    OB History    Gravida Para Term Preterm AB Living   3 1 1   2 1    SAB TAB Ectopic Multiple Live Births     2       1       Home Medications    Prior to Admission medications   Medication Sig Start Date End Date Taking? Authorizing Provider  amoxicillin-clavulanate (AUGMENTIN) 875-125 MG tablet Take 1 tablet 2 (two) times daily by mouth. 12/07/16 12/14/16 Yes [provider]  EPINEPHrine 0.3 mg/0.3 mL IJ SOAJ injection Inject 0.3 mg into the muscle once as needed (severe allergic reaction).  01/05/16  Yes [provider]  predniSONE (STERAPRED UNI-PAK 21 TAB) 10 MG (21) TBPK tablet Take 10 mg as directed by mouth. 12/07/16  Yes [provider]  traMADol (ULTRAM) 50 MG tablet Take 2 tablets (100 mg total) by mouth every 6 (six) hours as needed for moderate pain or severe pain. Patient not taking: Reported on 01/05/2016 05/27/15   Kathlen Mody, MD    Family History Family History  Problem Relation Age of Onset  . Hyperlipidemia Mother   . Hypertension Mother   . Allergic rhinitis Mother   . Asthma Sister   . Allergic rhinitis Sister   . Allergic rhinitis Brother   . Asthma Paternal Uncle     Social History Social History   Tobacco Use  . Smoking status: Never Smoker  . Smokeless tobacco: Never Used  Substance Use Topics  . Alcohol use: No    Alcohol/week: 0.0 oz  . Drug use: No     Allergies   Claritin [loratadine] and Other  Review of Systems Review of Systems  Constitutional: Positive for appetite change.  HENT: Negative.   Respiratory: Negative.   Cardiovascular: Negative.   Gastrointestinal: Positive for abdominal pain and nausea.  Musculoskeletal: Negative.   Skin: Negative.   Neurological: Negative.   Psychiatric/Behavioral: Negative.   All other systems reviewed and are negative.    Physical Exam Updated Vital Signs BP 138/83 (BP Location: Left Arm)   Pulse 86   Temp 98.4 F (36.9 C) (Oral)   Resp 17   Wt 116.6 kg (257 lb)   LMP 06/25/2014 (Approximate)   SpO2 99%   BMI 41.48 kg/m   Physical Exam  Constitutional:  She appears well-developed and well-nourished.  HENT:  Head: Normocephalic and atraumatic.  Eyes: Conjunctivae are normal. Pupils are equal, round, and reactive to light.  Neck: Neck supple. No tracheal deviation present. No thyromegaly present.  Cardiovascular: Normal rate and regular rhythm.  No murmur heard. Pulmonary/Chest: Effort normal and breath sounds normal.  Abdominal: Soft. Bowel sounds are normal. She exhibits no distension. There is tenderness.  Obese tender at right lower quadrant  Musculoskeletal: Normal range of motion. She exhibits no edema or tenderness.  Neurological: She is alert. Coordination normal.  Skin: Skin is warm and dry. No rash noted.  Psychiatric: She has a normal mood and affect.  Nursing note and vitals reviewed.    ED Treatments / Results  Labs (all labs ordered are listed, but only abnormal results are displayed) Labs Reviewed  CBC - Abnormal; Notable for the following components:      Result Value   WBC 11.2 (*)    All other components within normal limits  URINALYSIS, ROUTINE W REFLEX MICROSCOPIC - Abnormal; Notable for the following components:   Color, Urine COLORLESS (*)    All other components within normal limits  LIPASE, BLOOD  COMPREHENSIVE METABOLIC PANEL  PREGNANCY, URINE   Results for orders placed or performed during the hospital encounter of 12/12/16  Lipase, blood  Result Value Ref Range   Lipase 23 11 - 51 U/L  Comprehensive metabolic panel  Result Value Ref Range   Sodium 137 135 - 145 mmol/L   Potassium 3.9 3.5 - 5.1 mmol/L   Chloride 101 101 - 111 mmol/L   CO2 25 22 - 32 mmol/L   Glucose, Bld 82 65 - 99 mg/dL   BUN 9 6 - 20 mg/dL   Creatinine, Ser 5.36 0.44 - 1.00 mg/dL   Calcium 9.1 8.9 - 64.4 mg/dL   Total Protein 6.9 6.5 - 8.1 g/dL   Albumin 3.8 3.5 - 5.0 g/dL   AST 18 15 - 41 U/L   ALT 24 14 - 54 U/L   Alkaline Phosphatase 42 38 - 126 U/L   Total Bilirubin 0.5 0.3 - 1.2 mg/dL   GFR calc non Af Amer >60 >60  mL/min   GFR calc Af Amer >60 >60 mL/min   Anion gap 11 5 - 15  CBC  Result Value Ref Range   WBC 11.2 (H) 4.0 - 10.5 K/uL   RBC 5.11 3.87 - 5.11 MIL/uL   Hemoglobin 13.3 12.0 - 15.0 g/dL   HCT 03.4 74.2 - 59.5 %   MCV 80.4 78.0 - 100.0 fL   MCH 26.0 26.0 - 34.0 pg   MCHC 32.4 30.0 - 36.0 g/dL   RDW 63.8 75.6 - 43.3 %   Platelets 308 150 - 400 K/uL  Urinalysis, Routine w reflex microscopic  Result Value Ref Range  Color, Urine COLORLESS (A) YELLOW   APPearance CLEAR CLEAR   Specific Gravity, Urine 1.013 1.005 - 1.030   pH 6.0 5.0 - 8.0   Glucose, UA NEGATIVE NEGATIVE mg/dL   Hgb urine dipstick NEGATIVE NEGATIVE   Bilirubin Urine NEGATIVE NEGATIVE   Ketones, ur NEGATIVE NEGATIVE mg/dL   Protein, ur NEGATIVE NEGATIVE mg/dL   Nitrite NEGATIVE NEGATIVE   Leukocytes, UA NEGATIVE NEGATIVE  Pregnancy, urine  Result Value Ref Range   Preg Test, Ur NEGATIVE NEGATIVE   No results found. EKG  EKG Interpretation None       Radiology No results found.  Procedures Procedures (including critical care time)  Medications Ordered in ED Medications  0.9 %  sodium chloride infusion (not administered)  morphine 4 MG/ML injection 4 mg (not administered)  metoCLOPramide (REGLAN) injection 5 mg (not administered)     Initial Impression / Assessment and Plan / ED Course  I have reviewed the triage vital signs and the nursing notes.  Pertinent labs & imaging results that were available during my care of the patient were reviewed by me and considered in my medical decision making (see chart for details).   8:30 PM feels improved after treatment with intravenous opioids and antiemetics.  Consult to Dr.Kinsinger from general surgery who will come to evaluate patient for surgery.  He is requesting intravenous ceftriaxone which have ordered. Clinically patient has mild appendicitis  No results found. Final Clinical Impressions(s) / ED Diagnoses  Dx acute appendicitis Final  diagnoses:  None    ED Discharge Orders    None       Doug SouJacubowitz, Rhyleigh Grassel, MD 12/12/16 2032

## 2016-12-13 ENCOUNTER — Observation Stay (HOSPITAL_COMMUNITY): Payer: 59 | Admitting: Certified Registered"

## 2016-12-13 ENCOUNTER — Encounter (HOSPITAL_COMMUNITY)
Admission: EM | Disposition: A | Discharge status: Home / Self Care | Payer: Self-pay | Source: Home / Self Care | Attending: Emergency Medicine

## 2016-12-13 HISTORY — PX: LAPAROTOMY: SHX154

## 2016-12-13 HISTORY — PX: APPENDECTOMY: SHX54

## 2016-12-13 LAB — BASIC METABOLIC PANEL
ANION GAP: 7 (ref 5–15)
BUN: 7 mg/dL (ref 6–20)
CALCIUM: 8.6 mg/dL — AB (ref 8.9–10.3)
CO2: 28 mmol/L (ref 22–32)
Chloride: 102 mmol/L (ref 101–111)
Creatinine, Ser: 0.82 mg/dL (ref 0.44–1.00)
Glucose, Bld: 90 mg/dL (ref 65–99)
Potassium: 3.8 mmol/L (ref 3.5–5.1)
SODIUM: 137 mmol/L (ref 135–145)

## 2016-12-13 LAB — GASTROINTESTINAL PANEL BY PCR, STOOL (REPLACES STOOL CULTURE)
ASTROVIRUS: NOT DETECTED
Adenovirus F40/41: NOT DETECTED
CAMPYLOBACTER SPECIES: NOT DETECTED
CRYPTOSPORIDIUM: NOT DETECTED
CYCLOSPORA CAYETANENSIS: NOT DETECTED
ENTEROTOXIGENIC E COLI (ETEC): NOT DETECTED
Entamoeba histolytica: NOT DETECTED
Enteroaggregative E coli (EAEC): NOT DETECTED
Enteropathogenic E coli (EPEC): NOT DETECTED
Giardia lamblia: NOT DETECTED
Norovirus GI/GII: NOT DETECTED
PLESIMONAS SHIGELLOIDES: NOT DETECTED
Rotavirus A: NOT DETECTED
SAPOVIRUS (I, II, IV, AND V): NOT DETECTED
SHIGA LIKE TOXIN PRODUCING E COLI (STEC): NOT DETECTED
Salmonella species: NOT DETECTED
Shigella/Enteroinvasive E coli (EIEC): NOT DETECTED
VIBRIO SPECIES: NOT DETECTED
Vibrio cholerae: NOT DETECTED
Yersinia enterocolitica: NOT DETECTED

## 2016-12-13 LAB — CBC
HCT: 39 % (ref 36.0–46.0)
HEMOGLOBIN: 12.6 g/dL (ref 12.0–15.0)
MCH: 26 pg (ref 26.0–34.0)
MCHC: 32.3 g/dL (ref 30.0–36.0)
MCV: 80.6 fL (ref 78.0–100.0)
Platelets: 307 10*3/uL (ref 150–400)
RBC: 4.84 MIL/uL (ref 3.87–5.11)
RDW: 13.8 % (ref 11.5–15.5)
WBC: 9.6 10*3/uL (ref 4.0–10.5)

## 2016-12-13 LAB — SURGICAL PCR SCREEN
MRSA, PCR: NEGATIVE
STAPHYLOCOCCUS AUREUS: NEGATIVE

## 2016-12-13 SURGERY — LAPAROTOMY, EXPLORATORY
Anesthesia: General | Site: Abdomen

## 2016-12-13 MED ORDER — DEXAMETHASONE SODIUM PHOSPHATE 10 MG/ML IJ SOLN
INTRAMUSCULAR | Status: AC
  Filled 2016-12-13: qty 1

## 2016-12-13 MED ORDER — HYDROMORPHONE HCL 1 MG/ML IJ SOLN: 0.2500 mg | INTRAMUSCULAR | Status: DC | PRN

## 2016-12-13 MED ORDER — NEOSTIGMINE METHYLSULFATE 5 MG/5ML IV SOSY
PREFILLED_SYRINGE | INTRAVENOUS | Status: AC
  Filled 2016-12-13: qty 5

## 2016-12-13 MED ORDER — LIDOCAINE 2% (20 MG/ML) 5 ML SYRINGE
INTRAMUSCULAR | Status: DC | PRN
  Administered 2016-12-13: 60 mg via INTRAVENOUS

## 2016-12-13 MED ORDER — GLYCOPYRROLATE 0.2 MG/ML IJ SOLN
INTRAMUSCULAR | Status: DC | PRN
  Administered 2016-12-13: 0.6 mg via INTRAVENOUS

## 2016-12-13 MED ORDER — OXYCODONE HCL 5 MG/5ML PO SOLN: 5.0000 mg | Freq: Once | ORAL | Status: DC | PRN

## 2016-12-13 MED ORDER — MIDAZOLAM HCL 5 MG/5ML IJ SOLN
INTRAMUSCULAR | Status: DC | PRN
  Administered 2016-12-13: 2 mg via INTRAVENOUS

## 2016-12-13 MED ORDER — BUPIVACAINE-EPINEPHRINE (PF) 0.25% -1:200000 IJ SOLN
INTRAMUSCULAR | Status: DC | PRN
  Administered 2016-12-13: 17 mL

## 2016-12-13 MED ORDER — ONDANSETRON HCL 4 MG/2ML IJ SOLN
INTRAMUSCULAR | Status: DC | PRN
  Administered 2016-12-13: 4 mg via INTRAVENOUS

## 2016-12-13 MED ORDER — FENTANYL CITRATE (PF) 250 MCG/5ML IJ SOLN
INTRAMUSCULAR | Status: AC
  Filled 2016-12-13: qty 5

## 2016-12-13 MED ORDER — INFLUENZA VAC SPLIT QUAD 0.5 ML IM SUSY: 0.5000 mL | PREFILLED_SYRINGE | INTRAMUSCULAR | Status: DC

## 2016-12-13 MED ORDER — FENTANYL CITRATE (PF) 100 MCG/2ML IJ SOLN
INTRAMUSCULAR | Status: DC | PRN
  Administered 2016-12-13 (×3): 100 ug via INTRAVENOUS
  Administered 2016-12-13 (×2): 50 ug via INTRAVENOUS

## 2016-12-13 MED ORDER — LIDOCAINE 2% (20 MG/ML) 5 ML SYRINGE
INTRAMUSCULAR | Status: AC
  Filled 2016-12-13: qty 5

## 2016-12-13 MED ORDER — LACTATED RINGERS IV SOLN
INTRAVENOUS | Status: DC
  Administered 2016-12-13 (×2): via INTRAVENOUS

## 2016-12-13 MED ORDER — NEOSTIGMINE METHYLSULFATE 10 MG/10ML IV SOLN
INTRAVENOUS | Status: DC | PRN
  Administered 2016-12-13: 4 mg via INTRAVENOUS

## 2016-12-13 MED ORDER — ROCURONIUM BROMIDE 10 MG/ML (PF) SYRINGE
PREFILLED_SYRINGE | INTRAVENOUS | Status: DC | PRN
  Administered 2016-12-13: 50 mg via INTRAVENOUS

## 2016-12-13 MED ORDER — ONDANSETRON HCL 4 MG/2ML IJ SOLN
INTRAMUSCULAR | Status: AC
  Filled 2016-12-13: qty 2

## 2016-12-13 MED ORDER — MIDAZOLAM HCL 2 MG/2ML IJ SOLN
INTRAMUSCULAR | Status: AC
  Filled 2016-12-13: qty 2

## 2016-12-13 MED ORDER — PROPOFOL 10 MG/ML IV BOLUS
INTRAVENOUS | Status: AC
  Filled 2016-12-13: qty 20

## 2016-12-13 MED ORDER — BUPIVACAINE-EPINEPHRINE 0.25% -1:200000 IJ SOLN
INTRAMUSCULAR | Status: AC
  Filled 2016-12-13: qty 1

## 2016-12-13 MED ORDER — DEXAMETHASONE SODIUM PHOSPHATE 10 MG/ML IJ SOLN
INTRAMUSCULAR | Status: DC | PRN
  Administered 2016-12-13: 10 mg via INTRAVENOUS

## 2016-12-13 MED ORDER — SUCCINYLCHOLINE CHLORIDE 200 MG/10ML IV SOSY
PREFILLED_SYRINGE | INTRAVENOUS | Status: AC
  Filled 2016-12-13: qty 10

## 2016-12-13 MED ORDER — ROCURONIUM BROMIDE 10 MG/ML (PF) SYRINGE
PREFILLED_SYRINGE | INTRAVENOUS | Status: AC
  Filled 2016-12-13: qty 5

## 2016-12-13 MED ORDER — LACTATED RINGERS IV SOLN
INTRAVENOUS | Status: DC | PRN
  Administered 2016-12-13: 13:00:00 via INTRAVENOUS

## 2016-12-13 MED ORDER — MORPHINE SULFATE (PF) 4 MG/ML IV SOLN
1.0000 mg | INTRAVENOUS | Status: DC | PRN
Start: 2016-12-13 — End: 2016-12-14
  Administered 2016-12-13: 4 mg via INTRAVENOUS
  Filled 2016-12-13: qty 1

## 2016-12-13 MED ORDER — PROPOFOL 10 MG/ML IV BOLUS
INTRAVENOUS | Status: DC | PRN
Start: 2016-12-13 — End: 2016-12-13
  Administered 2016-12-13: 150 mg via INTRAVENOUS

## 2016-12-13 MED ORDER — PHENOL 1.4 % MT LIQD: 1.0000 | OROMUCOSAL | Status: DC | PRN

## 2016-12-13 MED ORDER — SUCCINYLCHOLINE CHLORIDE 200 MG/10ML IV SOSY
PREFILLED_SYRINGE | INTRAVENOUS | Status: DC | PRN
  Administered 2016-12-13: 100 mg via INTRAVENOUS

## 2016-12-13 MED ORDER — OXYCODONE HCL 5 MG PO TABS: 5.0000 mg | ORAL_TABLET | Freq: Once | ORAL | Status: DC | PRN

## 2016-12-13 SURGICAL SUPPLY — 50 items
AIRSTRIP 4 3/4X3 1/4 7185 (GAUZE/BANDAGES/DRESSINGS) ×3 IMPLANT
BLADE CLIPPER SURG (BLADE) IMPLANT
CANISTER SUCT 3000ML PPV (MISCELLANEOUS) ×3 IMPLANT
CHLORAPREP W/TINT 26ML (MISCELLANEOUS) ×3 IMPLANT
COVER SURGICAL LIGHT HANDLE (MISCELLANEOUS) ×3 IMPLANT
CUTTER FLEX LINEAR 45M (STAPLE) ×3 IMPLANT
DERMABOND ADVANCED (GAUZE/BANDAGES/DRESSINGS) ×1
DERMABOND ADVANCED .7 DNX12 (GAUZE/BANDAGES/DRESSINGS) ×2 IMPLANT
DRAPE LAPAROSCOPIC ABDOMINAL (DRAPES) ×3 IMPLANT
DRAPE LAPAROTOMY TRNSV 102X78 (DRAPE) ×3 IMPLANT
DRAPE UTILITY XL STRL (DRAPES) ×6 IMPLANT
DRAPE WARM FLUID 44X44 (DRAPE) ×3 IMPLANT
DRSG OPSITE POSTOP 4X10 (GAUZE/BANDAGES/DRESSINGS) IMPLANT
DRSG OPSITE POSTOP 4X8 (GAUZE/BANDAGES/DRESSINGS) IMPLANT
ELECT BLADE 6.5 EXT (BLADE) IMPLANT
ELECT CAUTERY BLADE 6.4 (BLADE) ×3 IMPLANT
ELECT REM PT RETURN 9FT ADLT (ELECTROSURGICAL) ×3
ELECTRODE REM PT RTRN 9FT ADLT (ELECTROSURGICAL) ×2 IMPLANT
GLOVE BIO SURGEON STRL SZ7.5 (GLOVE) ×3 IMPLANT
GOWN STRL REUS W/ TWL LRG LVL3 (GOWN DISPOSABLE) ×4 IMPLANT
GOWN STRL REUS W/TWL LRG LVL3 (GOWN DISPOSABLE) ×2
KIT BASIN OR (CUSTOM PROCEDURE TRAY) ×3 IMPLANT
KIT ROOM TURNOVER OR (KITS) ×3 IMPLANT
LIGASURE IMPACT 36 18CM CVD LR (INSTRUMENTS) IMPLANT
NS IRRIG 1000ML POUR BTL (IV SOLUTION) ×6 IMPLANT
PACK GENERAL/GYN (CUSTOM PROCEDURE TRAY) ×3 IMPLANT
PAD ARMBOARD 7.5X6 YLW CONV (MISCELLANEOUS) ×6 IMPLANT
RELOAD STAPLE TA45 3.5 REG BLU (ENDOMECHANICALS) ×3 IMPLANT
SHEARS HARMONIC HDI 36CM (ELECTROSURGICAL) ×3 IMPLANT
SPECIMEN JAR LARGE (MISCELLANEOUS) IMPLANT
SPECIMEN JAR SMALL (MISCELLANEOUS) ×3 IMPLANT
SPONGE LAP 18X18 X RAY DECT (DISPOSABLE) IMPLANT
STAPLER VISISTAT 35W (STAPLE) ×3 IMPLANT
SUCTION POOLE TIP (SUCTIONS) ×3 IMPLANT
SUT CHROMIC 2 0 CT 1 (SUTURE) ×3 IMPLANT
SUT CHROMIC 2 0 TIES 18 (SUTURE) ×3 IMPLANT
SUT CHROMIC GUT AB #0 18 (SUTURE) ×3 IMPLANT
SUT PDS AB 1 TP1 96 (SUTURE) ×6 IMPLANT
SUT SILK 2 0 SH CR/8 (SUTURE) ×3 IMPLANT
SUT SILK 2 0 TIES 10X30 (SUTURE) ×3 IMPLANT
SUT SILK 3 0 SH 30 (SUTURE) ×3 IMPLANT
SUT SILK 3 0 SH CR/8 (SUTURE) ×3 IMPLANT
SUT SILK 3 0 TIES 10X30 (SUTURE) ×3 IMPLANT
SUT VIC AB 3-0 SH 18 (SUTURE) IMPLANT
SWAB COLLECTION DEVICE MRSA (MISCELLANEOUS) IMPLANT
SWAB CULTURE ESWAB REG 1ML (MISCELLANEOUS) IMPLANT
TOWEL OR 17X26 10 PK STRL BLUE (TOWEL DISPOSABLE) ×3 IMPLANT
TRAY FOLEY W/METER SILVER 16FR (SET/KITS/TRAYS/PACK) IMPLANT
WATER STERILE IRR 1000ML POUR (IV SOLUTION) ×3 IMPLANT
YANKAUER SUCT BULB TIP NO VENT (SUCTIONS) IMPLANT

## 2016-12-13 NOTE — Progress Notes (Signed)
   Subjective/Chief Complaint: Pain seems to be worse   Objective: Vital signs in last 24 hours: Temp:  [97.8 F (36.6 C)-98.4 F (36.9 C)] 98 F (36.7 C) (11/06 0451) Pulse Rate:  [83-87] 83 (11/06 0451) Resp:  [16-18] 16 (11/06 0451) BP: (120-138)/(70-85) 120/71 (11/06 0451) SpO2:  [96 %-100 %] 98 % (11/06 0451) Weight:  [115 kg (253 lb 8.5 oz)-116.6 kg (257 lb)] 115 kg (253 lb 8.5 oz) (11/05 2253) Last BM Date: 12/12/16  Intake/Output from previous day: 11/05 0701 - 11/06 0700 In: 1050 [I.V.:950; IV Piggyback:100] Out: 200 [Urine:200] Intake/Output this shift: No intake/output data recorded.  General appearance: alert and cooperative Resp: clear to auscultation bilaterally Cardio: regular rate and rhythm GI: soft, moderate tenderness RLQ  Lab Results:  Recent Labs    12/12/16 1510 12/13/16 0543  WBC 11.2* 9.6  HGB 13.3 12.6  HCT 41.1 39.0  PLT 308 307   BMET Recent Labs    12/12/16 1510 12/13/16 0543  NA 137 137  K 3.9 3.8  CL 101 102  CO2 25 28  GLUCOSE 82 90  BUN 9 7  CREATININE 0.78 0.82  CALCIUM 9.1 8.6*   PT/INR No results for input(s): LABPROT, INR in the last 72 hours. ABG No results for input(s): PHART, HCO3 in the last 72 hours.  Invalid input(s): PCO2, PO2  Studies/Results: No results found.  Anti-infectives: Anti-infectives (From admission, onward)   Start     Dose/Rate Route Frequency Ordered Stop   12/13/16 2200  cefTRIAXone (ROCEPHIN) 2 g in dextrose 5 % 50 mL IVPB     2 g 100 mL/hr over 30 Minutes Intravenous Every 24 hours 12/12/16 2149     12/12/16 2230  cefTRIAXone (ROCEPHIN) 1 g in dextrose 5 % 50 mL IVPB     1 g 100 mL/hr over 30 Minutes Intravenous NOW 12/12/16 2219 12/13/16 0112   12/12/16 2200  metroNIDAZOLE (FLAGYL) IVPB 500 mg     500 mg 100 mL/hr over 60 Minutes Intravenous Every 8 hours 12/12/16 2149     12/12/16 2030  cefTRIAXone (ROCEPHIN) 1 g in dextrose 5 % 50 mL IVPB     1 g 100 mL/hr over 30 Minutes  Intravenous  Once 12/12/16 2028 12/12/16 2127      Assessment/Plan: s/p * No surgery found * Although she does not have a fever or wbc, her RLQ pain seems worse. The options at this point are continued observation vs diagnostic laparoscopy with possible appendectomy. She favors surgery. I have discussed with her the risks and benefits of the operation as well as some of the technical aspects and she understands and wishes to proceed  LOS: 0 days    TOTH III,PAUL S 12/13/2016

## 2016-12-13 NOTE — Op Note (Signed)
12/13/2016  2:52 PM  PATIENT:  Dawn SprinklesBetty [name]  36 y.o. female  PRE-OPERATIVE DIAGNOSIS:  appendicitis  POST-OPERATIVE DIAGNOSIS:  appendicitis  PROCEDURE:  Procedure(s): DIAGNOSTIC LAPAROTOMY (N/A) LAPAROSCOPIC  APPENDECTOMY (N/A)  SURGEON:  Surgeon(s) and Role:    Griselda Miner* Toth, Paul III, MD - Primary  PHYSICIAN ASSISTANT:   ASSISTANTS: none   ANESTHESIA:   local and general  EBL:  25 mL   BLOOD ADMINISTERED:none  DRAINS: none   LOCAL MEDICATIONS USED:  MARCAINE     SPECIMEN:  Source of Specimen:  appendix  DISPOSITION OF SPECIMEN:  PATHOLOGY  COUNTS:  YES  TOURNIQUET:  * No tourniquets in log *  DICTATION: .Dragon Dictation   After informed consent was obtained patient was brought to the operating room placed in the supine position on the operating room table. After adequate induction of general anesthesia the patient's abdomen was prepped with ChloraPrep, allowed to dry, and draped in usual sterile manner. An appropriate timeout was performed. The area below the umbilicus was infiltrated with quarter percent Marcaine. A small incision was made with a 15 blade knife. This incision was carried down through the subcutaneous tissue bluntly with a hemostat and [employer] retractors until the linea alba was identified. The linea alba was incised with a 15 blade knife. Each side was grasped Coker clamps and elevated anteriorly. The preperitoneal space was probed bluntly with a hemostat until the peritoneum was opened and access was gained to the abdominal cavity. A 0 Vicryl purse string stitch was placed in the fascia surrounding the opening. A Hassan cannula was placed through the opening and anchored in place with the previously placed Vicryl purse string stitch. The laparoscope was placed through the [facility]ula. The abdomen was insufflated with carbon dioxide without difficulty. Next the suprapubic area was infiltrated with quarter percent Marcaine. A small incision was made with a 15  blade knife. A 5 mm port was placed bluntly through this incision into the abdominal cavity. A site was then chosen between the 2 port for placement of a 5 mm port. The area was infiltrated with quarter percent Marcaine. A small stab incision was made with a 15 blade knife. A 5 mm port was placed bluntly through this incision and the abdominal cavity under direct vision. The laparoscope was then moved to the suprapubic port. Using a Glassman grasper and harmonic scalpel the abdomen was inspected. The ovaries were identified and appeared somewhat normal. There were no lesions identified or blood in the pelvis. The appendix was readily identified. There was some mild inflammation of the tip of the appendix. No other abnormalities were identified. The appendix was elevated anteriorly and the mesoappendix was taken down sharply with the harmonic scalpel. Once the base of the appendix where it joined the cecum was identified and cleared of any tissue then a laparoscopic GIA blue load 6 row stapler was placed through the [facility] cannula. The stapler was placed across the base of the appendix clamped and fired thereby dividing the base of the appendix between staple lines. A laparoscopic bag was then inserted through the [facility] cannula. The appendix was placed within the bag and the bag was sealed. The abdomen was then irrigated with copious amounts of saline until the effluent was clear. No other abnormalities were noted. The appendix and bag were removed with the [facility] cannula through the infraumbilical port without difficulty. The fascial defect was closed with the previously placed Vicryl pursestring stitch as well as with another interrupted 0 Vicryl  figure-of-eight stitch. The rest of the ports were removed under direct vision and were found to be hemostatic. The gas was allowed to escape. The skin incisions were closed with interrupted 4-0 Monocryl subcuticular stitches. Dermabond dressings were applied. The patient  tolerated the procedure well. At the end of the case all needle sponge and instrument counts were correct. The patient was then awakened and taken to recovery in stable condition.  PLAN OF CARE: Admit for overnight observation  PATIENT DISPOSITION:  PACU - hemodynamically stable.   Delay start of Pharmacological VTE agent (>24hrs) due to surgical blood loss or risk of bleeding: no

## 2016-12-13 NOTE — Transfer of Care (Signed)
Immediate Anesthesia Transfer of Care Note  Patient: Dawn [name]  Procedure(s) Performed: DIAGNOSTIC LAPAROTOMY (N/A Abdomen) POSSIBLE APPENDECTOMY (N/A )  Patient Location: PACU  Anesthesia Type:General  Level of Consciousness: patient cooperative and responds to stimulation  Airway & Oxygen Therapy: Patient Spontanous Breathing and Patient connected to nasal cannula oxygen  Post-op Assessment: Report given to RN and Post -op Vital signs reviewed and stable  Post vital signs: Reviewed and stable  Last Vitals:  Vitals:   12/13/16 0451 12/13/16 1442  BP: 120/71 (!) (P) 145/80  Pulse: 83 (!) (P) 101  Resp: 16 (!) (P) 21  Temp: 36.7 C (!) 36.4 C  SpO2: 98% (P) 98%    Last Pain:  Vitals:   12/13/16 1442  TempSrc:   PainSc: (P) 0-No pain         Complications: No apparent anesthesia complications

## 2016-12-13 NOTE — Anesthesia Preprocedure Evaluation (Addendum)
Anesthesia Evaluation  Patient identified by MRN, date of birth, ID band Patient awake    Reviewed: Allergy & Precautions, NPO status   Airway Mallampati: I  TM Distance: >3 FB Neck ROM: Full    Dental no notable dental hx.    Pulmonary    breath sounds clear to auscultation       Cardiovascular hypertension,  Rhythm:Regular Rate:Normal     Neuro/Psych  Headaches,    GI/Hepatic Neg liver ROS, GERD  ,  Endo/Other  Morbid obesity  Renal/GU      Musculoskeletal   Abdominal   Peds  Hematology negative hematology ROS (+)   Anesthesia Other Findings   Reproductive/Obstetrics                            Anesthesia Physical Anesthesia Plan  ASA: II  Anesthesia Plan: General   Post-op Pain Management:    Induction: Intravenous  PONV Risk Score and Plan: 4 or greater and Ondansetron, Dexamethasone, Midazolam and Treatment may vary due to age or medical condition  Airway Management Planned: Oral ETT  Additional Equipment:   Intra-op Plan:   Post-operative Plan: Extubation in OR  Informed Consent: I have reviewed the patients History and Physical, chart, labs and discussed the procedure including the risks, benefits and alternatives for the proposed anesthesia with the patient or authorized representative who has indicated his/her understanding and acceptance.   Dental advisory given  Plan Discussed with: CRNA  Anesthesia Plan Comments:         Anesthesia Quick Evaluation

## 2016-12-13 NOTE — Progress Notes (Signed)
Received patient from ED accompanied by family members.  Patient AOx4, ambulatory, VS within normal limits, and denies pain.  Patient oriented to room, bed control and call lights. Will monitor.

## 2016-12-13 NOTE — Progress Notes (Signed)
Unable to get a stool sample from patient's recent BM since it is liquid color yellow and probably had mixed with some urine.  Will monitor for BM again and endorse to day shift RN if no BM is collected for the shift.

## 2016-12-14 ENCOUNTER — Encounter (HOSPITAL_COMMUNITY): Payer: Self-pay | Admitting: General Surgery

## 2016-12-14 ENCOUNTER — Other Ambulatory Visit: Payer: Self-pay

## 2016-12-14 DIAGNOSIS — K5909 Other constipation: Secondary | ICD-10-CM | POA: Diagnosis not present

## 2016-12-14 DIAGNOSIS — K219 Gastro-esophageal reflux disease without esophagitis: Secondary | ICD-10-CM | POA: Diagnosis not present

## 2016-12-14 DIAGNOSIS — K3580 Unspecified acute appendicitis: Secondary | ICD-10-CM | POA: Diagnosis not present

## 2016-12-14 MED ORDER — HYDROCODONE-ACETAMINOPHEN 5-325 MG PO TABS: 1.0000 | ORAL_TABLET | Freq: Four times a day (QID) | ORAL | 0 refills | Status: DC | PRN

## 2016-12-14 MED ORDER — ALUM & MAG HYDROXIDE-SIMETH 200-200-20 MG/5ML PO SUSP
30.0000 mL | ORAL | Status: DC | PRN
  Administered 2016-12-14: 30 mL via ORAL
  Filled 2016-12-14: qty 30

## 2016-12-14 NOTE — Discharge Summary (Signed)
Physician Discharge Summary  Patient ID: Dawn SprinklesBetty [name] MRN: [id number] DOB/AGE: 36-Sep-1982 36 y.o.  Admit date: 12/12/2016 Discharge date: 12/14/2016  Admission Diagnoses:  Appendicitis vs enteritis  Discharge Diagnoses:  Appendicitis    Active Problems:   Abdominal pain   PROCEDURES: Laparoscopic appendectomy, 12/13/16, Dr. Bufford Lopeoth  Hospital Course:   36 yo female with 2 days of abdominal pain. Pain is in the right lower quadrant. It does not radiate. She has never had a pain like this before. She has nausea but no vomiting. She has had diarrhea 10 x in the last 2 days. She normally has chronic constipation. She has not been able to eat anything today. CT scan showing 6.519mm appendix with no stranding identified, WBC 11.2. Possible early appendicitis vs enteritis.  She was seen and admitted by DR. Kinsinger.  Started on antibiotics and IV fluids.  The following AM her pain was worse and after evaluation by DR. Carolynne Edouardoth she was taken to the OR.  Post op she was seen by DR.Carolynne Edouardoth and is doing well, aim for DC later this AM.    CBC Latest Ref Rng & Units 12/13/2016 12/12/2016 08/16/2016  WBC 4.0 - 10.5 K/uL 9.6 11.2(H) 8.8  Hemoglobin 12.0 - 15.0 g/dL 08.612.6 57.813.3 46.912.8  Hematocrit 36.0 - 46.0 % 39.0 41.1 40.0  Platelets 150 - 400 K/uL 307 308 313    CMP Latest Ref Rng & Units 12/13/2016 12/12/2016 08/16/2016  Glucose 65 - 99 mg/dL 90 82 99  BUN 6 - 20 mg/dL 7 9 10   Creatinine 0.44 - 1.00 mg/dL 6.290.82 5.280.78 4.130.83  Sodium 135 - 145 mmol/L 137 137 138  Potassium 3.5 - 5.1 mmol/L 3.8 3.9 3.9  Chloride 101 - 111 mmol/L 102 101 105  CO2 22 - 32 mmol/L 28 25 25   Calcium 8.9 - 10.3 mg/dL 2.4(M8.6(L) 9.1 8.9  Total Protein 6.5 - 8.1 g/dL - 6.9 -  Total Bilirubin 0.3 - 1.2 mg/dL - 0.5 -  Alkaline Phos 38 - 126 U/L - 42 -  AST 15 - 41 U/L - 18 -  ALT 14 - 54 U/L - 24 -    Condition on d/c:  Improved    Disposition: 01-Home or Self Care  Discharge Instructions    Call MD for:  difficulty breathing, headache or  visual disturbances   Complete by:  As directed    Call MD for:  extreme fatigue   Complete by:  As directed    Call MD for:  hives   Complete by:  As directed    Call MD for:  persistant dizziness or light-headedness   Complete by:  As directed    Call MD for:  persistant nausea and vomiting   Complete by:  As directed    Call MD for:  redness, tenderness, or signs of infection (pain, swelling, redness, odor or green/yellow discharge around incision site)   Complete by:  As directed    Call MD for:  severe uncontrolled pain   Complete by:  As directed    Call MD for:  temperature >100.4   Complete by:  As directed    Diet - low sodium heart healthy   Complete by:  As directed    Discharge instructions   Complete by:  As directed    May shower. No heavy lifting. Diet as tolerated   Increase activity slowly   Complete by:  As directed    No wound care   Complete by:  As directed  Allergies as of 12/14/2016      Reactions   Claritin [loratadine] Other (See Comments)   Causes hands to shake Tremors   Other Anaphylaxis   Foods:  Skittles, donuts (all).   Some Makeup.        Medication List    TAKE these medications   amoxicillin-clavulanate 875-125 MG tablet Commonly known as:  AUGMENTIN Take 1 tablet 2 (two) times daily by mouth.   EPINEPHrine 0.3 mg/0.3 mL Soaj injection Commonly known as:  EPI-PEN Inject 0.3 mg into the muscle once as needed (severe allergic reaction).   HYDROcodone-acetaminophen 5-325 MG tablet Commonly known as:  NORCO/VICODIN Take 1-2 tablets every 6 (six) hours as needed by mouth for moderate pain.   predniSONE 10 MG (21) Tbpk tablet Commonly known as:  STERAPRED UNI-PAK 21 TAB Take 10 mg as directed by mouth.   traMADol 50 MG tablet Commonly known as:  ULTRAM Take 2 tablets (100 mg total) by mouth every 6 (six) hours as needed for moderate pain or severe pain.      Follow-up Information    Chevis Prettyoth, Paul III, MD Follow up in 2  week(s).   Specialty:  General Surgery Contact information: [address] [place] [address] [phone]           Signed: Sherrie GeorgeJENNINGS,Dory Verdun 12/14/2016, 9:41 AM

## 2016-12-14 NOTE — Discharge Instructions (Signed)
Laparoscopic Appendectomy, Adult, Care After °These instructions give you information about caring for yourself after your procedure. Your doctor may also give you more specific instructions. Call your doctor if you have any problems or questions after your procedure. °Follow these instructions at home: °Medicines °· Take over-the-counter and prescription medicines only as told by your doctor. °· Do not drive for 24 hours if you received a sedative. °· Do not drive or use heavy machinery while taking prescription pain medicine. °· If you were prescribed an antibiotic medicine, take it as told by your doctor. Do not stop taking it even if you start to feel better. °Activity °· Do not lift anything that is heavier than 10 pounds (4.5 kg) for 3 weeks or as told by your doctor. °· Do not play contact sports for 3 weeks or as told by your doctor. °· Slowly return to your normal activities. °Bathing °· Keep your cuts from surgery (incisions) clean and dry. °? Gently wash the cuts with soap and water. °? Rinse the cuts with water until the soap is gone. °? Pat the cuts dry with a clean towel. Do not rub the cuts. °· You may take showers after 48 hours. °· Do not take baths, swim, or use a hot tub for 2 weeks or as told by your doctor. °Cut Care °· Follow instructions from your doctor about how to take care of your cuts. Make sure you: °? Wash your hands with soap and water before you change your bandage (dressing). If you do not have soap and water, use hand sanitizer. °? Change your bandage as told by your doctor. °? Leave stitches (sutures), skin glue, or skin tape (adhesive) strips in place. They may need to stay in place for 2 weeks or longer. If tape strips get loose and curl up, you may trim the loose edges. Do not remove tape strips completely unless your doctor says it is okay. °· Check your cuts every day for signs of infection. Check for: °? More redness, swelling, or pain. °? More fluid or  blood. °? Warmth. °? Pus or a bad smell. °Other Instructions °· If you were sent home with a drain, follow instructions from your doctor about how to use it and care for it. °· Take deep breaths. This helps to keep your lungs from getting swollen (inflamed). °· To help with constipation: °? Drink plenty of fluids. °? Eat plenty of fruits and vegetables. °· Keep all follow-up visits as told by your doctor. This is important. °Contact a doctor if: °· You have more redness, swelling, or pain around a cut from surgery. °· You have more fluid or blood coming from a cut. °· Your cut feels warm to the touch. °· You have pus or a bad smell coming from a cut or a bandage. °· The edges of a cut break open after the stitches have been taken out. °· You have pain in your shoulders that gets worse. °· You feel dizzy or you pass out (faint). °· You have shortness of breath. °· You keep feeling sick to your stomach (nauseous). °· You keep throwing up (vomiting). °· You get diarrhea or you cannot control your poop. °· You lose your appetite. °· You have swelling or pain in your legs. °Get help right away if: °· You have a fever. °· You get a rash. °· You have trouble breathing. °· You have sharp pains in your chest. °This information is not intended to replace advice given   to you by your health care provider. Make sure you discuss any questions you have with your health care provider. °Document Released: 11/20/2008 Document Revised: 07/02/2015 Document Reviewed: 07/14/2014 °Elsevier Interactive Patient Education © 2018 [employer] ° °CCS ______[facility] °LAPAROSCOPIC SURGERY: POST OP INSTRUCTIONS °Always review your discharge instruction sheet given to you by the facility where your surgery was performed. °IF YOU HAVE DISABILITY OR FAMILY LEAVE FORMS, YOU MUST BRING THEM TO THE OFFICE FOR PROCESSING.   °DO NOT GIVE THEM TO YOUR DOCTOR. ° °1. A prescription for pain medication may be given to you upon  discharge.  Take your pain medication as prescribed, if needed.  If narcotic pain medicine is not needed, then you may take acetaminophen (Tylenol) or ibuprofen (Advil) as needed. °2. Take your usually prescribed medications unless otherwise directed. °3. If you need a refill on your pain medication, please contact your pharmacy.  They will contact our office to request authorization. Prescriptions will not be filled after 5pm or on week-ends. °4. You should follow a light diet the first few days after arrival home, such as soup and crackers, etc.  Be sure to include lots of fluids daily. °5. Most patients will experience some swelling and bruising in the area of the incisions.  Ice packs will help.  Swelling and bruising can take several days to resolve.  °6. It is common to experience some constipation if taking pain medication after surgery.  Increasing fluid intake and taking a stool softener (such as Colace) will usually help or prevent this problem from occurring.  A mild laxative (Milk of Magnesia or Miralax) should be taken according to package instructions if there are no bowel movements after 48 hours. °7. Unless discharge instructions indicate otherwise, you may remove your bandages 24-48 hours after surgery, and you may shower at that time.  You may have steri-strips (small skin tapes) in place directly over the incision.  These strips should be left on the skin for 7-10 days.  If your surgeon used skin glue on the incision, you may shower in 24 hours.  The glue will flake off over the next 2-3 weeks.  Any sutures or staples will be removed at the office during your follow-up visit. °8. ACTIVITIES:  You may resume regular (light) daily activities beginning the next day--such as daily self-care, walking, climbing stairs--gradually increasing activities as tolerated.  You may have sexual intercourse when it is comfortable.  Refrain from any heavy lifting or straining until approved by your doctor. °a. You  may drive when you are no longer taking prescription pain medication, you can comfortably wear a seatbelt, and you can safely maneuver your car and apply brakes. °b. RETURN TO WORK:  __________________________________________________________ °9. You should see your doctor in the office for a follow-up appointment approximately 2-3 weeks after your surgery.  Make sure that you call for this appointment within a day or two after you arrive home to insure a convenient appointment time. °10. OTHER INSTRUCTIONS: __________________________________________________________________________________________________________________________ __________________________________________________________________________________________________________________________ °WHEN TO CALL YOUR DOCTOR: °1. Fever over 101.0 °2. Inability to urinate °3. Continued bleeding from incision. °4. Increased pain, redness, or drainage from the incision. °5. Increasing abdominal pain ° °The clinic staff is available to answer your questions during regular business hours.  Please don’t hesitate to call and ask to speak to one of the nurses for clinical concerns.  If you have a medical emergency, go to the nearest emergency room or call 911.  A surgeon from   [facility] is always on call at the hospital. °[address], [address], [place], [place]  [address] ? [address] [address], [place], [place]   [address] °[phone] ? [phone] ? FAX [phone] °Web site: [email or web address] ° °

## 2016-12-14 NOTE — Progress Notes (Signed)
1 Day Post-Op   Subjective/Chief Complaint: Feels much better. Presenting pain is gone   Objective: Vital signs in last 24 hours: Temp:  [97.2 F (36.2 C)-98.9 F (37.2 C)] 98.3 F (36.8 C) (11/07 0540) Pulse Rate:  [64-101] 64 (11/07 0540) Resp:  [18-22] 18 (11/07 0540) BP: (118-145)/(65-84) 120/69 (11/07 0540) SpO2:  [90 %-98 %] 98 % (11/07 0540) Last BM Date: 12/13/16  Intake/Output from previous day: 11/06 0701 - 11/07 0700 In: 3699.7 [P.O.:760; I.V.:2689.7; IV Piggyback:250] Out: 125 [Urine:100; Blood:25] Intake/Output this shift: No intake/output data recorded.  General appearance: alert and cooperative Resp: clear to auscultation bilaterally Cardio: regular rate and rhythm GI: soft, mild tenderness  Lab Results:  Recent Labs    12/12/16 1510 12/13/16 0543  WBC 11.2* 9.6  HGB 13.3 12.6  HCT 41.1 39.0  PLT 308 307   BMET Recent Labs    12/12/16 1510 12/13/16 0543  NA 137 137  K 3.9 3.8  CL 101 102  CO2 25 28  GLUCOSE 82 90  BUN 9 7  CREATININE 0.78 0.82  CALCIUM 9.1 8.6*   PT/INR No results for input(s): LABPROT, INR in the last 72 hours. ABG No results for input(s): PHART, HCO3 in the last 72 hours.  Invalid input(s): PCO2, PO2  Studies/Results: No results found.  Anti-infectives: Anti-infectives (From admission, onward)   Start     Dose/Rate Route Frequency Ordered Stop   12/13/16 2200  cefTRIAXone (ROCEPHIN) 2 g in dextrose 5 % 50 mL IVPB     2 g 100 mL/hr over 30 Minutes Intravenous Every 24 hours 12/12/16 2149     12/12/16 2230  cefTRIAXone (ROCEPHIN) 1 g in dextrose 5 % 50 mL IVPB     1 g 100 mL/hr over 30 Minutes Intravenous NOW 12/12/16 2219 12/13/16 0112   12/12/16 2200  metroNIDAZOLE (FLAGYL) IVPB 500 mg     500 mg 100 mL/hr over 60 Minutes Intravenous Every 8 hours 12/12/16 2149     12/12/16 2030  cefTRIAXone (ROCEPHIN) 1 g in dextrose 5 % 50 mL IVPB     1 g 100 mL/hr over 30 Minutes Intravenous  Once 12/12/16 2028 12/12/16  2127      Assessment/Plan: s/p Procedure(s): DIAGNOSTIC LAPAROTOMY (N/A) POSSIBLE APPENDECTOMY (N/A) Advance diet  Hopefully ready for discharge later today  LOS: 0 days    TOTH III,Tashanda Fuhrer S 12/14/2016

## 2016-12-14 NOTE — Anesthesia Postprocedure Evaluation (Signed)
Anesthesia Post Note  Patient: Dawn SprinklesBetty [name]  Procedure(s) Performed: DIAGNOSTIC LAPAROTOMY (N/A Abdomen) POSSIBLE APPENDECTOMY (N/A )     Patient location during evaluation: PACU Anesthesia Type: General Level of consciousness: awake and alert Pain management: pain level controlled Vital Signs Assessment: post-procedure vital signs reviewed and stable Respiratory status: spontaneous breathing, nonlabored ventilation, respiratory function stable and patient connected to nasal cannula oxygen Cardiovascular status: blood pressure returned to baseline and stable Postop Assessment: no apparent nausea or vomiting Anesthetic complications: no    Last Vitals:  Vitals:   12/14/16 0249 12/14/16 0540  BP: 118/65 120/69  Pulse: 81 64  Resp: 18 18  Temp: 36.8 C 36.8 C  SpO2: 96% 98%    Last Pain:  Vitals:   12/14/16 0735  TempSrc:   PainSc: 5                  Eshan Trupiano,JAMES TERRILL

## 2016-12-14 NOTE — Progress Notes (Signed)
1 Day Post-Op    CC:  Chest pain mid chest started yesterday  Subjective: Pt post op lap appy and for discharge, now complaining of midline burning going to the right side. No SOB, worse with IS.  She has not been OOB except to BR.  She does not appear in any distress.  Objective: Vital signs in last 24 hours: Temp:  [97.2 F (36.2 C)-98.9 F (37.2 C)] 98.3 F (36.8 C) (11/07 0540) Pulse Rate:  [64-101] 64 (11/07 0540) Resp:  [18-22] 18 (11/07 0540) BP: (118-145)/(65-84) 120/69 (11/07 0540) SpO2:  [90 %-98 %] 98 % (11/07 0540) Last BM Date: 12/13/16 760 PO Intake/Output from previous day: 11/06 0701 - 11/07 0700 In: 3699.7 [P.O.:760; I.V.:2689.7; IV Piggyback:250] Out: 125 [Urine:100; Blood:25] Intake/Output this shift: No intake/output data recorded.  General appearance: alert, cooperative and no distress Resp: clear to auscultation bilaterally Cardio: regular rate and rhythm, S1, S2 normal, no murmur, click, rub or gallop GI: soft, sore, tolerating breakfast.    Lab Results:  Recent Labs    12/12/16 1510 12/13/16 0543  WBC 11.2* 9.6  HGB 13.3 12.6  HCT 41.1 39.0  PLT 308 307    BMET Recent Labs    12/12/16 1510 12/13/16 0543  NA 137 137  K 3.9 3.8  CL 101 102  CO2 25 28  GLUCOSE 82 90  BUN 9 7  CREATININE 0.78 0.82  CALCIUM 9.1 8.6*   PT/INR No results for input(s): LABPROT, INR in the last 72 hours.  Recent Labs  Lab 12/12/16 1510  AST 18  ALT 24  ALKPHOS 42  BILITOT 0.5  PROT 6.9  ALBUMIN 3.8     Lipase     Component Value Date/Time   LIPASE 23 12/12/2016 1510     Medications: . Influenza vac split quadrivalent PF  0.5 mL Intramuscular Tomorrow-1000   . sodium chloride 125 mL/hr at 12/13/16 1044  . cefTRIAXone (ROCEPHIN)  IV Stopped (12/13/16 2210)   And  . metronidazole Stopped (12/14/16 [id number])  . lactated ringers Stopped (12/13/16 1437)    Assessment/Plan Appendicitis S/p laparoscopic appendectomy, 12/13/16, Dr. Carolynne Edouardoth Hx of  GERD Hx of migraines Body mass index is 40.9 FEN:  IV fluids/regular diet DVT:  SCD Follow up:   DOW  Plan:  Mylanta, check EKG, see how she does with GERD Rx.  She appears comfortable.  Hold discharge till we sort out chest discomfort.    Pt feels better after taking Mylanta, eating lunch, hamberger and fries.  Ready to go home.  EKG shows sinus rhythm with no ST changes.      LOS: 0 days    Cortland Crehan 12/14/2016 [phone]

## 2016-12-26 ENCOUNTER — Other Ambulatory Visit: Payer: Self-pay

## 2016-12-26 ENCOUNTER — Encounter (HOSPITAL_COMMUNITY): Payer: Self-pay

## 2016-12-26 ENCOUNTER — Emergency Department (HOSPITAL_COMMUNITY): Payer: 59

## 2016-12-26 ENCOUNTER — Observation Stay (HOSPITAL_COMMUNITY)
Admission: EM | Admit: 2016-12-26 | Discharge: 2016-12-28 | Disposition: A | Discharge status: Home / Self Care | Payer: 59 | Attending: General Surgery | Admitting: General Surgery

## 2016-12-26 DIAGNOSIS — R1031 Right lower quadrant pain: Secondary | ICD-10-CM | POA: Diagnosis present

## 2016-12-26 DIAGNOSIS — E559 Vitamin D deficiency, unspecified: Secondary | ICD-10-CM | POA: Diagnosis not present

## 2016-12-26 DIAGNOSIS — K5900 Constipation, unspecified: Secondary | ICD-10-CM | POA: Diagnosis not present

## 2016-12-26 DIAGNOSIS — I10 Essential (primary) hypertension: Secondary | ICD-10-CM | POA: Insufficient documentation

## 2016-12-26 DIAGNOSIS — R197 Diarrhea, unspecified: Secondary | ICD-10-CM | POA: Insufficient documentation

## 2016-12-26 DIAGNOSIS — K219 Gastro-esophageal reflux disease without esophagitis: Secondary | ICD-10-CM | POA: Insufficient documentation

## 2016-12-26 DIAGNOSIS — Z91018 Allergy to other foods: Secondary | ICD-10-CM | POA: Insufficient documentation

## 2016-12-26 DIAGNOSIS — Z6841 Body Mass Index (BMI) 40.0 and over, adult: Secondary | ICD-10-CM | POA: Insufficient documentation

## 2016-12-26 DIAGNOSIS — Z9049 Acquired absence of other specified parts of digestive tract: Secondary | ICD-10-CM | POA: Insufficient documentation

## 2016-12-26 DIAGNOSIS — Z888 Allergy status to other drugs, medicaments and biological substances status: Secondary | ICD-10-CM | POA: Diagnosis not present

## 2016-12-26 DIAGNOSIS — Z79899 Other long term (current) drug therapy: Secondary | ICD-10-CM | POA: Diagnosis not present

## 2016-12-26 DIAGNOSIS — K760 Fatty (change of) liver, not elsewhere classified: Secondary | ICD-10-CM | POA: Insufficient documentation

## 2016-12-26 DIAGNOSIS — E669 Obesity, unspecified: Secondary | ICD-10-CM | POA: Diagnosis not present

## 2016-12-26 DIAGNOSIS — R112 Nausea with vomiting, unspecified: Secondary | ICD-10-CM | POA: Diagnosis not present

## 2016-12-26 LAB — COMPREHENSIVE METABOLIC PANEL
ALT: 24 U/L (ref 14–54)
AST: 20 U/L (ref 15–41)
Albumin: 3.7 g/dL (ref 3.5–5.0)
Alkaline Phosphatase: 37 U/L — ABNORMAL LOW (ref 38–126)
Anion gap: 7 (ref 5–15)
BUN: 13 mg/dL (ref 6–20)
CO2: 28 mmol/L (ref 22–32)
Calcium: 8.4 mg/dL — ABNORMAL LOW (ref 8.9–10.3)
Chloride: 105 mmol/L (ref 101–111)
Creatinine, Ser: 0.93 mg/dL (ref 0.44–1.00)
GFR calc Af Amer: >60 mL/min (ref >60)
GFR calc non Af Amer: >60 mL/min (ref >60)
Glucose, Bld: 100 mg/dL — ABNORMAL HIGH (ref 65–99)
Potassium: 3.5 mmol/L (ref 3.5–5.1)
Sodium: 140 mmol/L (ref 135–145)
Total Bilirubin: 0.7 mg/dL (ref 0.3–1.2)
Total Protein: 7.1 g/dL (ref 6.5–8.1)

## 2016-12-26 LAB — CBC
HCT: 38.6 % (ref 36.0–46.0)
Hemoglobin: 12.4 g/dL (ref 12.0–15.0)
MCH: 26 pg (ref 26.0–34.0)
MCHC: 32.1 g/dL (ref 30.0–36.0)
MCV: 80.9 fL (ref 78.0–100.0)
Platelets: 345 10*3/uL (ref 150–400)
RBC: 4.77 MIL/uL (ref 3.87–5.11)
RDW: 13.2 % (ref 11.5–15.5)
WBC: 6.9 10*3/uL (ref 4.0–10.5)

## 2016-12-26 LAB — URINALYSIS, ROUTINE W REFLEX MICROSCOPIC
Bilirubin Urine: NEGATIVE
Glucose, UA: NEGATIVE mg/dL
Hgb urine dipstick: NEGATIVE
Ketones, ur: NEGATIVE mg/dL
Leukocytes, UA: NEGATIVE
Nitrite: NEGATIVE
Protein, ur: NEGATIVE mg/dL
Specific Gravity, Urine: 1.019 (ref 1.005–1.030)
pH: 6 (ref 5.0–8.0)

## 2016-12-26 LAB — LIPASE, BLOOD: Lipase: 26 U/L (ref 11–51)

## 2016-12-26 MED ORDER — IOPAMIDOL (ISOVUE-300) INJECTION 61%
100.0000 mL | Freq: Once | INTRAVENOUS | Status: AC | PRN
  Administered 2016-12-26: 100 mL via INTRAVENOUS

## 2016-12-26 MED ORDER — ONDANSETRON HCL 4 MG/2ML IJ SOLN
4.0000 mg | Freq: Once | INTRAMUSCULAR | Status: AC
  Administered 2016-12-26: 4 mg via INTRAVENOUS
  Filled 2016-12-26: qty 2

## 2016-12-26 MED ORDER — ACETAMINOPHEN 325 MG PO TABS
650.0000 mg | ORAL_TABLET | Freq: Four times a day (QID) | ORAL | Status: DC | PRN
  Administered 2016-12-26 – 2016-12-27 (×3): 650 mg via ORAL
  Filled 2016-12-26 (×3): qty 2

## 2016-12-26 MED ORDER — DIPHENHYDRAMINE HCL 50 MG/ML IJ SOLN: 25.0000 mg | Freq: Four times a day (QID) | INTRAMUSCULAR | Status: DC | PRN

## 2016-12-26 MED ORDER — PANTOPRAZOLE SODIUM 40 MG IV SOLR
40.0000 mg | Freq: Every day | INTRAVENOUS | Status: DC
  Administered 2016-12-26 – 2016-12-27 (×2): 40 mg via INTRAVENOUS
  Filled 2016-12-26 (×2): qty 40

## 2016-12-26 MED ORDER — ONDANSETRON HCL 4 MG/2ML IJ SOLN: 4.0000 mg | Freq: Four times a day (QID) | INTRAMUSCULAR | Status: DC | PRN

## 2016-12-26 MED ORDER — MORPHINE SULFATE (PF) 2 MG/ML IV SOLN: 1.0000 mg | INTRAVENOUS | Status: DC | PRN

## 2016-12-26 MED ORDER — HYDRALAZINE HCL 20 MG/ML IJ SOLN: 10.0000 mg | INTRAMUSCULAR | Status: DC | PRN

## 2016-12-26 MED ORDER — HYDROCODONE-ACETAMINOPHEN 5-325 MG PO TABS
1.0000 | ORAL_TABLET | ORAL | Status: DC | PRN
  Administered 2016-12-27 – 2016-12-28 (×2): 2 via ORAL
  Filled 2016-12-26 (×2): qty 2

## 2016-12-26 MED ORDER — DIPHENHYDRAMINE HCL 25 MG PO CAPS: 25.0000 mg | ORAL_CAPSULE | Freq: Four times a day (QID) | ORAL | Status: DC | PRN

## 2016-12-26 MED ORDER — ONDANSETRON 4 MG PO TBDP
4.0000 mg | ORAL_TABLET | Freq: Four times a day (QID) | ORAL | Status: DC | PRN
  Administered 2016-12-27 – 2016-12-28 (×2): 4 mg via ORAL
  Filled 2016-12-26 (×2): qty 1

## 2016-12-26 MED ORDER — SODIUM CHLORIDE 0.45 % IV SOLN
INTRAVENOUS | Status: DC
  Administered 2016-12-26: 125 mL/h via INTRAVENOUS
  Administered 2016-12-26: 19:00:00 via INTRAVENOUS
  Administered 2016-12-27: 125 mL/h via INTRAVENOUS

## 2016-12-26 MED ORDER — IOPAMIDOL (ISOVUE-300) INJECTION 61%
INTRAVENOUS | Status: AC
  Filled 2016-12-26: qty 100

## 2016-12-26 MED ORDER — ENOXAPARIN SODIUM 40 MG/0.4ML ~~LOC~~ SOLN
40.0000 mg | SUBCUTANEOUS | Status: DC
  Administered 2016-12-27: 40 mg via SUBCUTANEOUS
  Filled 2016-12-26: qty 0.4

## 2016-12-26 MED ORDER — SODIUM CHLORIDE 0.9 % IV BOLUS (SEPSIS)
1000.0000 mL | Freq: Once | INTRAVENOUS | Status: AC
  Administered 2016-12-26: 1000 mL via INTRAVENOUS

## 2016-12-26 MED ORDER — ACETAMINOPHEN 650 MG RE SUPP: 650.0000 mg | Freq: Four times a day (QID) | RECTAL | Status: DC | PRN

## 2016-12-26 MED ORDER — SIMETHICONE 80 MG PO CHEW: 40.0000 mg | CHEWABLE_TABLET | Freq: Four times a day (QID) | ORAL | Status: DC | PRN

## 2016-12-26 NOTE — ED Notes (Signed)
ED Provider at bedside. 

## 2016-12-26 NOTE — H&P (Signed)
[facility] Admission Note  Dawn [name] August 05, 1980  [id number].    Requesting MD: Wilson Singer Chief Complaint/Reason for Consult: Abdominal pain  HPI:  Dawn [name] is a 36yo female 2 weeks s/p laparoscopic appendectomy 11/6 by Dr. Marlou Starks. States that she has had intermittent abdominal pain since surgery, but this has gotten worse over the last 2-3 days. She reports lower abdominal pain, mostly in the RLQ and suprapubic region. It is worse with ambulation or flexing her hip. She first began experiencing diarrhea 11/15, and then n/v on 11/17. She also reports progressive abdominal distension over the past few days. Denies fever or chills, heartburn, CP, SOB, cough, or dysuria. Denies any recent sick contacts. States that she had a normal breakfast this morning and tolerated it well. She had a bowel movement this morning and it was normal.   ROS: Review of Systems  Constitutional: Negative.   HENT: Negative.   Eyes: Negative.   Respiratory: Negative.   Cardiovascular: Negative.   Gastrointestinal: Positive for abdominal pain, diarrhea, nausea and vomiting. Negative for blood in stool, constipation, heartburn and melena.  Genitourinary: Negative.   Musculoskeletal: Negative.   Skin: Negative.   Neurological: Negative.     All systems reviewed and otherwise negative except for as above  Family History  Problem Relation Age of Onset  . Hyperlipidemia Mother   . Hypertension Mother   . Allergic rhinitis Mother   . Asthma Sister   . Allergic rhinitis Sister   . Allergic rhinitis Brother   . Asthma Paternal Uncle     Past Medical History:  Diagnosis Date  . GERD (gastroesophageal reflux disease)   . Migraine dx'd 05/25/2015  . PONV (postoperative nausea and vomiting)   . Vaginal delivery 2004    Past Surgical History:  Procedure Laterality Date  . BILATERAL BREAST  REDUCTION Bilateral 08/28/2014   Performed by Crissie Reese, MD at [facility]  . DIAGNOSTIC LAPAROTOMY N/A  12/13/2016   Performed by Jovita Kussmaul, MD at [facility] OR  . LAPAROSCOPY OPERATIVE N/A 02/18/2013   Performed by Olga Millers, MD at [facility] ORS  . POSSIBLE APPENDECTOMY N/A 12/13/2016   Performed by Jovita Kussmaul, MD at [facility]  08/28/2014  . TONSILLECTOMY  2008  . VAGINAL HYSTERECTOMY  06-26-14  . YAG LASER APPLICATION N/A 05/01/4980   Performed by Olga Millers, MD at [facility]    Social History:  reports that  has never smoked. she has never used smokeless tobacco. She reports that she does not drink alcohol or use drugs.  Allergies:  Allergies  Allergen Reactions  . Claritin [Loratadine] Other (See Comments)    Causes hands to shake Tremors  . Other Anaphylaxis    Foods:  Skittles, donuts (all).   Some Makeup.       (Not in a hospital admission)  Prior to Admission medications   Medication Sig Start Date End Date Taking? Authorizing Provider  EPINEPHrine 0.3 mg/0.3 mL IJ SOAJ injection Inject 0.3 mg into the muscle once as needed (severe allergic reaction).  01/05/16  Yes [provider]  ondansetron (ZOFRAN-ODT) 4 MG disintegrating tablet Take 1 tablet every 4 (four) hours as needed by mouth for nausea/vomiting. 12/19/16  Yes [provider]  HYDROcodone-acetaminophen (NORCO/VICODIN) 5-325 MG tablet Take 1-2 tablets every 6 (six) hours as needed by mouth for moderate pain. Patient not taking: Reported on 12/26/2016 12/14/16   Autumn Messing III, MD  traMADol Veatrice Bourbon) 50 MG tablet  Take 2 tablets (100 mg total) by mouth every 6 (six) hours as needed for moderate pain or severe pain. Patient not taking: Reported on 01/05/2016 05/27/15   Hosie Poisson, MD    Blood pressure 123/88, pulse 93, temperature 98.4 F (36.9 C), temperature source Oral, resp. rate 16, height _0  (1.676 m), weight 257 lb (116.6 kg), last menstrual period 06/25/2014, SpO2 100 %. Physical Exam: General: pleasant, WD/WN AA female who is laying in bed in NAD HEENT: head is  normocephalic, atraumatic.  Sclera are noninjected.  Pupils equal and round.  Ears and nose without any masses or lesions.  Mouth is pink and moist. Dentition fair Heart: regular, rate, and rhythm.  No obvious murmurs, gallops, or rubs noted.  Palpable pedal pulses bilaterally Lungs: CTAB, no wheezes, rhonchi, or rales noted.  Respiratory effort nonlabored Abd: multiple well healing lap incisions cdi with no erythema or drainage. Soft, mild distension, +BS in all 4 quadrants, no masses, hernias, or organomegaly. TTP RLQ with no rebound or guarding MS: all 4 extremities are symmetrical with no cyanosis, clubbing, or edema. Skin: warm and dry with no masses, lesions, or rashes Psych: A&Ox3 with an appropriate affect. Neuro: cranial nerves grossly intact, extremity CSM intact bilaterally, normal speech  Results for orders placed or performed during the hospital encounter of 12/26/16 (from the past 48 hour(s))  Lipase, blood     Status: None   Collection Time: 12/26/16 11:42 AM  Result Value Ref Range   Lipase 26 11 - 51 U/L  Comprehensive metabolic panel     Status: Abnormal   Collection Time: 12/26/16 11:42 AM  Result Value Ref Range   Sodium 140 135 - 145 mmol/L   Potassium 3.5 3.5 - 5.1 mmol/L   Chloride 105 101 - 111 mmol/L   CO2 28 22 - 32 mmol/L   Glucose, Bld 100 (H) 65 - 99 mg/dL   BUN 13 6 - 20 mg/dL   Creatinine, Ser 0.93 0.44 - 1.00 mg/dL   Calcium 8.4 (L) 8.9 - 10.3 mg/dL   Total Protein 7.1 6.5 - 8.1 g/dL   Albumin 3.7 3.5 - 5.0 g/dL   AST 20 15 - 41 U/L   ALT 24 14 - 54 U/L   Alkaline Phosphatase 37 (L) 38 - 126 U/L   Total Bilirubin 0.7 0.3 - 1.2 mg/dL   GFR calc non Af Amer >60 >60 mL/min   GFR calc Af Amer >60 >60 mL/min    Comment: (NOTE) The eGFR has been calculated using the CKD EPI equation. This calculation has not been validated in all clinical situations. eGFR's persistently <60 mL/min signify possible Chronic Kidney Disease.    Anion gap 7 5 - 15  CBC      Status: None   Collection Time: 12/26/16 11:42 AM  Result Value Ref Range   WBC 6.9 4.0 - 10.5 K/uL   RBC 4.77 3.87 - 5.11 MIL/uL   Hemoglobin 12.4 12.0 - 15.0 g/dL   HCT 38.6 36.0 - 46.0 %   MCV 80.9 78.0 - 100.0 fL   MCH 26.0 26.0 - 34.0 pg   MCHC 32.1 30.0 - 36.0 g/dL   RDW 13.2 11.5 - 15.5 %   Platelets 345 150 - 400 K/uL  Urinalysis, Routine w reflex microscopic     Status: None   Collection Time: 12/26/16  1:59 PM  Result Value Ref Range   Color, Urine YELLOW YELLOW   APPearance CLEAR CLEAR   Specific Gravity, Urine  1.019 1.005 - 1.030   pH 6.0 5.0 - 8.0   Glucose, UA NEGATIVE NEGATIVE mg/dL   Hgb urine dipstick NEGATIVE NEGATIVE   Bilirubin Urine NEGATIVE NEGATIVE   Ketones, ur NEGATIVE NEGATIVE mg/dL   Protein, ur NEGATIVE NEGATIVE mg/dL   Nitrite NEGATIVE NEGATIVE   Leukocytes, UA NEGATIVE NEGATIVE   Ct Abdomen Pelvis W Contrast  Result Date: 12/26/2016 CLINICAL DATA:  Nausea since 12/19/2016 with incisional pain status post appendectomy on 12/13/2016. Diarrhea. EXAM: CT ABDOMEN AND PELVIS WITH CONTRAST TECHNIQUE: Multidetector CT imaging of the abdomen and pelvis was performed using the standard protocol following bolus administration of intravenous contrast. CONTRAST:  100 cc ISOVUE-300 IOPAMIDOL (ISOVUE-300) INJECTION 61% COMPARISON:  None. FINDINGS: Lower chest: Normal size cardiac chambers. No pericardial effusion. Clear lung bases. Hepatobiliary: Hepatic steatosis. No biliary dilatation. No hepatic mass. Nondistended gallbladder. No gallstones. Pancreas: Normal Spleen: Normal Adrenals/Urinary Tract: Normal bilateral adrenal glands. Symmetric cortical enhancement of both kidneys. No obstructive uropathy or nephrolithiasis. Nondistended urinary bladder. Stomach/Bowel: Status post appendectomy. No abnormal fluid collection or abscess is seen adjacent to the appendectomy site. Physiologic distention of the stomach. Normal small bowel rotation without obstruction or  inflammation. Moderate fecal residue within the colon without obstruction. Vascular/Lymphatic: No significant vascular findings are present. No enlarged abdominal or pelvic lymph nodes. Reproductive: Status post hysterectomy. No adnexal masses. Other: Postsurgical change deep to the umbilicus without abnormal fluid collection, hemorrhage nor abscess. A small amount of intraperitoneal free air is noted, conceivably from prior laparoscopic procedure though the air should have resolved by now. An occult perforation of hollow viscus cannot be entirely excluded. Musculoskeletal: No acute or significant osseous findings. IMPRESSION: 1. Postsurgical induration deep to the umbilicus consistent with recent surgical change. No postoperative abscess, fluid collection or hematoma. 2.  No evidence of bowel obstruction or inflammation. 3. Small amount of free intraperitoneal air is noted conceivably residual from prior laparoscopic procedure though one would expect resolution of the intraperitoneal air from laparoscopic insufflation by now. Occult perforation of hollow viscus is not entirely excluded though there are no secondary signs of free intraperitoneal fluid or abscess identified. Suggest short-term interval follow-up to assure resolution and stability. These results were called by telephone at the time of interpretation on 12/26/2016 at 2:51 pm to [place] , who verbally acknowledged these results. 4. Hepatic steatosis. Electronically Signed   By: Ashley Royalty M.D.   On: 12/26/2016 14:51      Assessment/Plan Abdominal pain, nausea, vomiting, diarrhea S/p laparoscopic appendectomy 11/6 Dr. Marlou Starks - 2 weeks out from surgery - has had intermittent abdominal pain, nausea, vomiting, distension, and diarrhea since surgery, but has been worse x2-3 days - WBC 6.9, afebrile, VSS - CT scan shows small amount of free intraperitoneal air, no secondary signs of free intraperitoneal fluid or abscess identified - Plan to  admit patient to med-surg for observation. No signs of peritonitis on exam, no need for acute surgical intervention. Ok for clear liquids. Continue IVF, pain control, antiemetics. Check stool studies including c diff. Repeat CBC/BMP in AM. Will monitor closely with serial abdominal exams.  ID - none VTE - SCDs, lovenox FEN - IVF, NPO Foley - none Follow up - TBD   Wellington Hampshire, [facility] 12/26/2016, 4:09 PM Pager: [phone] Consults: [phone] Mon-Fri 7:00 am-4:30 pm Sat-Sun 7:00 am-11:30 am

## 2016-12-26 NOTE — ED Notes (Signed)
ED TO INPATIENT HANDOFF REPORT  Name/Age/Gender Dawn [name] 36 y.o. female  Code Status    Code Status Orders  (From admission, onward)        Start     Ordered   12/26/16 1607  Full code  Continuous     12/26/16 1608    Code Status History    Date Active Date Inactive Code Status Order ID Comments User Context   12/12/2016 21:49 12/14/2016 19:54 Full Code [id number]  Kinsinger, Arta Bruce, MD ED   05/25/2015 08:26 05/27/2015 18:27 Full Code [id number]  Willia Craze, NP ED      Home/SNF/Other Home  Chief Complaint dehydration, abd pain  Level of Care/Admitting Diagnosis ED Disposition    ED Disposition Condition [place]: [facility] [100102]  Level of Care: Med-Surg [16]  Diagnosis: Status post laparoscopic appendectomy [729021]  Admitting Physician: CCS, [place]  Attending Physician: CCS, [place]  Bed request comments: 5 west  PT Class (Do Not Modify): Observation [104]  PT Acc Code (Do Not Modify): Observation [10022]       Medical History Past Medical History:  Diagnosis Date  . GERD (gastroesophageal reflux disease)   . Migraine dx'd 05/25/2015  . PONV (postoperative nausea and vomiting)   . Vaginal delivery 2004    Allergies Allergies  Allergen Reactions  . Claritin [Loratadine] Other (See Comments)    Causes hands to shake Tremors  . Other Anaphylaxis    Foods:  Skittles, donuts (all).   Some Makeup.      IV Location/Drains/Wounds Patient Lines/Drains/Airways Status   Active Line/Drains/Airways    Name:   Placement date:   Placement time:   Site:   Days:   Peripheral IV 12/26/16 Right Forearm   12/26/16    1302    Forearm   less than 1   Incision 02/18/13 Abdomen Other (Comment)   02/18/13    1250     1407   Incision 02/18/13 Vagina Other (Comment)   02/18/13    1250     1407   Incision (Closed) 08/28/14 Breast Bilateral   08/28/14    1005     851   Incision (Closed) 12/13/16 Abdomen Other  (Comment)   12/13/16    1432     13   Incision - 2 Ports Abdomen Umbilicus Right;Superior   02/18/13    1236     1407   Incision - 3 Ports Abdomen 1: Umbilicus 2: Left;Superior 3: Left;Lower   12/13/16    1413     13          Labs/Imaging Results for orders placed or performed during the hospital encounter of 12/26/16 (from the past 48 hour(s))  Lipase, blood     Status: None   Collection Time: 12/26/16 11:42 AM  Result Value Ref Range   Lipase 26 11 - 51 U/L  Comprehensive metabolic panel     Status: Abnormal   Collection Time: 12/26/16 11:42 AM  Result Value Ref Range   Sodium 140 135 - 145 mmol/L   Potassium 3.5 3.5 - 5.1 mmol/L   Chloride 105 101 - 111 mmol/L   CO2 28 22 - 32 mmol/L   Glucose, Bld 100 (H) 65 - 99 mg/dL   BUN 13 6 - 20 mg/dL   Creatinine, Ser 0.93 0.44 - 1.00 mg/dL   Calcium 8.4 (L) 8.9 - 10.3 mg/dL   Total Protein 7.1 6.5 - 8.1  g/dL   Albumin 3.7 3.5 - 5.0 g/dL   AST 20 15 - 41 U/L   ALT 24 14 - 54 U/L   Alkaline Phosphatase 37 (L) 38 - 126 U/L   Total Bilirubin 0.7 0.3 - 1.2 mg/dL   GFR calc non Af Amer >60 >60 mL/min   GFR calc Af Amer >60 >60 mL/min    Comment: (NOTE) The eGFR has been calculated using the CKD EPI equation. This calculation has not been validated in all clinical situations. eGFR's persistently <60 mL/min signify possible Chronic Kidney Disease.    Anion gap 7 5 - 15  CBC     Status: None   Collection Time: 12/26/16 11:42 AM  Result Value Ref Range   WBC 6.9 4.0 - 10.5 K/uL   RBC 4.77 3.87 - 5.11 MIL/uL   Hemoglobin 12.4 12.0 - 15.0 g/dL   HCT 38.6 36.0 - 46.0 %   MCV 80.9 78.0 - 100.0 fL   MCH 26.0 26.0 - 34.0 pg   MCHC 32.1 30.0 - 36.0 g/dL   RDW 13.2 11.5 - 15.5 %   Platelets 345 150 - 400 K/uL  Urinalysis, Routine w reflex microscopic     Status: None   Collection Time: 12/26/16  1:59 PM  Result Value Ref Range   Color, Urine YELLOW YELLOW   APPearance CLEAR CLEAR   Specific Gravity, Urine 1.019 1.005 - 1.030   pH  6.0 5.0 - 8.0   Glucose, UA NEGATIVE NEGATIVE mg/dL   Hgb urine dipstick NEGATIVE NEGATIVE   Bilirubin Urine NEGATIVE NEGATIVE   Ketones, ur NEGATIVE NEGATIVE mg/dL   Protein, ur NEGATIVE NEGATIVE mg/dL   Nitrite NEGATIVE NEGATIVE   Leukocytes, UA NEGATIVE NEGATIVE   Ct Abdomen Pelvis W Contrast  Result Date: 12/26/2016 CLINICAL DATA:  Nausea since 12/19/2016 with incisional pain status post appendectomy on 12/13/2016. Diarrhea. EXAM: CT ABDOMEN AND PELVIS WITH CONTRAST TECHNIQUE: Multidetector CT imaging of the abdomen and pelvis was performed using the standard protocol following bolus administration of intravenous contrast. CONTRAST:  100 cc ISOVUE-300 IOPAMIDOL (ISOVUE-300) INJECTION 61% COMPARISON:  None. FINDINGS: Lower chest: Normal size cardiac chambers. No pericardial effusion. Clear lung bases. Hepatobiliary: Hepatic steatosis. No biliary dilatation. No hepatic mass. Nondistended gallbladder. No gallstones. Pancreas: Normal Spleen: Normal Adrenals/Urinary Tract: Normal bilateral adrenal glands. Symmetric cortical enhancement of both kidneys. No obstructive uropathy or nephrolithiasis. Nondistended urinary bladder. Stomach/Bowel: Status post appendectomy. No abnormal fluid collection or abscess is seen adjacent to the appendectomy site. Physiologic distention of the stomach. Normal small bowel rotation without obstruction or inflammation. Moderate fecal residue within the colon without obstruction. Vascular/Lymphatic: No significant vascular findings are present. No enlarged abdominal or pelvic lymph nodes. Reproductive: Status post hysterectomy. No adnexal masses. Other: Postsurgical change deep to the umbilicus without abnormal fluid collection, hemorrhage nor abscess. A small amount of intraperitoneal free air is noted, conceivably from prior laparoscopic procedure though the air should have resolved by now. An occult perforation of hollow viscus cannot be entirely excluded. Musculoskeletal:  No acute or significant osseous findings. IMPRESSION: 1. Postsurgical induration deep to the umbilicus consistent with recent surgical change. No postoperative abscess, fluid collection or hematoma. 2.  No evidence of bowel obstruction or inflammation. 3. Small amount of free intraperitoneal air is noted conceivably residual from prior laparoscopic procedure though one would expect resolution of the intraperitoneal air from laparoscopic insufflation by now. Occult perforation of hollow viscus is not entirely excluded though there are no secondary signs of free  intraperitoneal fluid or abscess identified. Suggest short-term interval follow-up to assure resolution and stability. These results were called by telephone at the time of interpretation on 12/26/2016 at 2:51 pm to [place] , who verbally acknowledged these results. 4. Hepatic steatosis. Electronically Signed   By: Ashley Royalty M.D.   On: 12/26/2016 14:51    Pending Labs Unresulted Labs (From admission, onward)   Start     Ordered   01/02/17 0500  Creatinine, serum  (enoxaparin (LOVENOX)    CrCl >/= 30 ml/min)  Weekly,   R    Comments:  while on enoxaparin therapy    12/26/16 1608   12/27/16 1025  Basic metabolic panel  Tomorrow morning,   R     12/26/16 1608   12/27/16 0500  CBC  Tomorrow morning,   R     12/26/16 1608   12/26/16 1631  Gastrointestinal Panel by PCR , Stool  (Gastrointestinal Panel by PCR, Stool)  Once,   R     12/26/16 1631   12/26/16 1631  C difficile quick scan w PCR reflex  (C Difficile quick screen w PCR reflex panel)  Once, for 48 hours,   R    Comments:  Laxatives (last 72 hours)   None     Question Answer Comment  Is your patient experiencing loose or watery stools (3 or more in 24 hours)? Yes   Has the patient received laxatives in the last 24 hours? No   Has a negative Cdiff test resulted in the last 7 days? No      12/26/16 1631      Vitals/Pain Today's Vitals   12/26/16 1128 12/26/16 1308 12/26/16  1538 12/26/16 1600  BP:  118/75 123/88 134/89  Pulse:  81 93 88  Resp:  16  16  Temp:      TempSrc:      SpO2:  100% 100% 100%  Weight:      Height:      PainSc: 8        Isolation Precautions Enteric precautions (UV disinfection)  Medications Medications  enoxaparin (LOVENOX) injection 40 mg (not administered)  0.45 % sodium chloride infusion (not administered)  hydrALAZINE (APRESOLINE) injection 10 mg (not administered)  pantoprazole (PROTONIX) injection 40 mg (not administered)  simethicone (MYLICON) chewable tablet 40 mg (not administered)  ondansetron (ZOFRAN-ODT) disintegrating tablet 4 mg (not administered)    Or  ondansetron (ZOFRAN) injection 4 mg (not administered)  diphenhydrAMINE (BENADRYL) capsule 25 mg (not administered)    Or  diphenhydrAMINE (BENADRYL) injection 25 mg (not administered)  acetaminophen (TYLENOL) tablet 650 mg (not administered)    Or  acetaminophen (TYLENOL) suppository 650 mg (not administered)  HYDROcodone-acetaminophen (NORCO/VICODIN) 5-325 MG per tablet 1-2 tablet (not administered)  morphine 2 MG/ML injection 1-2 mg (not administered)  sodium chloride 0.9 % bolus 1,000 mL (0 mLs Intravenous Stopped 12/26/16 1452)  ondansetron (ZOFRAN) injection 4 mg (4 mg Intravenous Given 12/26/16 1257)  iopamidol (ISOVUE-300) 61 % injection 100 mL (100 mLs Intravenous Contrast Given 12/26/16 1416)    Mobility walks

## 2016-12-26 NOTE — ED Notes (Signed)
Bed assigned 16:23 rm 1525, call report at 16:43

## 2016-12-26 NOTE — ED Notes (Signed)
Patient transported to CT 

## 2016-12-26 NOTE — ED Provider Notes (Signed)
[facility]-EMERGENCY DEPT Provider Note   CSN: [id number] Arrival date & time: 12/26/16  1029     History   Chief Complaint Chief Complaint  Patient presents with  . Abdominal Pain  . Emesis  . Diarrhea    HPI Dawn [name] is a 36 y.o. female.  HPI   Dawn [name] is a 36 y.o. female, with a history of recent appendectomy, migraines, and GERD, presenting to the ED with nausea, vomiting, diarrhea, and abdominal pain beginning November 15.  Patient underwent appendectomy by Dr. Carolynne Edouardoth on 11/06.  States she called Dr. Billey Changoth's office today and was told to come to the ED for labs and abdominal CT. Denies fever/chills, hematochezia/melena, chest pain, shortness of breath, urinary symptoms, or any other complaints.   Past Medical History:  Diagnosis Date  . GERD (gastroesophageal reflux disease)   . Migraine dx'd 05/25/2015  . PONV (postoperative nausea and vomiting)   . Vaginal delivery 2004    Patient Active Problem List   Diagnosis Date Noted  . Abdominal pain 12/12/2016  . Well woman exam 03/15/2016  . Possible exposure to STD 03/15/2016  . Allergic reactions 01/05/2016  . Seasonal and perennial allergic rhinitis 01/05/2016  . Nasal polyps 01/05/2016  . Stroke-like symptoms 05/25/2015  . Cephalalgia   . Essential hypertension   . Breast hypertrophy in female 08/28/2014  . Obesity 04/03/2014  . Back pain 04/03/2014  . Chronic sinusitis 04/03/2014  . Endometriosis determined by laparoscopy 04/03/2014  . Recurrent headache 11/08/2012  . Genital herpes 10/25/2011  . Avitaminosis D 09/05/2011  . Acid reflux 08/05/2011  . Adult BMI 30+ 08/05/2011    Past Surgical History:  Procedure Laterality Date  . BILATERAL BREAST  REDUCTION Bilateral 08/28/2014   Performed by Etter SjogrenBowers, David[facility]  . DIAGNOSTIC LAPAROTOMY N/A 12/13/2016   [facility]formed [facility] Griselda Mineroth[facility] Paul III[facility]  . LAPAROSCOPY OPERATIVE N/A 02/18/2013   Performed by Levi AlandAnderson, Mark E, [facility]  . POSSIBLE APPENDECTOMY N/A 12/13/2016   [facility]formed by Griselda [facility]oth[facility] Paul [facility]  . REDUCTION MAMMAPLASTY  08/28/2014  . TONSILLECTOMY  2008  . VAGINAL HYSTERECTOMY  06-26-14  . YAG LASER APPLICATION N/A 02/18/2013   Performed [facility] Levi [facility]andAnderson[facility] Mark E[facility]    OB History    Gravida Para Term Preterm AB Living   3 1 1   2 1    SAB TAB Ectopic Multiple Live Births   2       1       Home Medications    Prior to Admission medications   Medication Sig Start Date End Date Taking? Authorizing Provider  EPINEPHrine 0.3 mg/0.3 mL IJ SOAJ injection Inject 0.3 mg into the muscle once as needed (severe allergic reaction).  01/05/16  Yes [provider]  ondansetron (ZOFRAN-ODT) 4 MG disintegrating tablet Take 1 tablet every 4 (four) hours as needed by mouth for nausea/vomiting. 12/19/16  Yes [provider]  HYDROcodone-acetaminophen (NORCO/VICODIN) 5-325 MG tablet Take 1-2 tablets every 6 (six) hours as needed by mouth for moderate pain. Patient not taking: Reported on 12/26/2016 12/14/16   Chevis Prettyoth, Paul III, MD  traMADol (ULTRAM) 50 MG tablet Take 2 tablets (100 mg total) by mouth every 6 (six) hours as needed for moderate pain or severe pain. Patient not taking: Reported on 01/05/2016 05/27/15   Kathlen ModyAkula, Vijaya, MD    Family History Family History  Problem Relation Age of Onset  .  Hyperlipidemia Mother   . Hypertension Mother   . Allergic rhinitis Mother   . Asthma Sister   . Allergic rhinitis Sister   . Allergic rhinitis Brother   . Asthma Paternal Uncle     Social History Social History   Tobacco Use  . Smoking status: Never Smoker  . Smokeless tobacco: Never Used  Substance Use Topics  . Alcohol use: No    Alcohol/week: 0.0 oz  . Drug use: No     Allergies   Claritin [loratadine] and Other   Review of Systems Review of Systems  Constitutional: Negative for chills, diaphoresis and fever.  Respiratory: Negative for shortness of breath.     Cardiovascular: Negative for chest pain.  Gastrointestinal: Positive for abdominal pain, diarrhea, nausea and vomiting.  Genitourinary: Negative for dysuria, flank pain, hematuria, vaginal bleeding and vaginal discharge.  All other systems reviewed and are negative.    Physical Exam Updated Vital Signs BP 138/90 (BP Location: Right Arm)   Pulse 86   Temp 98.4 F (36.9 C) (Oral)   Resp 16   Ht 5\' 6"  (1.676 m)   Wt 116.6 kg (257 lb)   LMP 06/25/2014 (Approximate)   SpO2 97%   BMI 41.48 kg/m   Physical Exam  Constitutional: She appears well-developed and well-nourished. No distress.  HENT:  Head: Normocephalic and atraumatic.  Eyes: Conjunctivae are normal.  Neck: Neck supple.  Cardiovascular: Normal rate, regular rhythm, normal heart sounds and intact distal pulses.  Pulmonary/Chest: Effort normal and breath sounds normal. No respiratory distress.  Abdominal: Soft. Bowel sounds are normal. There is tenderness in the right lower quadrant. There is rebound. There is no guarding.  Musculoskeletal: She exhibits no edema.  Lymphadenopathy:    She has no cervical adenopathy.  Neurological: She is alert.  Skin: Skin is warm and dry. She is not diaphoretic.  Psychiatric: She has a normal mood and affect. Her behavior is normal.  Nursing note and vitals reviewed.    ED Treatments / Results  Labs (all labs ordered are listed, but only abnormal results are displayed) Labs Reviewed  COMPREHENSIVE METABOLIC PANEL - Abnormal; Notable for the following components:      Result Value   Glucose, Bld 100 (*)    Calcium 8.4 (*)    Alkaline Phosphatase 37 (*)    All other components within normal limits  LIPASE, BLOOD  CBC  URINALYSIS, ROUTINE W REFLEX MICROSCOPIC    EKG  EKG Interpretation None       Radiology Ct Abdomen Pelvis W Contrast  Result Date: 12/26/2016 CLINICAL DATA:  Nausea since 12/19/2016 with incisional pain status post appendectomy on 12/13/2016.  Diarrhea. EXAM: CT ABDOMEN AND PELVIS WITH CONTRAST TECHNIQUE: Multidetector CT imaging of the abdomen and pelvis was performed using the standard protocol following bolus administration of intravenous contrast. CONTRAST:  100 cc ISOVUE-300 IOPAMIDOL (ISOVUE-300) INJECTION 61% COMPARISON:  None. FINDINGS: Lower chest: Normal size cardiac chambers. No pericardial effusion. Clear lung bases. Hepatobiliary: Hepatic steatosis. No biliary dilatation. No hepatic mass. Nondistended gallbladder. No gallstones. Pancreas: Normal Spleen: Normal Adrenals/Urinary Tract: Normal bilateral adrenal glands. Symmetric cortical enhancement of both kidneys. No obstructive uropathy or nephrolithiasis. Nondistended urinary bladder. Stomach/Bowel: Status post appendectomy. No abnormal fluid collection or abscess is seen adjacent to the appendectomy site. Physiologic distention of the stomach. Normal small bowel rotation without obstruction or inflammation. Moderate fecal residue within the colon without obstruction. Vascular/Lymphatic: No significant vascular findings are present. No enlarged abdominal or pelvic lymph nodes. Reproductive:  Status post hysterectomy. No adnexal masses. Other: Postsurgical change deep to the umbilicus without abnormal fluid collection, hemorrhage nor abscess. A small amount of intraperitoneal free air is noted, conceivably from prior laparoscopic procedure though the air should have resolved by now. An occult perforation of hollow viscus cannot be entirely excluded. Musculoskeletal: No acute or significant osseous findings. IMPRESSION: 1. Postsurgical induration deep to the umbilicus consistent with recent surgical change. No postoperative abscess, fluid collection or hematoma. 2.  No evidence of bowel obstruction or inflammation. 3. Small amount of free intraperitoneal air is noted conceivably residual from prior laparoscopic procedure though one would expect resolution of the intraperitoneal air from  laparoscopic insufflation by now. Occult perforation of hollow viscus is not entirely excluded though there are no secondary signs of free intraperitoneal fluid or abscess identified. Suggest short-term interval follow-up to assure resolution and stability. These results were called by telephone at the time of interpretation on 12/26/2016 at 2:51 pm to PA [facility] , who verbally acknowledged these results. 4. Hepatic steatosis. Electronically Signed   By: Tollie Eth M.D.   On: 12/26/2016 14:51    Procedures Procedures (including critical care time)  Medications Ordered in ED Medications  sodium chloride 0.9 % bolus 1,000 mL (0 mLs Intravenous Stopped 12/26/16 1452)  ondansetron (ZOFRAN) injection 4 mg (4 mg Intravenous Given 12/26/16 1257)  iopamidol (ISOVUE-300) 61 % injection 100 mL (100 mLs Intravenous Contrast Given 12/26/16 1416)     Initial Impression / Assessment and Plan / ED Course  I have reviewed the triage vital signs and the nursing notes.  Pertinent labs & imaging results that were available during my care of the patient were reviewed by me and considered in my medical decision making (see chart for details).  Clinical Course as of Dec 26 1525  Mon Dec 26, 2016  1221 Currently declines pain management.  [SJ]  1445 Discussed lab results with patient. She is pain-free at rest. Nausea has resolved. No change in abdominal exam.  [SJ]  1515 Spoke with Marylu Lund, nurse for Dr. Johna Sheriff who is in the OR operating. After relaying my report, Dr. Johna Sheriff states he will sent someone from their team down to see and admit the patient.  [SJ]    Clinical Course User Index [SJ] Joy, Shawn C, PA-C    Patient presents with nausea, vomiting, and diarrhea.  Rebound tenderness on exam. Patient is nontoxic appearing, afebrile, not tachycardic, not tachypneic, not hypotensive, maintains excellent SPO2 on room air, and is in no apparent distress.  Small amount of intraperitoneal free air  questionable noted on CT, but more than expected.  General surgery to evaluate and admit.  Findings and plan of care discussed with Raeford Razor, MD.   Vitals:   12/26/16 1100 12/26/16 1308  BP: 138/90 118/75  Pulse: 86 81  Resp: 16 16  Temp: 98.4 F (36.9 C)   TempSrc: Oral   SpO2: 97% 100%  Weight: 116.6 kg (257 lb)   Height: 5\' 6"  (1.676 m)      Final Clinical Impressions(s) / ED Diagnoses   Final diagnoses:  Nausea vomiting and diarrhea  Right lower quadrant abdominal pain    ED Discharge Orders    None       Concepcion Living 12/26/16 1529    Raeford Razor, MD 12/27/16 [phone]

## 2016-12-26 NOTE — ED Notes (Signed)
Misha, AC at bedside. Report given and is transporting patient to floor.

## 2016-12-26 NOTE — ED Triage Notes (Addendum)
Patient c/o mid abdominal pain , nausea, and vomiting x 5 days. Patient denies any fever. Patient states she had an appendectomy at the beginning of November. Patient added that she has right groin pain when she walks up steps.

## 2016-12-27 ENCOUNTER — Other Ambulatory Visit: Payer: Self-pay

## 2016-12-27 LAB — BASIC METABOLIC PANEL
ANION GAP: 7 (ref 5–15)
BUN: 10 mg/dL (ref 6–20)
CALCIUM: 8.1 mg/dL — AB (ref 8.9–10.3)
CO2: 26 mmol/L (ref 22–32)
Chloride: 106 mmol/L (ref 101–111)
Creatinine, Ser: 0.77 mg/dL (ref 0.44–1.00)
GFR calc Af Amer: >60 mL/min (ref >60)
GFR calc non Af Amer: >60 mL/min (ref >60)
GLUCOSE: 89 mg/dL (ref 65–99)
POTASSIUM: 3.6 mmol/L (ref 3.5–5.1)
SODIUM: 139 mmol/L (ref 135–145)

## 2016-12-27 LAB — CBC
HCT: 37 % (ref 36.0–46.0)
Hemoglobin: 11.9 g/dL — ABNORMAL LOW (ref 12.0–15.0)
MCH: 26 pg (ref 26.0–34.0)
MCHC: 32.2 g/dL (ref 30.0–36.0)
MCV: 80.8 fL (ref 78.0–100.0)
PLATELETS: 328 10*3/uL (ref 150–400)
RBC: 4.58 MIL/uL (ref 3.87–5.11)
RDW: 13.4 % (ref 11.5–15.5)
WBC: 6.8 10*3/uL (ref 4.0–10.5)

## 2016-12-27 MED ORDER — MAGNESIUM CITRATE PO SOLN
1.0000 | Freq: Once | ORAL | Status: AC
  Administered 2016-12-27: 1 via ORAL
  Filled 2016-12-27: qty 296

## 2016-12-27 MED ORDER — POLYETHYLENE GLYCOL 3350 17 G PO PACK
17.0000 g | PACK | Freq: Every day | ORAL | Status: DC
  Administered 2016-12-27: 17 g via ORAL
  Filled 2016-12-27 (×2): qty 1

## 2016-12-27 NOTE — Progress Notes (Signed)
Pt had large BM @ 1915. Stool collected and sent to lab for C-Diff testing.  At 1921 Lab called and stated that stool was too formed for C-Diff testing and that another more loose sample would need to be sent when pt next had a BM.

## 2016-12-27 NOTE — Progress Notes (Signed)
Patient ID: Dawn [name], female   DOB: May 11, 1980, 36 y.o.   MRN: [id number]  [facility]     Subjective: CC-  Feels about the same as yesterday. Continues to have some RLQ pain. Tolerating clears. She has had no n/v since yesterday morning. Passing flatus, no BM since admission. Prior to admission she was having some loose stool and some soft BMs; states that she was having bowel movements but they were all small.   Objective: Vital signs in last 24 hours: Temp:  [98.2 F (36.8 C)-98.8 F (37.1 C)] 98.2 F (36.8 C) (11/20 0601) Pulse Rate:  [72-93] 76 (11/20 0601) Resp:  [16-18] 18 (11/20 0601) BP: (111-138)/(65-90) 124/80 (11/20 0601) SpO2:  [96 %-100 %] 98 % (11/20 0601) Weight:  [257 lb (116.6 kg)] 257 lb (116.6 kg) (11/19 1100) Last BM Date: 12/25/16  Intake/Output from previous day: 11/19 0701 - 11/20 0700 In: 2804.2 [I.V.:1804.2; IV Piggyback:1000] Out: 900 [Urine:900] Intake/Output this shift: No intake/output data recorded.  PE: Gen:  Alert, NAD, pleasant HEENT: EOM's intact, pupils equal and round Card:  RRR, no M/G/R heard Pulm:  effort normal Abd: multiple well healing lap incisions cdi with no erythema or drainage. Soft, mild distension, +BS in all 4 quadrants, no masses, hernias, or organomegaly. TTP RLQ with no rebound or guarding Psych: A&Ox3  Skin: no rashes noted, warm and dry  Lab Results:  Recent Labs    12/26/16 1142 12/27/16 0517  WBC 6.9 6.8  HGB 12.4 11.9*  HCT 38.6 37.0  PLT 345 328   BMET Recent Labs    12/26/16 1142 12/27/16 0517  NA 140 139  K 3.5 3.6  CL 105 106  CO2 28 26  GLUCOSE 100* 89  BUN 13 10  CREATININE 0.93 0.77  CALCIUM 8.4* 8.1*   PT/INR No results for input(s): LABPROT, INR in the last 72 hours. CMP     Component Value Date/Time   NA 139 12/27/2016 0517   NA 140 11/08/2012 1441   K 3.6 12/27/2016 0517   CL 106 12/27/2016 0517   CO2 26 12/27/2016 0517   GLUCOSE 89 12/27/2016 0517    BUN 10 12/27/2016 0517   BUN 11 11/08/2012 1441   CREATININE 0.77 12/27/2016 0517   CREATININE 0.77 01/05/2016 0834   CALCIUM 8.1 (L) 12/27/2016 0517   PROT 7.1 12/26/2016 1142   PROT 7.0 11/08/2012 1441   ALBUMIN 3.7 12/26/2016 1142   ALBUMIN 4.4 11/08/2012 1441   AST 20 12/26/2016 1142   ALT 24 12/26/2016 1142   ALKPHOS 37 (L) 12/26/2016 1142   BILITOT 0.7 12/26/2016 1142   GFRNONAA >60 12/27/2016 0517   GFRAA >60 12/27/2016 0517   Lipase     Component Value Date/Time   LIPASE 26 12/26/2016 1142       Studies/Results: Ct Abdomen Pelvis W Contrast  Result Date: 12/26/2016 CLINICAL DATA:  Nausea since 12/19/2016 with incisional pain status post appendectomy on 12/13/2016. Diarrhea. EXAM: CT ABDOMEN AND PELVIS WITH CONTRAST TECHNIQUE: Multidetector CT imaging of the abdomen and pelvis was performed using the standard protocol following bolus administration of intravenous contrast. CONTRAST:  100 cc ISOVUE-300 IOPAMIDOL (ISOVUE-300) INJECTION 61% COMPARISON:  None. FINDINGS: Lower chest: Normal size cardiac chambers. No pericardial effusion. Clear lung bases. Hepatobiliary: Hepatic steatosis. No biliary dilatation. No hepatic mass. Nondistended gallbladder. No gallstones. Pancreas: Normal Spleen: Normal Adrenals/Urinary Tract: Normal bilateral adrenal glands. Symmetric cortical enhancement of both kidneys. No obstructive uropathy or nephrolithiasis. Nondistended urinary bladder.  Stomach/Bowel: Status post appendectomy. No abnormal fluid collection or abscess is seen adjacent to the appendectomy site. Physiologic distention of the stomach. Normal small bowel rotation without obstruction or inflammation. Moderate fecal residue within the colon without obstruction. Vascular/Lymphatic: No significant vascular findings are present. No enlarged abdominal or pelvic lymph nodes. Reproductive: Status post hysterectomy. No adnexal masses. Other: Postsurgical change deep to the umbilicus without  abnormal fluid collection, hemorrhage nor abscess. A small amount of intraperitoneal free air is noted, conceivably from prior laparoscopic procedure though the air should have resolved by now. An occult perforation of hollow viscus cannot be entirely excluded. Musculoskeletal: No acute or significant osseous findings. IMPRESSION: 1. Postsurgical induration deep to the umbilicus consistent with recent surgical change. No postoperative abscess, fluid collection or hematoma. 2.  No evidence of bowel obstruction or inflammation. 3. Small amount of free intraperitoneal air is noted conceivably residual from prior laparoscopic procedure though one would expect resolution of the intraperitoneal air from laparoscopic insufflation by now. Occult perforation of hollow viscus is not entirely excluded though there are no secondary signs of free intraperitoneal fluid or abscess identified. Suggest short-term interval follow-up to assure resolution and stability. These results were called by telephone at the time of interpretation on 12/26/2016 at 2:51 pm to [facility] , who verbally acknowledged these results. 4. Hepatic steatosis. Electronically Signed   By: Tollie Ethavid  Kwon M.D.   On: 12/26/2016 14:51    Anti-infectives: Anti-infectives (From admission, onward)   None       Assessment/Plan Abdominal pain, nausea, vomiting, diarrhea S/p laparoscopic appendectomy 11/6 Dr. Carolynne Edouardoth - 2 weeks out from surgery - has had intermittent abdominal pain, nausea, vomiting, distension, and diarrhea since surgery, but has been worse x2-3 days - CT scan shows small amount of free intraperitoneal air, no secondary signs of free intraperitoneal fluid or abscess identified - WBC 6.8, afebrile, VSS  ID - none VTE - SCDs, lovenox FEN - decrease IVF, full liquids Foley - none Follow up - TBD  Plan - continues to have abdominal pain, but VSS, WBC WNL, and patient without peritonitis. She could be constipated. Will advance to  fulls. Continue ambulating. Add miralax. Continue to monitor.   LOS: 0 days    Franne FortsBrooke A Meuth , [facility] 12/27/2016, 8:47 AM Pager: [phone] Consults: [phone] Mon-Fri 7:00 am-4:30 pm Sat-Sun 7:00 am-11:30 am

## 2016-12-28 LAB — GASTROINTESTINAL PANEL BY PCR, STOOL (REPLACES STOOL CULTURE)
ASTROVIRUS: NOT DETECTED
Adenovirus F40/41: NOT DETECTED
CAMPYLOBACTER SPECIES: NOT DETECTED
CRYPTOSPORIDIUM: NOT DETECTED
CYCLOSPORA CAYETANENSIS: NOT DETECTED
ENTAMOEBA HISTOLYTICA: NOT DETECTED
ENTEROTOXIGENIC E COLI (ETEC): NOT DETECTED
Enteroaggregative E coli (EAEC): NOT DETECTED
Enteropathogenic E coli (EPEC): NOT DETECTED
Giardia lamblia: NOT DETECTED
Norovirus GI/GII: NOT DETECTED
PLESIMONAS SHIGELLOIDES: NOT DETECTED
Rotavirus A: NOT DETECTED
SALMONELLA SPECIES: NOT DETECTED
SAPOVIRUS (I, II, IV, AND V): NOT DETECTED
SHIGA LIKE TOXIN PRODUCING E COLI (STEC): NOT DETECTED
SHIGELLA/ENTEROINVASIVE E COLI (EIEC): NOT DETECTED
VIBRIO SPECIES: NOT DETECTED
Vibrio cholerae: NOT DETECTED
Yersinia enterocolitica: NOT DETECTED

## 2016-12-28 NOTE — Discharge Instructions (Signed)
Constipation, Adult °Constipation is when a person: °· Poops (has a bowel movement) fewer times in a week than normal. °· Has a hard time pooping. °· Has poop that is dry, hard, or bigger than normal. ° °Follow these instructions at home: °Eating and drinking ° °· Eat foods that have a lot of fiber, such as: °? Fresh fruits and vegetables. °? Whole grains. °? Beans. °· Eat less of foods that are high in fat, low in fiber, or overly processed, such as: °? French fries. °? Hamburgers. °? Cookies. °? Candy. °? Soda. °· Drink enough fluid to keep your pee (urine) clear or pale yellow. °General instructions °· Exercise regularly or as told by your doctor. °· Go to the restroom when you feel like you need to poop. Do not hold it in. °· Take over-the-counter and prescription medicines only as told by your doctor. These include any fiber supplements. °· Do pelvic floor retraining exercises, such as: °? Doing deep breathing while relaxing your lower belly (abdomen). °? Relaxing your pelvic floor while pooping. °· Watch your condition for any changes. °· Keep all follow-up visits as told by your doctor. This is important. °Contact a doctor if: °· You have pain that gets worse. °· You have a fever. °· You have not pooped for 4 days. °· You throw up (vomit). °· You are not hungry. °· You lose weight. °· You are bleeding from the anus. °· You have thin, pencil-like poop (stool). °Get help right away if: °· You have a fever, and your symptoms suddenly get worse. °· You leak poop or have blood in your poop. °· Your belly feels hard or bigger than normal (is bloated). °· You have very bad belly pain. °· You feel dizzy or you faint. °This information is not intended to replace advice given to you by your health care provider. Make sure you discuss any questions you have with your health care provider. °Document Released: 07/13/2007 Document Revised: 08/14/2015 Document Reviewed: 07/15/2015 °Elsevier Interactive Patient Education ©  2017 [employer] ° °

## 2016-12-28 NOTE — Discharge Summary (Signed)
[facility] Discharge Summary   Patient ID: Dawn SprinklesBetty Giacobbe MRN: [id number] DOB/AGE: 36-Apr-1982 36 y.o.  Admit date: 12/26/2016 Discharge date: 12/28/2016  Admitting Diagnosis: Abdominal pain, nausea, vomiting, diarrhea S/p laparoscopic appendectomy 11/6 Dr. Carolynne Edouardoth  Discharge Diagnosis Constipation  Patient Active Problem List   Diagnosis Date Noted  . Status post laparoscopic appendectomy 12/26/2016  . Abdominal pain 12/12/2016  . Well woman exam 03/15/2016  . Possible exposure to STD 03/15/2016  . Allergic reactions 01/05/2016  . Seasonal and perennial allergic rhinitis 01/05/2016  . Nasal polyps 01/05/2016  . Stroke-like symptoms 05/25/2015  . Cephalalgia   . Essential hypertension   . Breast hypertrophy in female 08/28/2014  . Obesity 04/03/2014  . Back pain 04/03/2014  . Chronic sinusitis 04/03/2014  . Endometriosis determined by laparoscopy 04/03/2014  . Recurrent headache 11/08/2012  . Genital herpes 10/25/2011  . Avitaminosis D 09/05/2011  . Acid reflux 08/05/2011  . Adult BMI 30+ 08/05/2011    Consultants None  Imaging: Ct Abdomen Pelvis W Contrast  Result Date: 12/26/2016 CLINICAL DATA:  Nausea since 12/19/2016 with incisional pain status post appendectomy on 12/13/2016. Diarrhea. EXAM: CT ABDOMEN AND PELVIS WITH CONTRAST TECHNIQUE: Multidetector CT imaging of the abdomen and pelvis was performed using the standard protocol following bolus administration of intravenous contrast. CONTRAST:  100 cc ISOVUE-300 IOPAMIDOL (ISOVUE-300) INJECTION 61% COMPARISON:  None. FINDINGS: Lower chest: Normal size cardiac chambers. No pericardial effusion. Clear lung bases. Hepatobiliary: Hepatic steatosis. No biliary dilatation. No hepatic mass. Nondistended gallbladder. No gallstones. Pancreas: Normal Spleen: Normal Adrenals/Urinary Tract: Normal bilateral adrenal glands. Symmetric cortical enhancement of both kidneys. No obstructive uropathy or nephrolithiasis.  Nondistended urinary bladder. Stomach/Bowel: Status post appendectomy. No abnormal fluid collection or abscess is seen adjacent to the appendectomy site. Physiologic distention of the stomach. Normal small bowel rotation without obstruction or inflammation. Moderate fecal residue within the colon without obstruction. Vascular/Lymphatic: No significant vascular findings are present. No enlarged abdominal or pelvic lymph nodes. Reproductive: Status post hysterectomy. No adnexal masses. Other: Postsurgical change deep to the umbilicus without abnormal fluid collection, hemorrhage nor abscess. A small amount of intraperitoneal free air is noted, conceivably from prior laparoscopic procedure though the air should have resolved by now. An occult perforation of hollow viscus cannot be entirely excluded. Musculoskeletal: No acute or significant osseous findings. IMPRESSION: 1. Postsurgical induration deep to the umbilicus consistent with recent surgical change. No postoperative abscess, fluid collection or hematoma. 2.  No evidence of bowel obstruction or inflammation. 3. Small amount of free intraperitoneal air is noted conceivably residual from prior laparoscopic procedure though one would expect resolution of the intraperitoneal air from laparoscopic insufflation by now. Occult perforation of hollow viscus is not entirely excluded though there are no secondary signs of free intraperitoneal fluid or abscess identified. Suggest short-term interval follow-up to assure resolution and stability. These results were called by telephone at the time of interpretation on 12/26/2016 at 2:51 pm to PA [facility] , who verbally acknowledged these results. 4. Hepatic steatosis. Electronically Signed   By: Tollie Ethavid  Kwon M.D.   On: 12/26/2016 14:51    Procedures None this admission  Hospital Course:  Dawn [name] is a 36yo female 2 weeks s/p laparoscopic appendectomy 11/6 by Dr. Carolynne Edouardoth, [facility] increasing  abdominal pain, nausea, vomiting, and loose stool.  Workup included CT scan which showed small amount of free intraperitoneal air, no secondary signs of free intraperitoneal fluid or abscess identified. WBC WNL, VSS, and no peritonitis on  exam. Patient was admitted for observation.  WBC remained WNL and VSS. She was given magnesium citrate and had multiple bowel movements. The next day her pain improved. On 11/21 the patient was voiding well, tolerating diet, ambulating well, pain well controlled, vital signs stable and felt stable for discharge home.  Patient will follow up in our office in 2 weeks and knows to call with questions or concerns.    I have personally reviewed the patients medication history on the [place] controlled substance database.    Physical Exam: Gen:  Alert, NAD, pleasant HEENT: EOM's intact, pupils equal and round Card:  RRR, no M/G/R heard Pulm:  effort normal Abd: multiple well healing lapincisionscdi with no erythema or drainage. Soft,nontender, nondistended, +BSin all 4 quadrants, no masses, hernias, or organomegaly. Psych: A&Ox3  Skin: no rashes noted, warm and dry   Allergies as of 12/28/2016      Reactions   Claritin [loratadine] Other (See Comments)   Causes hands to shake Tremors   Other Anaphylaxis   Foods:  Skittles, donuts (all).   Some Makeup.        Medication List    STOP taking these medications   HYDROcodone-acetaminophen 5-325 MG tablet Commonly known as:  NORCO/VICODIN   traMADol 50 MG tablet Commonly known as:  ULTRAM     TAKE these medications   EPINEPHrine 0.3 mg/0.3 mL Soaj injection Commonly known as:  EPI-PEN Inject 0.3 mg into the muscle once as needed (severe allergic reaction).   ondansetron 4 MG disintegrating tablet Commonly known as:  ZOFRAN-ODT Take 1 tablet every 4 (four) hours as needed by mouth for nausea/vomiting.        Follow-up Information    [facility] [place] Call on [facility]10/2017.   Specialty:   General Surgery Why:  follow up as scheduled Contact information: [address] [place] [id number] [phone]          Signed: Franne FortsBrooke A Meuth, [facility] 12/28/2016, 8:32 AM Pager: [phone] Consults: [phone] Mon-Fri 7:00 am-4:30 pm Sat-Sun 7:00 am-11:30 am

## 2016-12-28 NOTE — Progress Notes (Signed)
Patient is being discharged home. Discharge instructions were given to patient and family. She declined wheelchair for transportation.

## 2017-01-20 ENCOUNTER — Other Ambulatory Visit: Payer: Self-pay | Admitting: General Surgery

## 2017-01-20 ENCOUNTER — Ambulatory Visit
Admission: RE | Admit: 2017-01-20 | Discharge: 2017-01-20 | Disposition: A | Discharge status: Home / Self Care | Payer: 59 | Source: Ambulatory Visit | Attending: General Surgery | Admitting: General Surgery

## 2017-01-20 DIAGNOSIS — K3580 Unspecified acute appendicitis: Secondary | ICD-10-CM

## 2017-01-20 MED ORDER — IOPAMIDOL (ISOVUE-300) INJECTION 61%
100.0000 mL | Freq: Once | INTRAVENOUS | Status: AC | PRN
  Administered 2017-01-20: 125 mL via INTRAVENOUS

## 2017-02-07 HISTORY — PX: CARPAL TUNNEL RELEASE: SHX101

## 2017-11-01 DIAGNOSIS — H3500 Unspecified background retinopathy: Secondary | ICD-10-CM | POA: Insufficient documentation

## 2017-11-01 DIAGNOSIS — H319 Unspecified disorder of choroid: Secondary | ICD-10-CM | POA: Insufficient documentation

## 2018-09-25 ENCOUNTER — Telehealth: Payer: Self-pay | Admitting: Family Medicine

## 2018-09-25 NOTE — Telephone Encounter (Signed)
Patient called in stating that she spoke with someone earlier about scheduling an about. Patient was instructed that she is a [facility] patient and would have to reach out to them about getting scheduled. Patient stated that once she called the [facility] office they did not have any available appointments for [facility] in September of November at their office and patient was instructed to give our office a call. Spoke with Lorenso Courier and she stated that the patient would have to get scheduled at [facility] and not our office. This information has given to the patient and the patient verbalized understanding.

## 2018-10-09 ENCOUNTER — Ambulatory Visit (INDEPENDENT_AMBULATORY_CARE_PROVIDER_SITE_OTHER): Payer: 59 | Admitting: Obstetrics & Gynecology

## 2018-10-09 ENCOUNTER — Other Ambulatory Visit: Payer: Self-pay

## 2018-10-09 ENCOUNTER — Encounter: Payer: Self-pay | Admitting: Obstetrics & Gynecology

## 2018-10-09 VITALS — BP 123/84 | HR 74 | Ht 66.0 in | Wt 259.0 lb

## 2018-10-09 DIAGNOSIS — N760 Acute vaginitis: Secondary | ICD-10-CM

## 2018-10-09 DIAGNOSIS — Z113 Encounter for screening for infections with a predominantly sexual mode of transmission: Secondary | ICD-10-CM

## 2018-10-09 DIAGNOSIS — Z01419 Encounter for gynecological examination (general) (routine) without abnormal findings: Secondary | ICD-10-CM | POA: Diagnosis not present

## 2018-10-09 DIAGNOSIS — N898 Other specified noninflammatory disorders of vagina: Secondary | ICD-10-CM

## 2018-10-09 DIAGNOSIS — B9689 Other specified bacterial agents as the cause of diseases classified elsewhere: Secondary | ICD-10-CM

## 2018-10-09 DIAGNOSIS — L918 Other hypertrophic disorders of the skin: Secondary | ICD-10-CM

## 2018-10-09 DIAGNOSIS — Z23 Encounter for immunization: Secondary | ICD-10-CM

## 2018-10-09 MED ORDER — METRONIDAZOLE 500 MG PO TABS: 500.0000 mg | ORAL_TABLET | Freq: Two times a day (BID) | ORAL | 0 refills | Status: DC

## 2018-10-09 MED ORDER — FLUCONAZOLE 150 MG PO TABS: 150.0000 mg | ORAL_TABLET | Freq: Once | ORAL | 3 refills | Status: AC

## 2018-10-09 NOTE — Progress Notes (Signed)
Since her hysterectomy in 2016 she has been getting recurrent yeast and BV infections    Would like STI screen and flu shot

## 2018-10-09 NOTE — Progress Notes (Signed)
Subjective:    Dawn [name] is a 38 y.o. single P70 (79 yo daughter)  who presents for an annual exam. The patient has no complaints today except for recurrent BV. The patient is sexually active. GYN screening history: last pap: was normal. The patient wears seatbelts: yes. The patient participates in regular exercise: yes. Has the patient ever been transfused or tattooed?: yes. The patient reports that there is not domestic violence in her life.   Menstrual History: OB History    Gravida  3   Para  1   Term  1   Preterm      AB  2   Living  1     SAB  2   TAB      Ectopic      Multiple      Live Births  1           Menarche age: 32 Patient's last menstrual period was 06/25/2014 (approximate).    The following portions of the patient's history were reviewed and updated as appropriate: allergies, current medications, past family history, past medical history, past social history, past surgical history and problem list.  Review of Systems Pertinent items are noted in HPI.   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   Objective:    BP 123/84   Pulse 74   Ht 5\' 6"  (1.676 m)   Wt 259 lb (117.5 kg)   LMP 06/25/2014 (Approximate)   BMI 41.80 kg/m   General Appearance:    Alert, cooperative, no distress, appears stated age  Head:    Normocephalic, without obvious abnormality, atraumatic  Eyes:    PERRL, conjunctiva/corneas clear, EOM's intact, fundi    benign, both eyes  Ears:    Normal TM's and external ear canals, both ears  Nose:   Nares normal, septum midline, mucosa normal, no drainage    or sinus tenderness  Throat:   Lips, mucosa, and tongue normal; teeth and gums normal  Neck:   Supple, symmetrical, trachea midline, no adenopathy;    thyroid:  no enlargement/tenderness/nodules; no carotid   bruit or  JVD  Back:     Symmetric, no curvature, ROM normal, no CVA tenderness  Lungs:     Clear to auscultation bilaterally, respirations unlabored  Chest Wall:    No tenderness or deformity   Heart:    Regular rate and rhythm, S1 and S2 normal, no murmur, rub   or gallop  Breast Exam:    No tenderness, masses, or nipple abnormality  Abdomen:     Soft, non-tender, bowel sounds active all four quadrants,    no masses, no organomegaly  Genitalia:    Normal female without lesion, discharge or tenderness, large skin tag. She wanted this removed. I prepped the area with betadine and sprayed Huricaine spray. I then injected 2 cc of 1% lidocaine. I then removed the skin tag. I used silver nitrate and Monsel's solution to achieve hemostasis. She tolerated the procedure well. Bimanual exam revealed no masses or tenderness     Extremities:   Extremities normal, atraumatic, no cyanosis or edema  Pulses:   2+ and symmetric all extremities  Skin:   Skin color, texture, turgor normal, no rashes or lesions  Lymph nodes:   Cervical, supraclavicular, and axillary nodes normal  Neurologic:   CNII-XII intact,  normal strength, sensation and reflexes    throughout  .    Assessment:    Healthy female exam.   Skin tag   Plan:    Healthy lifestyle choices discussed She got fasting labs done last week with her primary care provider Flu and Gardasil vaccines today Come back for next 2 Gardasil vaccines

## 2018-10-11 ENCOUNTER — Other Ambulatory Visit: Payer: Self-pay | Admitting: Obstetrics & Gynecology

## 2018-10-11 LAB — CERVICOVAGINAL ANCILLARY ONLY
Bacterial vaginitis: POSITIVE — AB
Candida vaginitis: NEGATIVE
Chlamydia: NEGATIVE
Neisseria Gonorrhea: NEGATIVE
Trichomonas: NEGATIVE

## 2018-10-11 NOTE — Progress Notes (Signed)
Wet prep + for bv Flagyl was prescribed 10-09-18.

## 2018-12-05 ENCOUNTER — Other Ambulatory Visit: Payer: Self-pay | Admitting: *Deleted

## 2018-12-05 MED ORDER — METRONIDAZOLE 500 MG PO TABS: 500.0000 mg | ORAL_TABLET | Freq: Two times a day (BID) | ORAL | 0 refills | Status: DC

## 2019-03-18 ENCOUNTER — Ambulatory Visit (INDEPENDENT_AMBULATORY_CARE_PROVIDER_SITE_OTHER): Payer: 59 | Admitting: *Deleted

## 2019-03-18 ENCOUNTER — Other Ambulatory Visit (HOSPITAL_COMMUNITY)
Admission: RE | Admit: 2019-03-18 | Discharge: 2019-03-18 | Disposition: A | Discharge status: Home / Self Care | Payer: 59 | Source: Ambulatory Visit | Attending: Obstetrics and Gynecology | Admitting: Obstetrics and Gynecology

## 2019-03-18 ENCOUNTER — Other Ambulatory Visit: Payer: Self-pay

## 2019-03-18 VITALS — BP 140/82 | HR 97

## 2019-03-18 DIAGNOSIS — N898 Other specified noninflammatory disorders of vagina: Secondary | ICD-10-CM

## 2019-03-18 NOTE — Progress Notes (Signed)
Patient seen and assessed by nursing staff during this encounter. I have reviewed the chart and agree with the documentation and plan.  Tabor Bing, MD 03/18/2019 2:33 PM

## 2019-03-18 NOTE — Progress Notes (Signed)
SUBJECTIVE:  39 y.o. female complains of vaginal irritation for few day(s). Denies abnormal vaginal bleeding or significant pelvic pain or fever. No UTI symptoms. Denies history of known exposure to STD.  Patient's last menstrual period was 06/25/2014 (approximate).  OBJECTIVE:  She appears well, afebrile. Urine dipstick: not done.  ASSESSMENT:  Vaginal Irritation    PLAN:  GC, chlamydia, trichomonas, BVAG, CVAG probe sent to lab. Treatment: To be determined once lab results are received. Pt states she seems to have a reaction to oral flagyl, told pt we could send in the cream if she has BV. ROV prn if symptoms persist or worsen.

## 2019-03-20 LAB — CERVICOVAGINAL ANCILLARY ONLY
Bacterial Vaginitis (gardnerella): POSITIVE — AB
Candida Glabrata: NEGATIVE
Candida Vaginitis: NEGATIVE
Chlamydia: NEGATIVE
Comment: NEGATIVE
Comment: NEGATIVE
Comment: NEGATIVE
Comment: NEGATIVE
Comment: NEGATIVE
Comment: NORMAL
Neisseria Gonorrhea: NEGATIVE
Trichomonas: NEGATIVE

## 2019-03-21 MED ORDER — METRONIDAZOLE 500 MG PO TABS: 500.0000 mg | ORAL_TABLET | Freq: Two times a day (BID) | ORAL | 2 refills | Status: DC

## 2019-03-21 NOTE — Addendum Note (Signed)
Addended by: [place] Bing on: 03/21/2019 08:56 PM   Modules accepted: Orders

## 2019-03-22 ENCOUNTER — Telehealth: Payer: Self-pay | Admitting: *Deleted

## 2019-03-22 MED ORDER — METRONIDAZOLE 0.75 % VA GEL: 1.0000 | Freq: Every day | VAGINAL | 1 refills | Status: DC

## 2019-03-22 NOTE — Telephone Encounter (Signed)
Left message for pt to call back.also sent in cream RX for BV

## 2019-03-22 NOTE — Telephone Encounter (Signed)
-----   Message from [place] Bing, MD sent at 03/21/2019  8:57 PM EST ----- Can you let her know that she has bv and flagyl was sent in?  thanks

## 2019-03-22 NOTE — Telephone Encounter (Signed)
-----   Message from Charlie Pickens, MD sent at 03/21/2019  8:57 PM EST ----- Can you let her know that she has bv and flagyl was sent in?  thanks 

## 2019-04-19 IMAGING — CT CT ABD-PELV W/ CM
1 of 2 series · 15 of 32 positions shown, 19 images · IV contrast (iopamidol)
Comparison: None.

CLINICAL DATA: Appendicitis without peritonitis. Laparoscopic
surgery December 13, 2016 with persistent pain. Nausea and vomiting.
Diarrhea.

EXAM:
CT ABDOMEN AND PELVIS WITH CONTRAST
TECHNIQUE: Multidetector CT imaging of the abdomen and pelvis was performed
using the standard protocol following bolus administration of
intravenous contrast.
CONTRAST:  125mL AS3AQ1-NHH IOPAMIDOL (AS3AQ1-NHH) INJECTION 61%

[Series 2: abd/pelvis w/cm · axial · 0.96mm/px · z∈[+400,+850]mm · 15 of 98 slices shown, 19 images]
[im 4/98  soft-tissue]
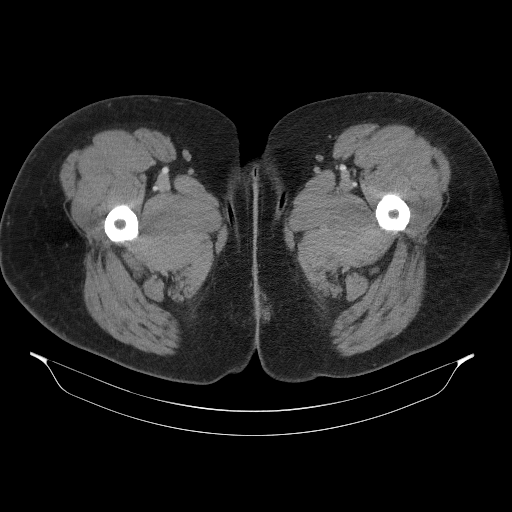
[im 4/98  bone]
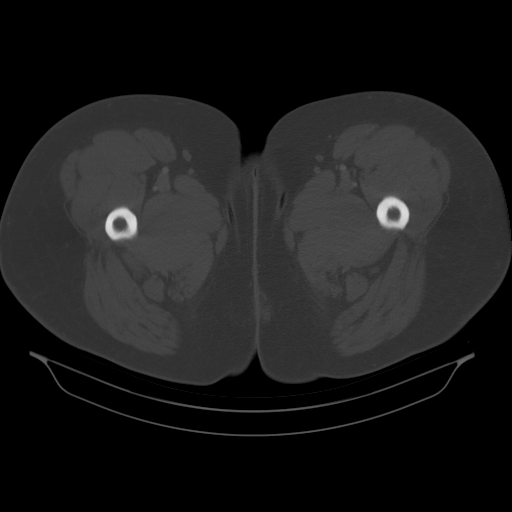
[im 12/98  soft-tissue]
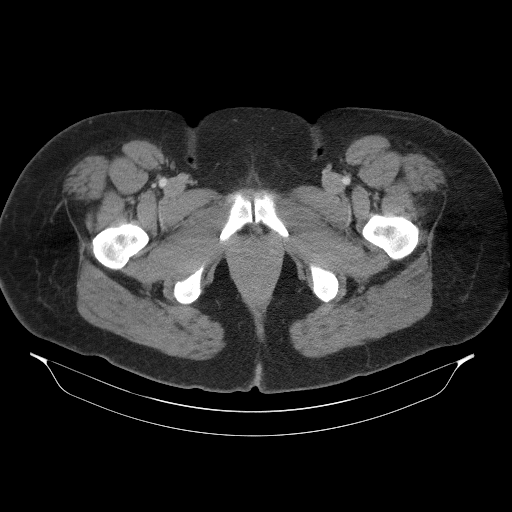
[im 20/98  soft-tissue]
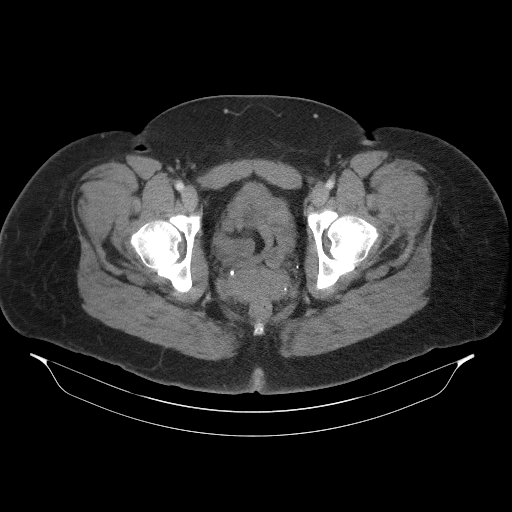
[im 28/98  soft-tissue]
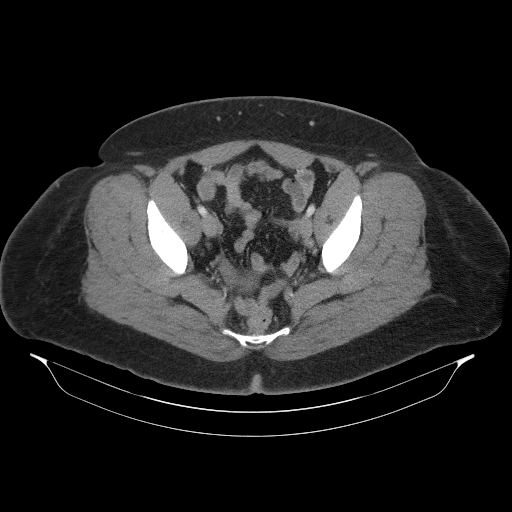
[im 35/98  soft-tissue]
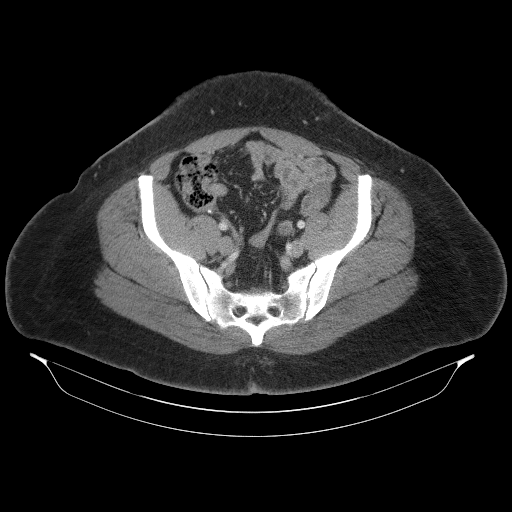
[im 43/98  soft-tissue]
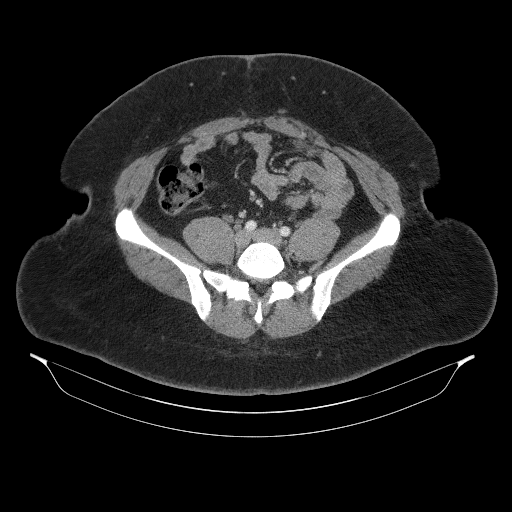
[im 51/98  soft-tissue]
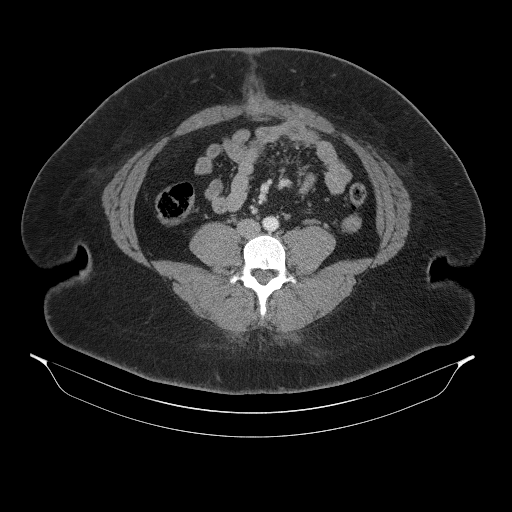
[im 55/98  soft-tissue]
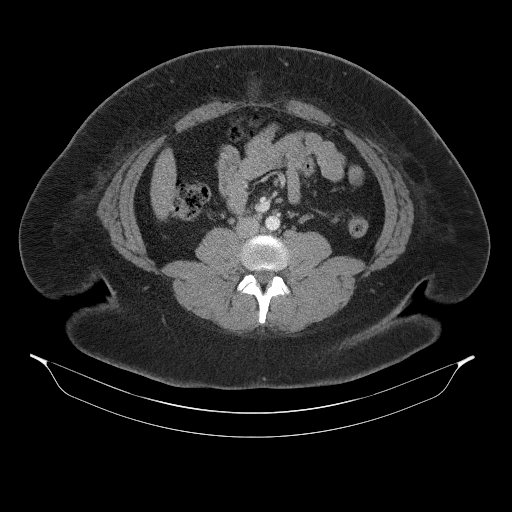
[im 63/98  soft-tissue]
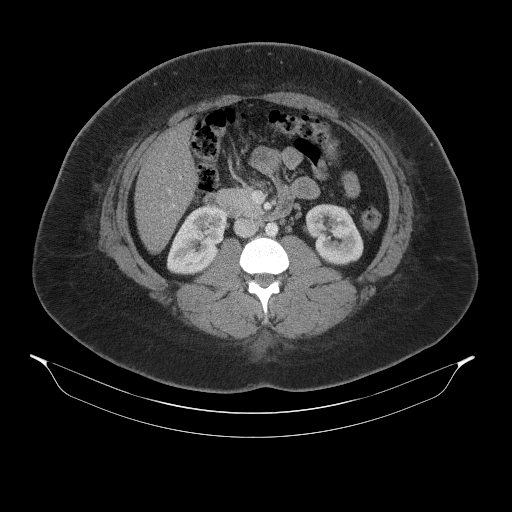
[im 63/98  bone]
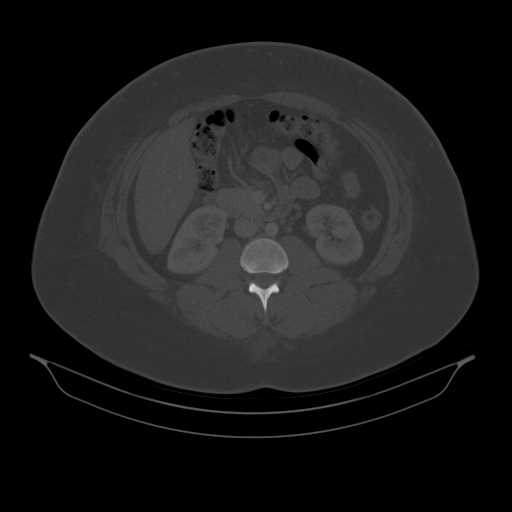
[im 70/98  soft-tissue]
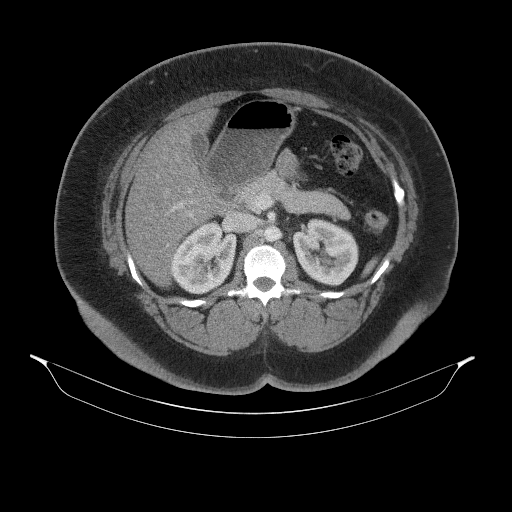
[im 78/98  soft-tissue]
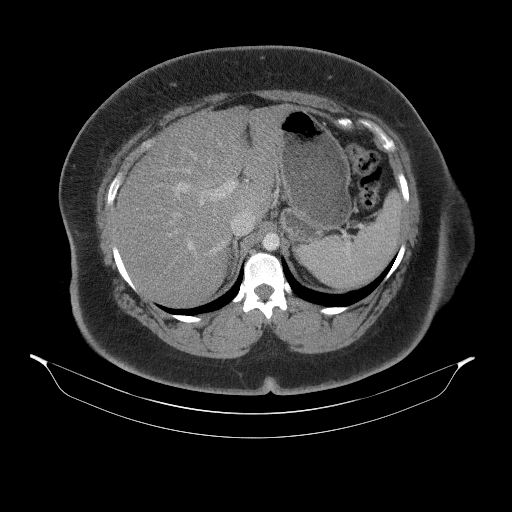
[im 82/98  lung]
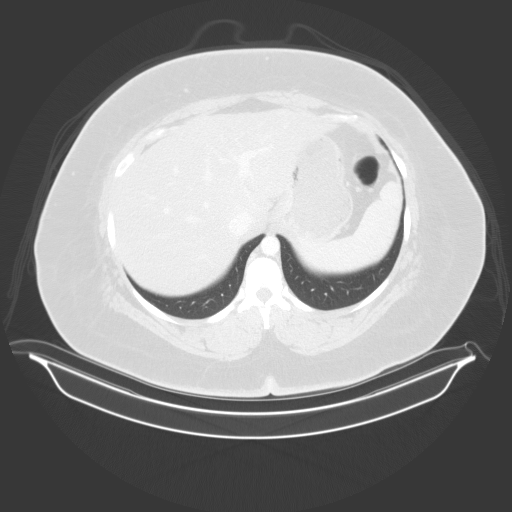
[im 86/98  soft-tissue]
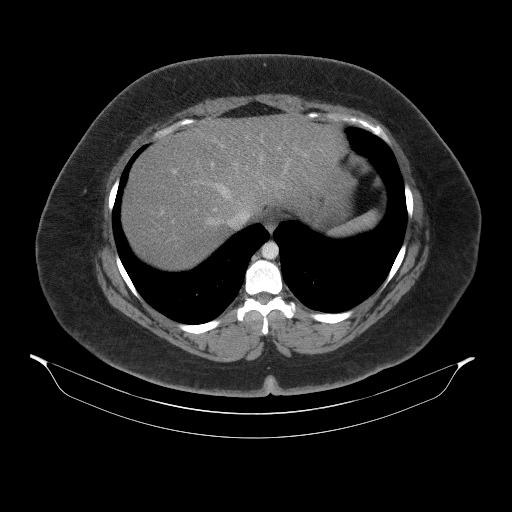
[im 86/98  lung]
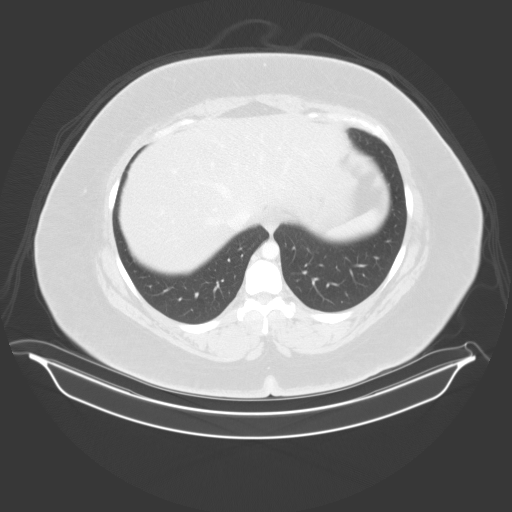
[im 90/98  lung]
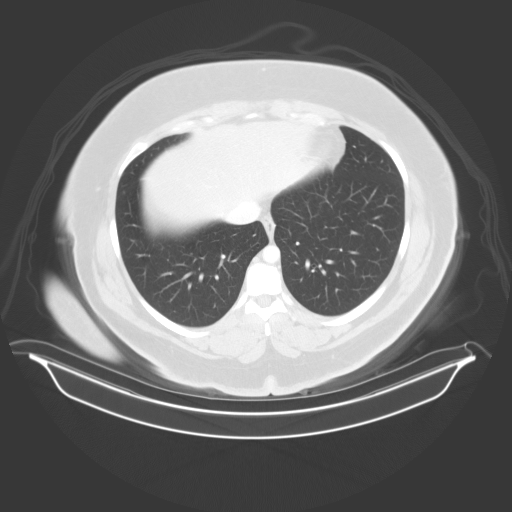
[im 94/98  soft-tissue]
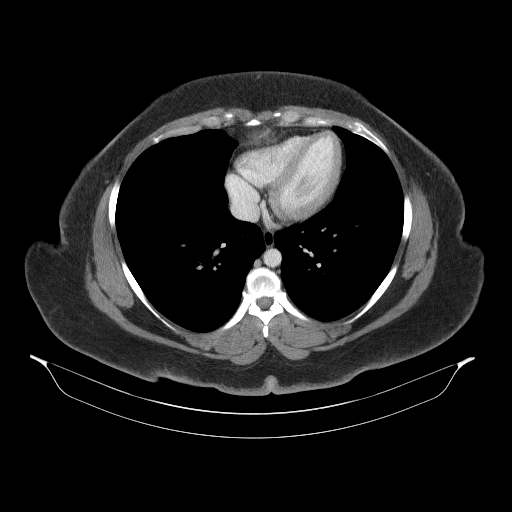
[im 94/98  lung]
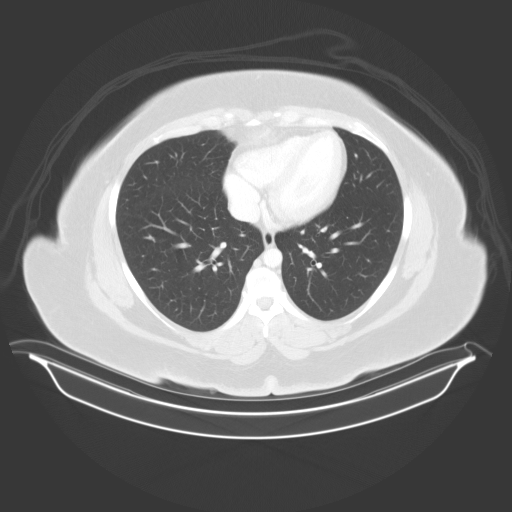

[15 of 32 positions shown; findings below may reference images not displayed]

FINDINGS: Lower chest:  Negative

Hepatobiliary: Generalized hepatic steatosis.No evidence of biliary
obstruction or stone.

Pancreas: Unremarkable.

Spleen: Unremarkable.

Adrenals/Urinary Tract: Negative adrenals. No hydronephrosis or
stone. Unremarkable bladder.

Stomach/Bowel: No obstruction. Appendectomy without collection or
inflammation. No bowel obstruction or abnormal stool retention.

Vascular/Lymphatic: No acute vascular abnormality. No mass or
adenopathy.

Reproductive:3.2 cm right ovarian cyst which is follicular based on
development since recent prior. Hysterectomy. The left ovary is in
the left lower quadrant.

Other: No ascites or pneumoperitoneum. Scar-like appearance at the
umbilicus in keeping with history of prior laparoscopic surgery. No
abdominal wall fluid collection

Musculoskeletal: Negative
IMPRESSION: 1. No complicating features after recent appendectomy. No specific
explanation for symptoms.
2. Hepatic steatosis.

## 2019-05-22 ENCOUNTER — Telehealth: Payer: Self-pay | Admitting: Advanced Practice Midwife

## 2019-05-22 ENCOUNTER — Ambulatory Visit (INDEPENDENT_AMBULATORY_CARE_PROVIDER_SITE_OTHER): Payer: 59 | Admitting: Advanced Practice Midwife

## 2019-05-22 ENCOUNTER — Other Ambulatory Visit: Payer: Self-pay

## 2019-05-22 ENCOUNTER — Encounter: Payer: Self-pay | Admitting: Advanced Practice Midwife

## 2019-05-22 VITALS — BP 129/85 | HR 94 | Wt 251.0 lb

## 2019-05-22 DIAGNOSIS — N898 Other specified noninflammatory disorders of vagina: Secondary | ICD-10-CM | POA: Diagnosis not present

## 2019-05-22 MED ORDER — ACIDOPHILUS LACTOBACILLUS PO CAPS: 1.0000 | ORAL_CAPSULE | Freq: Every day | ORAL | 3 refills | Status: DC

## 2019-05-22 MED ORDER — METRONIDAZOLE 500 MG PO TABS: 500.0000 mg | ORAL_TABLET | Freq: Two times a day (BID) | ORAL | 2 refills | Status: DC

## 2019-05-22 MED ORDER — FLUCONAZOLE 150 MG PO TABS: 150.0000 mg | ORAL_TABLET | Freq: Once | ORAL | 0 refills | Status: AC

## 2019-05-22 NOTE — Progress Notes (Addendum)
GYNECOLOGY PROBLEM CARE ENCOUNTER NOTE  History:     Dawn [name] is a 39 y.o. [id number] female here for evaluation of recurrent vaginal irritation and Bacterial Vaginosis.  She is also experiencing vaginal dryness and irritation during intercourse. Denies abnormal vaginal bleeding, discharge, pelvic pain, or other gynecologic concerns    Patient is currently managing her BV with boric acid suppositories. She is also using a personal cleansing solution from [facility] as well as waxing her pubic hair. Penetrative sex, female partner, monogamous since 2017.  Gynecologic History Patient's last menstrual period was 06/25/2014 (approximate).  Contraception: Hysterectomy Last Pap: 03/2016. Results were: normal with positive HPV  Obstetric History OB History  Gravida Para Term Preterm AB Living  3 1 1   2 1   SAB TAB Ectopic Multiple Live Births  2       1    # Outcome Date GA Lbr Len/2nd Weight Sex Delivery Anes PTL Lv  3 SAB 2014     SAB     2 SAB 2013     SAB     1 Term 03/05/02 [redacted]w[redacted]d  7 lb (3.175 kg) F Vag-Spont EPI N LIV    Past Medical History:  Diagnosis Date  . GERD (gastroesophageal reflux disease)   . Migraine dx'd 05/25/2015  . PONV (postoperative nausea and vomiting)   . Vaginal delivery 2004    Past Surgical History:  Procedure Laterality Date  . APPENDECTOMY N/A 12/13/2016   Procedure: POSSIBLE APPENDECTOMY;  Surgeon: Jovita Kussmaul, MD;  Location: [place];  Service: General;  Laterality: N/A;  . BREAST REDUCTION SURGERY Bilateral 08/28/2014   Procedure: BILATERAL BREAST  REDUCTION  ;  Surgeon: Crissie Reese, MD;  Location: [place];  Service: Plastics;  Laterality: Bilateral;  . LAPAROSCOPY N/A 02/18/2013   Procedure: LAPAROSCOPY OPERATIVE;  Surgeon: Olga Millers, MD;  Location: [facility];  Service: Gynecology;  Laterality: N/A;  YAG LASER  . LAPAROTOMY N/A 12/13/2016   Procedure: DIAGNOSTIC LAPAROTOMY;  Surgeon: Jovita Kussmaul, MD;  Location: [place];  Service: General;   Laterality: N/A;  . REDUCTION MAMMAPLASTY  08/28/2014  . TONSILLECTOMY  2008  . VAGINAL HYSTERECTOMY  06-26-14  . YAG LASER APPLICATION N/A 5/78/4696   Procedure: YAG LASER APPLICATION;  Surgeon: Olga Millers, MD;  Location: [facility];  Service: Gynecology;  Laterality: N/A;    Current Outpatient Medications on File Prior to Visit  Medication Sig Dispense Refill  . EPINEPHrine 0.3 mg/0.3 mL IJ SOAJ injection Inject 0.3 mg into the muscle once as needed (severe allergic reaction).     . valACYclovir (VALTREX) 500 MG tablet Take 500 mg by mouth 2 (two) times daily.    . metroNIDAZOLE (METROGEL) 0.75 % vaginal gel Place 1 Applicatorful vaginally at bedtime. Apply one applicatorful to vagina at bedtime for 5 days 70 g 1   No current facility-administered medications on file prior to visit.    Allergies  Allergen Reactions  . Claritin [Loratadine] Other (See Comments)    Causes hands to shake Tremors  . Other Anaphylaxis    Foods:  Skittles, donuts (all).   Some Makeup.    . Flagyl [Metronidazole] Rash    On upper lip    Social History:  reports that she has never smoked. She has never used smokeless tobacco. She reports that she does not drink alcohol or use drugs.  Family History  Problem Relation Age of Onset  . Hyperlipidemia Mother   . Hypertension Mother   .  Allergic rhinitis Mother   . Asthma Sister   . Allergic rhinitis Sister   . Allergic rhinitis Brother   . Asthma Paternal Uncle     The following portions of the patient's history were reviewed and updated as appropriate: allergies, current medications, past family history, past medical history, past social history, past surgical history and problem list.  Review of Systems Pertinent items noted in HPI and remainder of comprehensive ROS otherwise negative.  Physical Exam:  BP 129/85   Pulse 94   Wt 251 lb (113.9 kg)   LMP 06/25/2014 (Approximate)   BMI 40.51 kg/m  CONSTITUTIONAL: Well-developed, well-nourished  female in no acute distress.  EYES: Conjunctivae and EOM are normal. Pupils are equal, round, and reactive to light. No scleral icterus.  NECK: Normal range of motion, supple, no masses.  Normal thyroid.  SKIN: Skin is warm and dry. No rash noted. Not diaphoretic. No erythema. No pallor. MUSCULOSKELETAL: Normal range of motion. No tenderness.  No cyanosis, clubbing, or edema.  2+ distal pulses. NEUROLOGIC: Alert and oriented to person, place, and time. Normal reflexes, muscle tone coordination.  PSYCHIATRIC: Normal mood and affect. Normal behavior. Normal judgment and thought content. CARDIOVASCULAR: Normal heart rate noted, regular rhythm RESPIRATORY: No problems with respiration noted. PERINEUM: Manual retraction of labia, speculum not used today. Normal appearing external genitalia and urethral meatus; Introitus and vaginal mucosa appear irritated.  Thick white discharge noted on labia and at introitus.  Performed in the presence of a chaperone.   Assessment and Plan:    1. Vaginal irritation --Advised discontinuation of Honeypot cleanser due to vinegar as one of first four ingredients --Advised discontinuation of intercourse until treatment complete and symptoms resolve --D/C waxing of pubic hair due to close proximity of wax to introitus and visibly irritated skin --Consider daily Lactobacillus supplement --Cervicovaginal ancillary only( [place])  Routine preventative health maintenance measures emphasized. Please refer to After Visit Summary for other counseling recommendations.   F/U:  Pap due 03/2017. Patient to schedule at her earliest convenience  Total visit time 15 minutes. Greater than 50% of visit spent in counseling and coordination of care  Clayton Bibles, MSN, CNM Certified Nurse Midwife, [facility] for [employer], [facility] 05/22/19 11:23 AM

## 2019-05-22 NOTE — Progress Notes (Signed)
Would like to discuss treatment for BV, had a reaction to the boric acid. Now has a white discharge and irritation.

## 2019-05-22 NOTE — Progress Notes (Signed)
Last pap 03/15/2016- abnormal HPV

## 2019-05-22 NOTE — Progress Notes (Signed)
History:  [name] is a 39 y.o. [id number] who presents to clinic today for recurrent vaginal irritation. She had a hysterectomy in 2016 and has had multiple episodes of BV and yeast infections since that time. She has tried multiple OTC and Rx treatments which have temporarily relieved her symptoms, but have not offered lasting relief. She most recently tried boric acid 3 weeks ago but experienced severe burning and discontinued treatment after 3 days. Since that time she has had thin white discharge and itching with no odor or bleeding.   She has her pubic hair removed by waxing.   She is also experiencing some vaginal dryness with intercourse and is asking about possible interventions.   The following portions of the patient's history were reviewed and updated as appropriate: allergies, current medications, family history, past medical history, social history, past surgical history and problem list.  Review of Systems:  Review of Systems  Genitourinary: Negative for dysuria and frequency.       + discharge, itchiness - bleeding, pain, or odor  All other systems reviewed and are negative.     Objective:  Physical Exam BP 129/85   Pulse 94   Wt 251 lb (113.9 kg)   LMP 06/25/2014 (Approximate)   BMI 40.51 kg/m  Physical Exam Genitourinary:    Vagina: Vaginal discharge (white, frothy) and erythema present. No bleeding.      Labs and Imaging No results found for this or any previous visit (from the past 24 hour(s)).  No results found.   Assessment & Plan:  1. Vaginal irritation Will treat empirically for yeast. Will also send Rx for metronidazole and will let pt know what swab shows.  - Refrain from vaginal penetrative intercourse until resolved - Refrain from using all cleansers/detergents at this time - Refrain from waxing  - Encouraged use of oral lactobacillus probiotic - Cervicovaginal ancillary only( [place])  2. Vaginal dryness Will defer treatment at  this time as do not want to introduce further irritants and dryness may self-resolve with balance of vaginal flora.    Dawn [name], Medical Student 05/22/2019 9:25 AM

## 2019-05-22 NOTE — Patient Instructions (Signed)
Preventive Care 21-39 Years Old, Female Preventive care refers to visits with your health care provider and lifestyle choices that can promote health and wellness. This includes:  A yearly physical exam. This may also be called an annual well check.  Regular dental visits and eye exams.  Immunizations.  Screening for certain conditions.  Healthy lifestyle choices, such as eating a healthy diet, getting regular exercise, not using drugs or products that contain nicotine and tobacco, and limiting alcohol use. What can I expect for my preventive care visit? Physical exam Your health care provider will check your:  Height and weight. This may be used to calculate body mass index (BMI), which tells if you are at a healthy weight.  Heart rate and blood pressure.  Skin for abnormal spots. Counseling Your health care provider may ask you questions about your:  Alcohol, tobacco, and drug use.  Emotional well-being.  Home and relationship well-being.  Sexual activity.  Eating habits.  Work and work environment.  Method of birth control.  Menstrual cycle.  Pregnancy history. What immunizations do I need?  Influenza (flu) vaccine  This is recommended every year. Tetanus, diphtheria, and pertussis (Tdap) vaccine  You may need a Td booster every 10 years. Varicella (chickenpox) vaccine  You may need this if you have not been vaccinated. Human papillomavirus (HPV) vaccine  If recommended by your health care provider, you may need three doses over 6 months. Measles, mumps, and rubella (MMR) vaccine  You may need at least one dose of MMR. You may also need a second dose. Meningococcal conjugate (MenACWY) vaccine  One dose is recommended if you are age 19-21 years and a first-year college student living in a residence hall, or if you have one of several medical conditions. You may also need additional booster doses. Pneumococcal conjugate (PCV13) vaccine  You may need  this if you have certain conditions and were not previously vaccinated. Pneumococcal polysaccharide (PPSV23) vaccine  You may need one or two doses if you smoke cigarettes or if you have certain conditions. Hepatitis A vaccine  You may need this if you have certain conditions or if you travel or work in places where you may be exposed to hepatitis A. Hepatitis B vaccine  You may need this if you have certain conditions or if you travel or work in places where you may be exposed to hepatitis B. Haemophilus influenzae type b (Hib) vaccine  You may need this if you have certain conditions. You may receive vaccines as individual doses or as more than one vaccine together in one shot (combination vaccines). Talk with your health care provider about the risks and benefits of combination vaccines. What tests do I need?  Blood tests  Lipid and cholesterol levels. These may be checked every 5 years starting at age 20.  Hepatitis C test.  Hepatitis B test. Screening  Diabetes screening. This is done by checking your blood sugar (glucose) after you have not eaten for a while (fasting).  Sexually transmitted disease (STD) testing.  BRCA-related cancer screening. This may be done if you have a family history of breast, ovarian, tubal, or peritoneal cancers.  Pelvic exam and Pap test. This may be done every 3 years starting at age 21. Starting at age 30, this may be done every 5 years if you have a Pap test in combination with an HPV test. Talk with your health care provider about your test results, treatment options, and if necessary, the need for more tests.   Follow these instructions at home: Eating and drinking   Eat a diet that includes fresh fruits and vegetables, whole grains, lean protein, and low-fat dairy.  Take vitamin and mineral supplements as recommended by your health care provider.  Do not drink alcohol if: ? Your health care provider tells you not to drink. ? You are  pregnant, may be pregnant, or are planning to become pregnant.  If you drink alcohol: ? Limit how much you have to 0-1 drink a day. ? Be aware of how much alcohol is in your drink. In the [place] one drink equals one 12 oz bottle of beer (355 mL), one 5 oz glass of wine (148 mL), or one 1 oz glass of hard liquor (44 mL). Lifestyle  Take daily care of your teeth and gums.  Stay active. Exercise for at least 30 minutes on 5 or more days each week.  Do not use any products that contain nicotine or tobacco, such as cigarettes, e-cigarettes, and chewing tobacco. If you need help quitting, ask your health care provider.  If you are sexually active, practice safe sex. Use a condom or other form of birth control (contraception) in order to prevent pregnancy and STIs (sexually transmitted infections). If you plan to become pregnant, see your health care provider for a preconception visit. What's next?  Visit your health care provider once a year for a well check visit.  Ask your health care provider how often you should have your eyes and teeth checked.  Stay up to date on all vaccines. This information is not intended to replace advice given to you by your health care provider. Make sure you discuss any questions you have with your health care provider. Document Revised: 10/05/2017 Document Reviewed: 10/05/2017 Elsevier Patient Education  2020 [employer].

## 2019-05-22 NOTE — Telephone Encounter (Signed)
Pap due 03/2017. Patient left today's visit without scheduling. VLTCB to schedule at patient's convenience.  Clayton Bibles, MSN, CNM Certified Nurse Midwife, [facility] 05/22/19 11:37 AM

## 2019-05-23 LAB — CERVICOVAGINAL ANCILLARY ONLY
Bacterial Vaginitis (gardnerella): POSITIVE — AB
Candida Glabrata: NEGATIVE
Candida Vaginitis: NEGATIVE
Comment: NEGATIVE
Comment: NEGATIVE
Comment: NEGATIVE

## 2019-06-27 ENCOUNTER — Encounter: Payer: Self-pay | Admitting: Gastroenterology

## 2019-07-03 ENCOUNTER — Other Ambulatory Visit: Payer: Self-pay

## 2019-07-03 ENCOUNTER — Encounter: Payer: Self-pay | Admitting: Advanced Practice Midwife

## 2019-07-03 ENCOUNTER — Ambulatory Visit (INDEPENDENT_AMBULATORY_CARE_PROVIDER_SITE_OTHER): Payer: 59 | Admitting: Advanced Practice Midwife

## 2019-07-03 VITALS — BP 154/98 | HR 119 | Wt 250.0 lb

## 2019-07-03 DIAGNOSIS — N898 Other specified noninflammatory disorders of vagina: Secondary | ICD-10-CM | POA: Diagnosis not present

## 2019-07-03 NOTE — Progress Notes (Signed)
GYNECOLOGY ANNUAL PREVENTATIVE CARE ENCOUNTER NOTE  History:     Dawn [name] is a 39 y.o. G77P1021 female here for a follow-up to previous visit for vaginal irritation.  Current complaints: None.   Denies abnormal vaginal bleeding, discharge, pelvic pain, problems with intercourse or other gynecologic concerns.    Gynecologic History Patient's last menstrual period was 06/25/2014 (approximate). Contraception: status post hysterectomy  Obstetric History OB History  Gravida Para Term Preterm AB Living  3 1 1   2 1   SAB TAB Ectopic Multiple Live Births  2       1    # Outcome Date GA Lbr Len/2nd Weight Sex Delivery Anes PTL Lv  3 SAB 2014     SAB     2 SAB 2013     SAB     1 Term 03/05/02 [redacted]w[redacted]d  7 lb (3.175 kg) F Vag-Spont EPI N LIV    Past Medical History:  Diagnosis Date  . GERD (gastroesophageal reflux disease)   . Migraine dx'd 05/25/2015  . PONV (postoperative nausea and vomiting)   . Vaginal delivery 2004    Past Surgical History:  Procedure Laterality Date  . APPENDECTOMY N/A 12/13/2016   Procedure: POSSIBLE APPENDECTOMY;  Surgeon: 13/07/2016, MD;  Location: [facility] OR;  Service: General;  Laterality: N/A;  . BREAST REDUCTION SURGERY Bilateral 08/28/2014   Procedure: BILATERAL BREAST  REDUCTION  ;  Surgeon: 08/30/2014, MD;  Location: [facility] OR;  Service: Plastics;  Laterality: Bilateral;  . LAPAROSCOPY N/A 02/18/2013   Procedure: LAPAROSCOPY OPERATIVE;  Surgeon: 04/18/2013, MD;  Location: [facility] ORS;  Service: Gynecology;  Laterality: N/A;  YAG LASER  . LAPAROTOMY N/A 12/13/2016   Procedure: DIAGNOSTIC LAPAROTOMY;  Surgeon: 13/07/2016, MD;  Location: [facility] OR;  Service: General;  Laterality: N/A;  . REDUCTION MAMMAPLASTY  08/28/2014  . TONSILLECTOMY  2008  . VAGINAL HYSTERECTOMY  06-26-14  . YAG LASER APPLICATION N/A 02/18/2013   Procedure: YAG LASER APPLICATION;  Surgeon: 04/18/2013, MD;  Location: [facility] ORS;  Service: Gynecology;  Laterality: N/A;    Current  Outpatient Medications on File Prior to Visit  Medication Sig Dispense Refill  . Acidophilus Lactobacillus CAPS Take 1 tablet by mouth daily. 30 capsule 3  . EPINEPHrine 0.3 mg/0.3 mL IJ SOAJ injection Inject 0.3 mg into the muscle once as needed (severe allergic reaction).     . metroNIDAZOLE (FLAGYL) 500 MG tablet Take 1 tablet (500 mg total) by mouth 2 (two) times daily. 14 tablet 2  . metroNIDAZOLE (METROGEL) 0.75 % vaginal gel Place 1 Applicatorful vaginally at bedtime. Apply one applicatorful to vagina at bedtime for 5 days 70 g 1  . valACYclovir (VALTREX) 500 MG tablet Take 500 mg by mouth 2 (two) times daily.     No current facility-administered medications on file prior to visit.    Allergies  Allergen Reactions  . Claritin [Loratadine] Other (See Comments)    Causes hands to shake Tremors  . Other Anaphylaxis    Foods:  Skittles, donuts (all).   Some Makeup.    . Flagyl [Metronidazole] Rash    On upper lip    Social History:  reports that she has never smoked. She has never used smokeless tobacco. She reports that she does not drink alcohol or use drugs.  Family History  Problem Relation Age of Onset  . Hyperlipidemia Mother   . Hypertension Mother   . Allergic rhinitis Mother   .  Asthma Sister   . Allergic rhinitis Sister   . Allergic rhinitis Brother   . Asthma Paternal Uncle     The following portions of the patient's history were reviewed and updated as appropriate: allergies, current medications, past family history, past medical history, past social history, past surgical history and problem list.  Review of Systems Pertinent items noted in HPI and remainder of comprehensive ROS otherwise negative.  Physical Exam:  BP (!) 154/98   Pulse (!) 119   Wt 250 lb (113.4 kg)   LMP 06/25/2014 (Approximate)   BMI 40.35 kg/m  CONSTITUTIONAL: Well-developed, well-nourished female in no acute distress.  HENT:  Normocephalic, atraumatic, SKIN: Skin is warm and dry.  No rash noted. Not diaphoretic. No erythema. No pallor. MUSCULOSKELETAL: Normal range of motion. No tenderness.  No cyanosis, clubbing, or edema.  2+ distal pulses. NEUROLOGIC: Alert and oriented to person, place, and time. Normal reflexes, muscle tone coordination.  PSYCHIATRIC: Normal mood and affect. Normal behavior. Normal judgment and thought content. CARDIOVASCULAR: Normal heart rate noted RESPIRATORY: Effort normal, no problems with respiration noted. ABDOMEN: Soft, no distention noted.  No tenderness, rebound or guarding.  PELVIC: Normal appearing external genitalia and urethral meatus.  No irritation or abnormal discharge noted.  Pap smear obtained. .  Performed in the presence of a chaperone.   Assessment and Plan:    1. Vaginal irritation - Recovered! No concerning findings on exam or reported by patient   Routine preventative health maintenance measures emphasized. Please refer to After Visit Summary for other counseling recommendations.     Total visit time 30 minutes. Greater than 50% of visit spent in counseling and coordination of care.  Mallie Snooks, MSN, CNM Certified Nurse Midwife, [employer] for [employer], [facility] 07/03/19 2:00 PM

## 2019-07-03 NOTE — Progress Notes (Signed)
Follow up, pt states its better, wants to get waxed

## 2019-07-03 NOTE — Patient Instructions (Signed)
Preventive Care 21-39 Years Old, Female Preventive care refers to visits with your health care provider and lifestyle choices that can promote health and wellness. This includes:  A yearly physical exam. This may also be called an annual well check.  Regular dental visits and eye exams.  Immunizations.  Screening for certain conditions.  Healthy lifestyle choices, such as eating a healthy diet, getting regular exercise, not using drugs or products that contain nicotine and tobacco, and limiting alcohol use. What can I expect for my preventive care visit? Physical exam Your health care provider will check your:  Height and weight. This may be used to calculate body mass index (BMI), which tells if you are at a healthy weight.  Heart rate and blood pressure.  Skin for abnormal spots. Counseling Your health care provider may ask you questions about your:  Alcohol, tobacco, and drug use.  Emotional well-being.  Home and relationship well-being.  Sexual activity.  Eating habits.  Work and work environment.  Method of birth control.  Menstrual cycle.  Pregnancy history. What immunizations do I need?  Influenza (flu) vaccine  This is recommended every year. Tetanus, diphtheria, and pertussis (Tdap) vaccine  You may need a Td booster every 10 years. Varicella (chickenpox) vaccine  You may need this if you have not been vaccinated. Human papillomavirus (HPV) vaccine  If recommended by your health care provider, you may need three doses over 6 months. Measles, mumps, and rubella (MMR) vaccine  You may need at least one dose of MMR. You may also need a second dose. Meningococcal conjugate (MenACWY) vaccine  One dose is recommended if you are age 19-21 years and a first-year college student living in a residence hall, or if you have one of several medical conditions. You may also need additional booster doses. Pneumococcal conjugate (PCV13) vaccine  You may need  this if you have certain conditions and were not previously vaccinated. Pneumococcal polysaccharide (PPSV23) vaccine  You may need one or two doses if you smoke cigarettes or if you have certain conditions. Hepatitis A vaccine  You may need this if you have certain conditions or if you travel or work in places where you may be exposed to hepatitis A. Hepatitis B vaccine  You may need this if you have certain conditions or if you travel or work in places where you may be exposed to hepatitis B. Haemophilus influenzae type b (Hib) vaccine  You may need this if you have certain conditions. You may receive vaccines as individual doses or as more than one vaccine together in one shot (combination vaccines). Talk with your health care provider about the risks and benefits of combination vaccines. What tests do I need?  Blood tests  Lipid and cholesterol levels. These may be checked every 5 years starting at age 20.  Hepatitis C test.  Hepatitis B test. Screening  Diabetes screening. This is done by checking your blood sugar (glucose) after you have not eaten for a while (fasting).  Sexually transmitted disease (STD) testing.  BRCA-related cancer screening. This may be done if you have a family history of breast, ovarian, tubal, or peritoneal cancers.  Pelvic exam and Pap test. This may be done every 3 years starting at age 21. Starting at age 30, this may be done every 5 years if you have a Pap test in combination with an HPV test. Talk with your health care provider about your test results, treatment options, and if necessary, the need for more tests.   Follow these instructions at home: Eating and drinking   Eat a diet that includes fresh fruits and vegetables, whole grains, lean protein, and low-fat dairy.  Take vitamin and mineral supplements as recommended by your health care provider.  Do not drink alcohol if: ? Your health care provider tells you not to drink. ? You are  pregnant, may be pregnant, or are planning to become pregnant.  If you drink alcohol: ? Limit how much you have to 0-1 drink a day. ? Be aware of how much alcohol is in your drink. In the [place] one drink equals one 12 oz bottle of beer (355 mL), one 5 oz glass of wine (148 mL), or one 1 oz glass of hard liquor (44 mL). Lifestyle  Take daily care of your teeth and gums.  Stay active. Exercise for at least 30 minutes on 5 or more days each week.  Do not use any products that contain nicotine or tobacco, such as cigarettes, e-cigarettes, and chewing tobacco. If you need help quitting, ask your health care provider.  If you are sexually active, practice safe sex. Use a condom or other form of birth control (contraception) in order to prevent pregnancy and STIs (sexually transmitted infections). If you plan to become pregnant, see your health care provider for a preconception visit. What's next?  Visit your health care provider once a year for a well check visit.  Ask your health care provider how often you should have your eyes and teeth checked.  Stay up to date on all vaccines. This information is not intended to replace advice given to you by your health care provider. Make sure you discuss any questions you have with your health care provider. Document Revised: 10/05/2017 Document Reviewed: 10/05/2017 Elsevier Patient Education  2020 [employer].

## 2019-07-29 ENCOUNTER — Ambulatory Visit: Payer: 59 | Admitting: Physician Assistant

## 2019-08-15 ENCOUNTER — Ambulatory Visit (INDEPENDENT_AMBULATORY_CARE_PROVIDER_SITE_OTHER): Payer: 59 | Admitting: Gastroenterology

## 2019-08-15 ENCOUNTER — Encounter: Payer: Self-pay | Admitting: Gastroenterology

## 2019-08-15 ENCOUNTER — Other Ambulatory Visit (INDEPENDENT_AMBULATORY_CARE_PROVIDER_SITE_OTHER): Payer: 59

## 2019-08-15 VITALS — BP 114/82 | HR 76 | Ht 66.0 in | Wt 250.0 lb

## 2019-08-15 DIAGNOSIS — R140 Abdominal distension (gaseous): Secondary | ICD-10-CM

## 2019-08-15 DIAGNOSIS — K5900 Constipation, unspecified: Secondary | ICD-10-CM

## 2019-08-15 DIAGNOSIS — K760 Fatty (change of) liver, not elsewhere classified: Secondary | ICD-10-CM

## 2019-08-15 LAB — HEPATIC FUNCTION PANEL
ALT: 28 U/L (ref 0–35)
AST: 19 U/L (ref 0–37)
Albumin: 4.3 g/dL (ref 3.5–5.2)
Alkaline Phosphatase: 36 U/L — ABNORMAL LOW (ref 39–117)
Bilirubin, Direct: 0.1 mg/dL (ref 0.0–0.3)
Total Bilirubin: 0.4 mg/dL (ref 0.2–1.2)
Total Protein: 7.2 g/dL (ref 6.0–8.3)

## 2019-08-15 LAB — CBC WITH DIFFERENTIAL/PLATELET
Basophils Absolute: 0 10*3/uL (ref 0.0–0.1)
Basophils Relative: 0.5 % (ref 0.0–3.0)
Eosinophils Absolute: 0.1 10*3/uL (ref 0.0–0.7)
Eosinophils Relative: 0.8 % (ref 0.0–5.0)
HCT: 41 % (ref 36.0–46.0)
Hemoglobin: 13.5 g/dL (ref 12.0–15.0)
Lymphocytes Relative: 25 % (ref 12.0–46.0)
Lymphs Abs: 2.4 10*3/uL (ref 0.7–4.0)
MCHC: 32.9 g/dL (ref 30.0–36.0)
MCV: 81.1 fl (ref 78.0–100.0)
Monocytes Absolute: 0.6 10*3/uL (ref 0.1–1.0)
Monocytes Relative: 6.4 % (ref 3.0–12.0)
Neutro Abs: 6.4 10*3/uL (ref 1.4–7.7)
Neutrophils Relative %: 67.3 % (ref 43.0–77.0)
Platelets: 355 10*3/uL (ref 150.0–400.0)
RBC: 5.06 Mil/uL (ref 3.87–5.11)
RDW: 13.9 % (ref 11.5–15.5)
WBC: 9.5 10*3/uL (ref 4.0–10.5)

## 2019-08-15 LAB — TSH: TSH: 1.61 u[IU]/mL (ref 0.35–4.50)

## 2019-08-15 MED ORDER — POLYETHYLENE GLYCOL 3350 17 G PO PACK: 17.0000 g | PACK | Freq: Two times a day (BID) | ORAL | 0 refills | Status: DC

## 2019-08-15 NOTE — Patient Instructions (Addendum)
If you are age 39 or older, your body mass index should be between 23-30. Your Body mass index is 40.35 kg/m. If this is out of the aforementioned range listed, please consider follow up with your Primary Care Provider.  If you are age 49 or younger, your body mass index should be between 19-25. Your Body mass index is 40.35 kg/m. If this is out of the aformentioned range listed, please consider follow up with your Primary Care Provider.   Please go to the lab in the basement of our building to have lab work done as you leave today. Hit "B" for basement when you get on the elevator.  When the doors open the lab is on your left.  We will call you with the results. Thank you.  Discontinue taking a probiotic.  Take Miralax as directed, twice daily and increase as needed.  If needed, we are giving you samples of Linzess 145 mcg to be taken once daily in the morning for constipation that is not managed by twice daily Miralax.  We have requested your records of colonoscopy from [facility].  Thank you for entrusting me with your care and for choosing [facility], Dr. Ileene Patrick

## 2019-08-15 NOTE — Progress Notes (Signed)
HPI :  39 year old female with a history of constipation, elevated BMI, retinopathy, referred by Tracey Harries, MD for constipation and bloating.  She states her normal baseline is having a bowel movement every 2 to 3 days.  At the end of March she feels that her bowels have changed and she has had worsening of constipation.  She will go anywhere from a week or 2 without having a bowel movement.  When this happens she feels quite bloated and has postprandial abdominal distention.  She can have some discomfort in her mid to left lower abdomen when she feels like this.  She has not had any blood in her stools at all.  She strains in the bathroom and can pass hard stools.  When she does have a bowel movement she will find relief from her symptoms and she feels better for period of time until symptoms recur again.  She is tried a variety of over-the-counter laxative such as MiraLAX, ClearLax, magnesium citrate, fiber supplement, and changing her diet to increase fiber and drink more water.  She has not had much relief from these when she is taking them, however MiraLAX did allow her to have a bowel movement every 3 days or so when she took it.  She did take this routinely but she was concerned about its safety.  She does not have any family history of colon cancer.  She had a colonoscopy in 2015 or 2016 prior to having hysterectomy, done at [facility], she reports it was normal at the time.  She takes a probiotic as needed and states it does not really help any of her symptoms.  She denies any changes in her medications from the onset of the symptoms.  She states it has been about a year since she has had blood work.  I do not see any prior thyroid testing.  She has had a history of appendectomy, had pain after this was removed a few years ago, had a CT scan in December 2018 which did not show any problems with her bowel.  She was incidentally noted to have fatty liver at that time.  She does not drink any alcohol  on a routine basis.  Rare NSAID use, nothing routinely.   CT scan 01/20/17 - IMPRESSION: 1. No complicating features after recent appendectomy. No specific explanation for symptoms. 2. Hepatic steatosis.    Past Medical History:  Diagnosis Date  . Autoimmune retinopathy (HCC)   . Carpal tunnel syndrome of left wrist   . Chorioretinitis and choroiditis, disseminated, generalized   . Dysmenorrhea   . Family history of rheumatoid arthritis   . GERD (gastroesophageal reflux disease)   . Migraine dx'd 05/25/2015  . PONV (postoperative nausea and vomiting)   . Prediabetes   . Vaginal delivery 2004  . Vitamin D deficiency      Past Surgical History:  Procedure Laterality Date  . APPENDECTOMY N/A 12/13/2016   Procedure: POSSIBLE APPENDECTOMY;  Surgeon: Griselda Miner, MD;  Location: [facility] OR;  Service: General;  Laterality: N/A;  . BREAST REDUCTION SURGERY Bilateral 08/28/2014   Procedure: BILATERAL BREAST  REDUCTION  ;  Surgeon: Etter Sjogren, MD;  Location: [name] OR;  Service: Plastics;  Laterality: Bilateral;  . LAPAROSCOPY N/A 02/18/2013   Procedure: LAPAROSCOPY OPERATIVE;  Surgeon: Levi Aland, MD;  Location: [facility] ORS;  Service: Gynecology;  Laterality: N/A;  YAG LASER  . LAPAROTOMY N/A 12/13/2016   Procedure: DIAGNOSTIC LAPAROTOMY;  Surgeon: Griselda Miner, MD;  Location: [facility] OR;  Service: General;  Laterality: N/A;  . TONSILLECTOMY  2008  . VAGINAL HYSTERECTOMY  06-26-14  . YAG LASER APPLICATION N/A 02/18/2013   Procedure: YAG LASER APPLICATION;  Surgeon: Levi Aland, MD;  Location: [facility] ORS;  Service: Gynecology;  Laterality: N/A;   Family History  Problem Relation Age of Onset  . Hyperlipidemia Mother   . Hypertension Mother   . Allergic rhinitis Mother   . Diabetes Mother   . Kidney disease Father   . Asthma Sister   . Allergic rhinitis Sister   . Allergic rhinitis Brother   . Asthma Paternal Uncle   . Prostate cancer Paternal Uncle   . Canavan disease Paternal Grandfather         unknown type  . Colon cancer Neg Hx   . Esophageal cancer Neg Hx   . Pancreatic cancer Neg Hx   . Stomach cancer Neg Hx   . Liver disease Neg Hx    Social History   Tobacco Use  . Smoking status: Never Smoker  . Smokeless tobacco: Never Used  Vaping Use  . Vaping Use: Never used  Substance Use Topics  . Alcohol use: Yes    Comment: 1 drink per month  . Drug use: No   Current Outpatient Medications  Medication Sig Dispense Refill  . Acidophilus Lactobacillus CAPS Take 1 tablet by mouth daily. 30 capsule 3  . EPINEPHrine 0.3 mg/0.3 mL IJ SOAJ injection Inject 0.3 mg into the muscle once as needed (severe allergic reaction).     . valACYclovir (VALTREX) 500 MG tablet Take 500 mg by mouth 2 (two) times daily as needed.      No current facility-administered medications for this visit.   Allergies  Allergen Reactions  . Claritin [Loratadine] Other (See Comments)    Causes hands to shake Tremors  . Other Anaphylaxis    Foods:  Skittles, donuts (all).   Some Makeup.    . Flagyl [Metronidazole] Rash    On upper lip     Review of Systems: All systems reviewed and negative except where noted in HPI.   Lab Results  Component Value Date   WBC 6.8 12/27/2016   HGB 11.9 (L) 12/27/2016   HCT 37.0 12/27/2016   MCV 80.8 12/27/2016   PLT 328 12/27/2016    Lab Results  Component Value Date   CREATININE 0.77 12/27/2016   BUN 10 12/27/2016   NA 139 12/27/2016   K 3.6 12/27/2016   CL 106 12/27/2016   CO2 26 12/27/2016    Lab Results  Component Value Date   ALT 24 12/26/2016   AST 20 12/26/2016   ALKPHOS 37 (L) 12/26/2016   BILITOT 0.7 12/26/2016     Physical Exam: BP 114/82   Pulse 76   Ht 5\' 6"  (1.676 m)   Wt 250 lb (113.4 kg)   LMP 06/25/2014 (Approximate)   BMI 40.35 kg/m  Constitutional: Pleasant,well-developed, female in no acute distress. HEENT: Normocephalic and atraumatic. Conjunctivae are normal. No scleral icterus. Neck supple.    Cardiovascular: Normal rate, regular rhythm.  Pulmonary/chest: Effort normal and breath sounds normal. No wheezing, rales or rhonchi. Abdominal: Soft, protuberant, nontender.  There are no masses palpable.  Extremities: no edema Lymphadenopathy: No cervical adenopathy noted. Neurological: Alert and oriented to person place and time. Skin: Skin is warm and dry. No rashes noted. Psychiatric: Normal mood and affect. Behavior is normal.   ASSESSMENT AND PLAN: 39 year old female here for new patient assessment  of the following:  Constipation / bloating - at baseline has had some mild constipation now with progressive worsening since March, associated with bloating and discomfort in her abdomen.  No blood in the stools or alarm symptoms otherwise.  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  It sounds like she did have some benefit from low-dose MiraLAX in the past, we discussed options.  We will send her to the lab for baseline TSH and CBC to ensure stable, and we will obtain results of her last colonoscopy to ensure no high risk lesions.  Otherwise we discussed management options and recommend resuming MiraLAX dosed at twice a day and can titrate up or down as needed/tolerated.  Hopefully this works for her and she tolerates it okay.  We discussed other stronger/prescription based modalities, I did give her a free sample of Linzess 145 mcg a day to trial if the MiraLAX does not help her or if she would prefer to take a pill for this.  I counseled her of side effects to include abdominal cramping and diarrhea etc.  She will see how she does on the MiraLAX over the next few weeks and can use Linzess if she needs to as well.  I asked her to touch base with me in few weeks for an update and we can discuss long-term management pending her course.  If she does not get better with either these regimens we will discuss other options.  Otherwise recommend she stop probiotic in the  interim, not providing any benefit to date.  Fatty liver - noted incidentally on imaging in 2018, she does not drink alcohol.  This is likely related to her weight, we discussed what this is and risks for inflammation of the liver.  We will check baseline LFTs to ensure okay, long-term, weight loss would be beneficial for this.  Ileene Patrick, MD [facility]  CC: Tracey Harries, MD

## 2019-08-21 ENCOUNTER — Telehealth: Payer: Self-pay | Admitting: Gastroenterology

## 2019-08-21 NOTE — Telephone Encounter (Signed)
Recall colon placed for 04-2023

## 2019-08-21 NOTE — Telephone Encounter (Signed)
Colonoscopy records arrived - 04/14/2013 - Dr. Randa Evens - good prep- 2 small hyperplastic rectal polyps, otherwise normal exam.   Jan can you please place a recall for 04/2023 for screening colonoscopy. Thanks

## 2019-08-23 ENCOUNTER — Other Ambulatory Visit: Payer: Self-pay

## 2019-08-23 ENCOUNTER — Encounter: Payer: Self-pay | Admitting: Physician Assistant

## 2019-08-23 ENCOUNTER — Ambulatory Visit (INDEPENDENT_AMBULATORY_CARE_PROVIDER_SITE_OTHER): Payer: 59 | Admitting: Physician Assistant

## 2019-08-23 VITALS — BP 130/70 | HR 87 | Temp 97.4°F | Ht 65.5 in | Wt 252.2 lb

## 2019-08-23 DIAGNOSIS — R2242 Localized swelling, mass and lump, left lower limb: Secondary | ICD-10-CM | POA: Diagnosis not present

## 2019-08-23 DIAGNOSIS — M25552 Pain in left hip: Secondary | ICD-10-CM | POA: Diagnosis not present

## 2019-08-23 NOTE — Patient Instructions (Signed)
It was great to see you!  If your swelling in your feet get worse or if your hip doesn't feel better, come back to see me.  Follow-up after Aug 18 for a physical if you'd like.  Take care,  Jarold Motto PA-C

## 2019-08-23 NOTE — Progress Notes (Signed)
Dawn [name] is a 39 y.o. female is here to establish care.  I acted as a Neurosurgeon for [employer], PA-C [employer], LPN   History of Present Illness:   Chief Complaint  Patient presents with   Edema    ankles and feet   Hip Pain    HPI   Edema Pt c/o swelling of left foot and ankle, started on June 20th when she was traveling to [place]. Pt said took about 1 week for swelling to go away. Has intermittent issues with this ankle/foot. History of ankle injury and was recommended to have surgery but she never did. Denies excessive salt intake, chest pain, SOB, nausea, palpitations, calf pain/tenderness.  Hip pain Pt c/o left hip pain x 1.5 months, sharp pain with movement. She has not taken anything for pain. Pain can be with a lot or even minimal activity. Denies numbness/tingling to L leg. Denies prior injury. Sometimes has to lay down for pain to go away. Denies known trauma.   There are no preventive care reminders to display for this patient.  Past Medical History:  Diagnosis Date   Allergy    Autoimmune retinopathy (HCC)    Carpal tunnel syndrome of left wrist    Chorioretinitis and choroiditis, disseminated, generalized    Dysmenorrhea    Family history of rheumatoid arthritis    PONV (postoperative nausea and vomiting)    Vaginal delivery 2004   Vitamin D deficiency      Social History   Tobacco Use   Smoking status: Never Smoker   Smokeless tobacco: Never Used  Vaping Use   Vaping Use: Never used  Substance Use Topics   Alcohol use: Yes    Comment: 1 drink per month   Drug use: No    Past Surgical History:  Procedure Laterality Date   APPENDECTOMY N/A 12/13/2016   Procedure: POSSIBLE APPENDECTOMY;  Surgeon: Griselda Miner, MD;  Location: [facility] OR;  Service: General;  Laterality: N/A;   BREAST REDUCTION SURGERY Bilateral 08/28/2014   Procedure: BILATERAL BREAST  REDUCTION  ;  Surgeon: Etter Sjogren, MD;  Location: [facility] OR;  Service:  Plastics;  Laterality: Bilateral;   CARPAL TUNNEL RELEASE Left 2019   LAPAROSCOPY N/A 02/18/2013   Procedure: LAPAROSCOPY OPERATIVE;  Surgeon: Levi Aland, MD;  Location: [facility] ORS;  Service: Gynecology;  Laterality: N/A;  YAG LASER   LAPAROTOMY N/A 12/13/2016   Procedure: DIAGNOSTIC LAPAROTOMY;  Surgeon: Griselda Miner, MD;  Location: [facility] OR;  Service: General;  Laterality: N/A;   REDUCTION MAMMAPLASTY Bilateral 2016   TONSILLECTOMY  2008   VAGINAL HYSTERECTOMY  06-26-14   YAG LASER APPLICATION N/A 02/18/2013   Procedure: YAG LASER APPLICATION;  Surgeon: Levi Aland, MD;  Location: [facility] ORS;  Service: Gynecology;  Laterality: N/A;    Family History  Problem Relation Age of Onset   Hyperlipidemia Mother    Hypertension Mother    Allergic rhinitis Mother    Diabetes Mother    Kidney disease Father    Asthma Sister    Allergic rhinitis Sister    Allergic rhinitis Brother    Asthma Paternal Uncle    Prostate cancer Paternal Uncle    Canavan disease Paternal Grandfather        unknown type   Cancer Paternal Grandfather    Colon cancer Neg Hx    Esophageal cancer Neg Hx    Pancreatic cancer Neg Hx    Stomach cancer Neg Hx  Liver disease Neg Hx     PMHx, SurgHx, SocialHx, FamHx, Medications, and Allergies were reviewed in the Visit Navigator and updated as appropriate.   Patient Active Problem List   Diagnosis Date Noted   Status post laparoscopic appendectomy 12/26/2016   Abdominal pain 12/12/2016   Well woman exam 03/15/2016   Possible exposure to STD 03/15/2016   Allergic reactions 01/05/2016   Seasonal and perennial allergic rhinitis 01/05/2016   Nasal polyps 01/05/2016   Stroke-like symptoms 05/25/2015   Cephalalgia    Essential hypertension    Breast hypertrophy in female 08/28/2014   Obesity 04/03/2014   Back pain 04/03/2014   Chronic sinusitis 04/03/2014   Endometriosis determined by laparoscopy 04/03/2014   Recurrent  headache 11/08/2012   Genital herpes 10/25/2011   Avitaminosis D 09/05/2011   Acid reflux 08/05/2011   Adult BMI 30+ 08/05/2011    Social History   Tobacco Use   Smoking status: Never Smoker   Smokeless tobacco: Never Used  Vaping Use   Vaping Use: Never used  Substance Use Topics   Alcohol use: Yes    Comment: 1 drink per month   Drug use: No    Current Medications and Allergies:    Current Outpatient Medications:    Azelastine HCl 137 MCG/SPRAY SOLN, Place 1 spray into the nose in the morning and at bedtime. , Disp: , Rfl:    EPINEPHrine 0.3 mg/0.3 mL IJ SOAJ injection, Inject 0.3 mg into the muscle once as needed (severe allergic reaction). , Disp: , Rfl:    fluticasone (FLONASE) 50 MCG/ACT nasal spray, Place 2 sprays into both nostrils daily. , Disp: , Rfl:    levocetirizine (XYZAL) 5 MG tablet, Take 5 mg by mouth daily., Disp: , Rfl:    montelukast (SINGULAIR) 10 MG tablet, Take 10 mg by mouth daily., Disp: , Rfl:    polyethylene glycol (MIRALAX) 17 g packet, Take 17 g by mouth 2 (two) times daily., Disp: 1 each, Rfl: 0   valACYclovir (VALTREX) 500 MG tablet, Take 500 mg by mouth 2 (two) times daily as needed. , Disp: , Rfl:    Allergies  Allergen Reactions   Claritin [Loratadine] Other (See Comments)    Causes hands to shake Tremors   Other Anaphylaxis    Foods:  Skittles, donuts (all).   Some Makeup.     Flagyl [Metronidazole] Rash    On upper lip    Review of Systems   ROS  Negative unless otherwise specified per HPI.  Vitals:   Vitals:   08/23/19 1436  BP: 130/70  Pulse: 87  Temp: (!) 97.4 F (36.3 C)  TempSrc: Temporal  SpO2: 95%  Weight: 252 lb 4 oz (114.4 kg)  Height: 5' 5.5" (1.664 m)     Body mass index is 41.34 kg/m.   Physical Exam:    Physical Exam Vitals and nursing note reviewed.  Constitutional:      General: She is not in acute distress.    Appearance: She is well-developed. She is not ill-appearing or  toxic-appearing.  Cardiovascular:     Rate and Rhythm: Normal rate and regular rhythm.     Pulses: Normal pulses.     Heart sounds: Normal heart sounds, S1 normal and S2 normal.     Comments: Trace b/l LE edema Pulmonary:     Effort: Pulmonary effort is normal.     Breath sounds: Normal breath sounds.  Musculoskeletal:     Comments: Pain elicited to L hip with straight  leg raise; pain with resisted leg abduction. No point tenderness.   Skin:    General: Skin is warm and dry.  Neurological:     Mental Status: She is alert.     GCS: GCS eye subscore is 4. GCS verbal subscore is 5. GCS motor subscore is 6.  Psychiatric:        Speech: Speech normal.        Behavior: Behavior normal. Behavior is cooperative.      Assessment and Plan:    Dawn [name] was seen today for edema and hip pain.  Diagnoses and all orders for this visit:  Left hip pain Possible bursitis. Provided exercises. Consider anti-inflammatories. If no improvement, or any worsening, recommended patient follow-up.  Localized swelling of left foot No red flags on exam. Suspect possibly related to prior injury. Follow-up if symptoms worsen/persist.   Reviewed expectations re: course of current medical issues.  Discussed self-management of symptoms.  Outlined signs and symptoms indicating need for more acute intervention.  Patient verbalized understanding and all questions were answered.  See orders for this visit as documented in the electronic medical record.  Patient received an After Visit Summary.  CMA or LPN served as scribe during this visit. History, Physical, and Plan performed by medical provider. The above documentation has been reviewed and is accurate and complete.  Jarold Motto, PA-C [place], [place] 08/23/2019  Follow-up: No follow-ups on file.

## 2019-09-18 ENCOUNTER — Encounter: Payer: Self-pay | Admitting: Physician Assistant

## 2019-09-18 ENCOUNTER — Telehealth (INDEPENDENT_AMBULATORY_CARE_PROVIDER_SITE_OTHER): Payer: 59 | Admitting: Physician Assistant

## 2019-09-18 VITALS — Temp 97.3°F

## 2019-09-18 DIAGNOSIS — J029 Acute pharyngitis, unspecified: Secondary | ICD-10-CM | POA: Diagnosis not present

## 2019-09-18 MED ORDER — PREDNISONE 20 MG PO TABS: 40.0000 mg | ORAL_TABLET | Freq: Every day | ORAL | 0 refills | Status: DC

## 2019-09-18 NOTE — Progress Notes (Signed)
Virtual Visit via Video   I connected with Dawn [name] on 09/18/19 at 12:00 PM EDT by a video enabled telemedicine application and verified that I am speaking with the correct person using two identifiers. Location patient: Home Location provider: [facility], Office Persons participating in the virtual visit: Arna, Luis PA-C  I discussed the limitations of evaluation and management by telemedicine and the availability of in person appointments. The patient expressed understanding and agreed to proceed.   Subjective:   HPI:   Symptom onset: 5 days ago  Vaccination status: completely vaccinated, received Pfizer on 3/21 and 4/11. Had both a negative rapid and   Patient endorses the following symptoms: sinus congestion and ear fullness, PND, sore throat  Patient denies the following symptoms: subjective fever, wheezing and myalgia  Treatments tried: Allegra D  Patient risk factors: Current COVID-19 risk of complications score: 2 Smoking status: Cynai Skeens  reports that she has never smoked. She has never used smokeless tobacco. If female, currently pregnant? []   Yes []   No  ROS: See pertinent positives and negatives per HPI.  Patient Active Problem List   Diagnosis Date Noted   Status post laparoscopic appendectomy 12/26/2016   Abdominal pain 12/12/2016   Well woman exam 03/15/2016   Possible exposure to STD 03/15/2016   Allergic reactions 01/05/2016   Seasonal and perennial allergic rhinitis 01/05/2016   Nasal polyps 01/05/2016   Stroke-like symptoms 05/25/2015   Cephalalgia    Essential hypertension    Breast hypertrophy in female 08/28/2014   Obesity 04/03/2014   Back pain 04/03/2014   Chronic sinusitis 04/03/2014   Endometriosis determined by laparoscopy 04/03/2014   Recurrent headache 11/08/2012   Genital herpes 10/25/2011   Avitaminosis D 09/05/2011   Acid reflux 08/05/2011   Adult BMI 30+ 08/05/2011    Social  History   Tobacco Use   Smoking status: Never Smoker   Smokeless tobacco: Never Used  Substance Use Topics   Alcohol use: Yes    Comment: 1 drink per month    Current Outpatient Medications:    Azelastine HCl 137 MCG/SPRAY SOLN, Place 1 spray into the nose in the morning and at bedtime. , Disp: , Rfl:    EPINEPHrine 0.3 mg/0.3 mL IJ SOAJ injection, Inject 0.3 mg into the muscle once as needed (severe allergic reaction). , Disp: , Rfl:    fluticasone (FLONASE) 50 MCG/ACT nasal spray, Place 2 sprays into both nostrils daily. , Disp: , Rfl:    levocetirizine (XYZAL) 5 MG tablet, Take 5 mg by mouth daily., Disp: , Rfl:    polyethylene glycol (MIRALAX) 17 g packet, Take 17 g by mouth 2 (two) times daily., Disp: 1 each, Rfl: 0   valACYclovir (VALTREX) 500 MG tablet, Take 500 mg by mouth 2 (two) times daily as needed. , Disp: , Rfl:    montelukast (SINGULAIR) 10 MG tablet, Take 10 mg by mouth daily. (Patient not taking: Reported on 09/18/2019), Disp: , Rfl:    predniSONE (DELTASONE) 20 MG tablet, Take 2 tablets (40 mg total) by mouth daily., Disp: 10 tablet, Rfl: 0  Allergies  Allergen Reactions   Claritin [Loratadine] Other (See Comments)    Causes hands to shake Tremors   Other Anaphylaxis    Foods:  Skittles, donuts (all).   Some Makeup.     Diflucan [Fluconazole] Rash    On upper lip    Objective:   VITALS: Per patient if applicable, see vitals. GENERAL: Alert, appears well and  in no acute distress. HEENT: Atraumatic, conjunctiva clear, no obvious abnormalities on inspection of external nose and ears. NECK: Normal movements of the head and neck. CARDIOPULMONARY: No increased WOB. Speaking in clear sentences. I:E ratio WNL.  MS: Moves all visible extremities without noticeable abnormality. PSYCH: Pleasant and cooperative, well-groomed. Speech normal rate and rhythm. Affect is appropriate. Insight and judgement are appropriate. Attention is focused, linear, and  appropriate.  NEURO: CN grossly intact. Oriented as arrived to appointment on time with no prompting. Moves both UE equally.  SKIN: No obvious lesions, wounds, erythema, or cyanosis noted on face or hands.  Assessment and Plan:   Dawn [name] was seen today for post-nasal drainage, phlegm in chest and shortness of breath.  Diagnoses and all orders for this visit:  Pharyngitis, unspecified etiology  Other orders -     predniSONE (DELTASONE) 20 MG tablet; Take 2 tablets (40 mg total) by mouth daily.    No red flags on discussion, patient is not in any obvious distress during our visit. Discussed progression of most viral illness, and recommended supportive care at this point in time. I did however provide oral prednisone to help wither her symptoms, should they not improve as anticipated. Discussed over the counter supportive care options, with recommendations to push fluids and rest. Reviewed return precautions including new/worsening fever, SOB, new/worsening cough or other concerns.  Recommended need to self-quarantine and practice social distancing until symptoms resolve.  I recommend that patient follow-up if symptoms worsen or persist despite treatment x 7-10 days, sooner if needed.    Reviewed expectations re: course of current medical issues.  Discussed self-management of symptoms.  Outlined signs and symptoms indicating need for more acute intervention.  Patient verbalized understanding and all questions were answered.  Health Maintenance issues including appropriate healthy diet, exercise, and smoking avoidance were discussed with patient.  See orders for this visit as documented in the electronic medical record.  I discussed the assessment and treatment plan with the patient. The patient was provided an opportunity to ask questions and all were answered. The patient agreed with the plan and demonstrated an understanding of the instructions.   The patient was advised to call  back or seek an in-person evaluation if the symptoms worsen or if the condition fails to improve as anticipated.   CMA or LPN served as scribe during this visit. History, Physical, and Plan performed by medical provider. The above documentation has been reviewed and is accurate and complete.   [place], [place] 09/18/2019

## 2019-10-07 ENCOUNTER — Ambulatory Visit (INDEPENDENT_AMBULATORY_CARE_PROVIDER_SITE_OTHER): Payer: 59 | Admitting: Physician Assistant

## 2019-10-07 ENCOUNTER — Encounter: Payer: Self-pay | Admitting: Physician Assistant

## 2019-10-07 ENCOUNTER — Other Ambulatory Visit: Payer: Self-pay

## 2019-10-07 VITALS — BP 126/90 | HR 93 | Temp 97.5°F | Ht 65.5 in | Wt 252.2 lb

## 2019-10-07 DIAGNOSIS — T7840XA Allergy, unspecified, initial encounter: Secondary | ICD-10-CM

## 2019-10-07 MED ORDER — METHYLPREDNISOLONE ACETATE 80 MG/ML IJ SUSP
80.0000 mg | Freq: Once | INTRAMUSCULAR | Status: AC
  Administered 2019-10-07: 80 mg via INTRAMUSCULAR

## 2019-10-07 NOTE — Progress Notes (Signed)
Dawn [name] is a 39 y.o. female is here for follow up.  I acted as a Neurosurgeon for [employer], PA-C [place]y [place], LPN   History of Present Illness:   Chief Complaint  Patient presents with  . Sinus Problem    HPI   Allergies Pt following up today, had a video visit on 8/11 and was given Prednisone completed course. Pt said symptoms returned last Thursday. She is having nasal and chest congestion, headaches. Pt has been taking Allegra D with little relief.  She is fully COVID-19 vaccinated at this time.  Denies: fevers, chills, chest pain, SOB, cough  Does see an allergist, most recently saw in January 2021.   Health Maintenance Due  Topic Date Due  . MAMMOGRAM  Never done  . INFLUENZA VACCINE  09/08/2019    Past Medical History:  Diagnosis Date  . Allergy   . Autoimmune retinopathy (HCC)   . Carpal tunnel syndrome of left wrist   . Chorioretinitis and choroiditis, disseminated, generalized   . Dysmenorrhea   . Family history of rheumatoid arthritis   . PONV (postoperative nausea and vomiting)   . Vaginal delivery 2004  . Vitamin D deficiency      Social History   Tobacco Use  . Smoking status: Never Smoker  . Smokeless tobacco: Never Used  Vaping Use  . Vaping Use: Never used  Substance Use Topics  . Alcohol use: Yes    Comment: 1 drink per month  . Drug use: No    Past Surgical History:  Procedure Laterality Date  . APPENDECTOMY N/A 12/13/2016   Procedure: POSSIBLE APPENDECTOMY;  Surgeon: Griselda Miner, MD;  Location: [facility] OR;  Service: General;  Laterality: N/A;  . BREAST REDUCTION SURGERY Bilateral 08/28/2014   Procedure: BILATERAL BREAST  REDUCTION  ;  Surgeon: Etter Sjogren, MD;  Location[facility] OR;  Service: Plastics;  Laterality: Bilateral;  . CARPAL TUNNEL RELEASE Left 2019  . LAPAROSCOPY N/A 02/18/2013   Procedure: LAPAROSCOPY OPERATIVE;  Surgeon: Levi Aland, MD;  Location: [facility];  Service: Gynecology;  Laterality: N/A;  YAG LASER  .  LAPAROTOMY N/A 12/13/2016   Procedure: DIAGNOSTIC LAPAROTOMY;  Surgeon: Griselda Miner, MD;  Location: [facility] OR;  Service: General;  Laterality: N/A;  . REDUCTION MAMMAPLASTY Bilateral 2016  . TONSILLECTOMY  2008  . VAGINAL HYSTERECTOMY  06-26-14  . YAG LASER APPLICATION N/A 02/18/2013   Procedure: YAG LASER APPLICATION;  Surgeon: Levi Aland, MD;  Location: [facility] ORS;  Service: Gynecology;  Laterality: N/A;    Family History  Problem Relation Age of Onset  . Hyperlipidemia Mother   . Hypertension Mother   . Allergic rhinitis Mother   . Diabetes Mother   . Kidney disease Father   . Asthma Sister   . Allergic rhinitis Sister   . Allergic rhinitis Brother   . Asthma Paternal Uncle   . Prostate cancer Paternal Uncle   . Canavan disease Paternal Grandfather        unknown type  . Cancer Paternal Grandfather   . Colon cancer Neg Hx   . Esophageal cancer Neg Hx   . Pancreatic cancer Neg Hx   . Stomach cancer Neg Hx   . Liver disease Neg Hx     PMHx, SurgHx, SocialHx, FamHx, Medications, and Allergies were reviewed in the Visit Navigator and updated as appropriate.   Patient Active Problem List   Diagnosis Date Noted  . Class 3 severe obesity in adult [facility]) 10/10/2018  .  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 Status post laparoscopic appendectomy 12/26/2016  . Abdominal pain 12/12/2016  . Well woman exam 03/15/2016  . Possible exposure to STD 03/15/2016  . Allergic reactions 01/05/2016  . Seasonal and perennial allergic rhinitis 01/05/2016  . Nasal polyps 01/05/2016  . Essential (primary) hypertension 08/25/2015  . Stroke-like symptoms 05/25/2015  . Cephalalgia   . Essential hypertension   . Breast hypertrophy in female 08/28/2014  . Obesity 04/03/2014  . Back pain 04/03/2014  . Chronic sinusitis 04/03/2014  . Endometriosis determined by laparoscopy 04/03/2014  . Recurrent headache 11/08/2012  . Genital herpes 10/25/2011  . Avitaminosis D 09/05/2011  . Acid reflux 08/05/2011  .  Adult BMI 30+ 08/05/2011  . Gastroesophageal reflux disease 08/05/2011    Social History   Tobacco Use  . Smoking status: Never Smoker  . Smokeless tobacco: Never Used  Vaping Use  . Vaping Use: Never used  Substance Use Topics  . Alcohol use: Yes    Comment: 1 drink per month  . Drug use: No    Current Medications and Allergies:    Current Outpatient Medications:  .  Azelastine HCl 137 MCG/SPRAY SOLN, Place 1 spray into the nose in the morning and at bedtime. , Disp: , Rfl:  .  EPINEPHrine 0.3 mg/0.3 mL IJ SOAJ injection, Inject 0.3 mg into the muscle once as needed (severe allergic reaction). , Disp: , Rfl:  .  fluticasone (FLONASE) 50 MCG/ACT nasal spray, Place 2 sprays into both nostrils daily. , Disp: , Rfl:  .  levocetirizine (XYZAL) 5 MG tablet, Take 5 mg by mouth daily., Disp: , Rfl:  .  montelukast (SINGULAIR) 10 MG tablet, Take 10 mg by mouth daily. , Disp: , Rfl:  .  polyethylene glycol (MIRALAX) 17 g packet, Take 17 g by mouth 2 (two) times daily., Disp: 1 each, Rfl: 0 .  valACYclovir (VALTREX) 500 MG tablet, Take 500 mg by mouth 2 (two) times daily as needed. , Disp: , Rfl:   Current Facility-Administered Medications:  .  methylPREDNISolone acetate (DEPO-MEDROL) injection 80 mg, 80 mg, Intramuscular, Once, Jarold Motto, [place]   Allergies  Allergen Reactions  . Claritin [Loratadine] Other (See Comments)    Causes hands to shake Tremors  . Other Anaphylaxis    Foods:  Skittles, donuts (all).   Some Makeup.    . Diflucan [Fluconazole] Rash    On upper lip    Review of Systems   ROS  Negative unless otherwise specified per HPI.  Vitals:   Vitals:   10/07/19 0814  BP: 126/90  Pulse: 93  Temp: (!) 97.5 F (36.4 C)  TempSrc: Temporal  SpO2: 97%  Weight: 252 lb 4 oz (114.4 kg)  Height: 5' 5.5" (1.664 m)     Body mass index is 41.34 kg/m.   Physical Exam:    Physical Exam Vitals and nursing note reviewed.  Constitutional:      General: She  is not in acute distress.    Appearance: She is well-developed. She is not ill-appearing or toxic-appearing.  Cardiovascular:     Rate and Rhythm: Normal rate and regular rhythm.     Pulses: Normal pulses.     Heart sounds: Normal heart sounds, S1 normal and S2 normal.     Comments: No LE edema Pulmonary:     Effort: Pulmonary effort is normal.     Breath sounds: Normal breath sounds.  Skin:    General: Skin is warm and dry.  Neurological:  Mental Status: She is alert.     GCS: GCS eye subscore is 4. GCS verbal subscore is 5. GCS motor subscore is 6.  Psychiatric:        Speech: Speech normal.        Behavior: Behavior normal. Behavior is cooperative.      Assessment and Plan:    Ilisha was seen today for sinus problem.  Diagnoses and all orders for this visit:  Allergy, initial encounter -     methylPREDNISolone acetate (DEPO-MEDROL) injection 80 mg   No red flags on discussion. Will trial depo-medrol injection today. Will switch Allegra to Xyzal. Follow-up with allergist if worsening symptoms.  . Reviewed expectations re: course of current medical issues. . Discussed self-management of symptoms. . Outlined signs and symptoms indicating need for more acute intervention. . Patient verbalized understanding and all questions were answered. . See orders for this visit as documented in the electronic medical record. . Patient received an After Visit Summary.  CMA or LPN served as scribe during this visit. History, Physical, and Plan performed by medical provider. The above documentation has been reviewed and is accurate and complete.  Jarold Motto, PA-C [place], [place] 10/07/2019  Follow-up: No follow-ups on file.

## 2019-10-07 NOTE — Progress Notes (Deleted)
I acted as a Neurosurgeon for [employer], PA-C Corky Mull, LPN   Subjective:    Dawn [name] is a 39 y.o. female and is here for a comprehensive physical exam.  HPI  Health Maintenance Due  Topic Date Due  . INFLUENZA VACCINE  09/08/2019    Acute Concerns:   Chronic Issues:   Health Maintenance: Immunizations -- *** Colonoscopy -- N/A Mammogram -- N/A PAP -- *** Bone Density -- N/A Diet -- *** Caffeine intake -- *** Sleep habits -- *** Exercise -- *** Current Weight --    Weight History: Wt Readings from Last 10 Encounters:  08/23/19 252 lb 4 oz (114.4 kg)  08/15/19 250 lb (113.4 kg)  07/03/19 250 lb (113.4 kg)  05/22/19 251 lb (113.9 kg)  10/09/18 259 lb (117.5 kg)  12/26/16 257 lb (116.6 kg)  12/12/16 253 lb 8.5 oz (115 kg)  08/16/16 256 lb (116.1 kg)  03/15/16 247 lb 4.8 oz (112.2 kg)  01/05/16 257 lb (116.6 kg)   There is no height or weight on file to calculate BMI. Mood -- *** Patient's last menstrual period was 06/25/2014 (approximate). Period characteristics -- *** Birth control -- ***  Depression screen PHQ 2/9 08/23/2019  Decreased Interest 0  Down, Depressed, Hopeless 0  PHQ - 2 Score 0     Other providers/specialists: Patient Care Team: Jarold Motto, [place] as PCP - General (Physician Assistant)   PMHx, SurgHx, SocialHx, Medications, and Allergies were reviewed in the Visit Navigator and updated as appropriate.   Past Medical History:  Diagnosis Date  . Allergy   . Autoimmune retinopathy (HCC)   . Carpal tunnel syndrome of left wrist   . Chorioretinitis and choroiditis, disseminated, generalized   . Dysmenorrhea   . Family history of rheumatoid arthritis   . PONV (postoperative nausea and vomiting)   . Vaginal delivery 2004  . Vitamin D deficiency     Past Surgical History:  Procedure Laterality Date  . APPENDECTOMY N/A 12/13/2016   Procedure: POSSIBLE APPENDECTOMY;  Surgeon: Griselda Miner, MD;  Location: [facility] OR;   Service: General;  Laterality: N/A;  . BREAST REDUCTION SURGERY Bilateral 08/28/2014   Procedure: BILATERAL BREAST  REDUCTION  ;  Surgeon: Etter Sjogren, MD;  Location: [facility] OR;  Service: Plastics;  Laterality: Bilateral;  . CARPAL TUNNEL RELEASE Left 2019  . LAPAROSCOPY N/A 02/18/2013   Procedure: LAPAROSCOPY OPERATIVE;  Surgeon: Levi Aland, MD;  Location: [facility] ORS;  Service: Gynecology;  Laterality: N/A;  YAG LASER  . LAPAROTOMY N/A 12/13/2016   Procedure: DIAGNOSTIC LAPAROTOMY;  Surgeon: Griselda Miner, MD;  Location: [facility] OR;  Service: General;  Laterality: N/A;  . REDUCTION MAMMAPLASTY Bilateral 2016  . TONSILLECTOMY  2008  . VAGINAL HYSTERECTOMY  06-26-14  . YAG LASER APPLICATION N/A 02/18/2013   Procedure: YAG LASER APPLICATION;  Surgeon: Levi Aland, MD;  Location: [facility] ORS;  Service: Gynecology;  Laterality: N/A;    Family History  Problem Relation Age of Onset  . Hyperlipidemia Mother   . Hypertension Mother   . Allergic rhinitis Mother   . Diabetes Mother   . Kidney disease Father   . Asthma Sister   . Allergic rhinitis Sister   . Allergic rhinitis Brother   . Asthma Paternal Uncle   . Prostate cancer Paternal Uncle   . Canavan disease Paternal Grandfather        unknown type  . Cancer Paternal Grandfather   . Colon cancer Neg Hx   .  Esophageal cancer Neg Hx   . Pancreatic cancer Neg Hx   . Stomach cancer Neg Hx   . Liver disease Neg Hx     Social History   Tobacco Use  . Smoking status: Never Smoker  . Smokeless tobacco: Never Used  Vaping Use  . Vaping Use: Never used  Substance Use Topics  . Alcohol use: Yes    Comment: 1 drink per month  . Drug use: No    Review of Systems:   ROS  Objective:   LMP 06/25/2014 (Approximate)   General Appearance:    Alert, cooperative, no distress, appears stated age  Head:    Normocephalic, without obvious abnormality, atraumatic  Eyes:    PERRL, conjunctiva/corneas clear, EOM's intact, fundi    benign, both eyes    Ears:    Normal TM's and external ear canals, both ears  Nose:   Nares normal, septum midline, mucosa normal, no drainage    or sinus tenderness  Throat:   Lips, mucosa, and tongue normal; teeth and gums normal  Neck:   Supple, symmetrical, trachea midline, no adenopathy;    thyroid:  no enlargement/tenderness/nodules; no carotid   bruit or JVD  Back:     Symmetric, no curvature, ROM normal, no CVA tenderness  Lungs:     Clear to auscultation bilaterally, respirations unlabored  Chest Wall:    No tenderness or deformity   Heart:    Regular rate and rhythm, S1 and S2 normal, no murmur, rub   or gallop  Breast Exam:    No tenderness, masses, or nipple abnormality  Abdomen:     Soft, non-tender, bowel sounds active all four quadrants,    no masses, no organomegaly  Genitalia:    Normal female without lesion, discharge or tenderness  Rectal:    Normal tone no masses or tenderness  Extremities:   Extremities normal, atraumatic, no cyanosis or edema  Pulses:   2+ and symmetric all extremities  Skin:   Skin color, texture, turgor normal, no rashes or lesions  Lymph nodes:   Cervical, supraclavicular, and axillary nodes normal  Neurologic:   CNII-XII intact, normal strength, sensation and reflexes    throughout   Results for orders placed or performed in visit on 08/15/19  Hepatic function panel  Result Value Ref Range   Total Bilirubin 0.4 0.2 - 1.2 mg/dL   Bilirubin, Direct 0.1 0.0 - 0.3 mg/dL   Alkaline Phosphatase 36 (L) 39 - 117 U/L   AST 19 0 - 37 U/L   ALT 28 0 - 35 U/L   Total Protein 7.2 6.0 - 8.3 g/dL   Albumin 4.3 3.5 - 5.2 g/dL  TSH  Result Value Ref Range   TSH 1.61 0.35 - 4.50 uIU/mL  CBC with Differential/Platelet  Result Value Ref Range   WBC 9.5 4.0 - 10.5 K/uL   RBC 5.06 3.87 - 5.11 Mil/uL   Hemoglobin 13.5 12.0 - 15.0 g/dL   HCT 15.1 36 - 46 %   MCV 81.1 78.0 - 100.0 fl   MCHC 32.9 30.0 - 36.0 g/dL   RDW 76.1 60.7 - 37.1 %   Platelets 355.0 150 - 400 K/uL    Neutrophils Relative % 67.3 43 - 77 %   Lymphocytes Relative 25.0 12 - 46 %   Monocytes Relative 6.4 3 - 12 %   Eosinophils Relative 0.8 0 - 5 %   Basophils Relative 0.5 0 - 3 %   Neutro Abs 6.4 1.4 -  7.7 K/uL   Lymphs Abs 2.4 0.7 - 4.0 K/uL   Monocytes Absolute 0.6 0 - 1 K/uL   Eosinophils Absolute 0.1 0 - 0 K/uL   Basophils Absolute 0.0 0 - 0 K/uL    Assessment/Plan:   There are no diagnoses linked to this encounter.  Well Adult Exam: Labs ordered: {Yes/No:18319::"Yes"}. Patient counseling was done. See below for items discussed. Discussed the patient's BMI. The {BMI plan (MU NQF measure 421):19504} Follow up {follow-up interval:13814}.  Patient Counseling:   [x]     Nutrition: Stressed importance of moderation in sodium/caffeine intake, saturated fat and cholesterol, caloric balance, sufficient intake of fresh fruits, vegetables, fiber, calcium, iron, and 1 mg of folate supplement per day (for females capable of pregnancy).   [x]      Stressed the importance of regular exercise.    [x]     Substance Abuse: Discussed cessation/primary prevention of tobacco, alcohol, or other drug use; driving or other dangerous activities under the influence; availability of treatment for abuse.    [x]      Injury prevention: Discussed safety belts, safety helmets, smoke detector, smoking near bedding or upholstery.    [x]      Sexuality: Discussed sexually transmitted diseases, partner selection, use of condoms, avoidance of unintended pregnancy  and contraceptive alternatives.    [x]     Dental health: Discussed importance of regular tooth brushing, flossing, and dental visits.   [x]      Health maintenance and immunizations reviewed. Please refer to Health maintenance section.   ***  , PA-C [facility]

## 2019-10-07 NOTE — Patient Instructions (Signed)
It was great to see you!  You got a steroid injection today to see if this can help calm your allergies down.  Switch from Allegra to Xyzal (levocetirizine) to see if this helps your symptoms   May use over the counter antihistamines such as Zyrtec (cetirizine), Claritin (loratadine), Allegra (fexofenadine), or Xyzal (levocetirizine) daily as needed. May take twice a day if needed as long as it does not cause drowsiness.   May try Singulair 10mg  daily at night.   Continue Flonase 1 spray twice a day.  Nasal saline spray (i.e., Simply Saline) or nasal saline lavage (i.e., NeilMed) is recommended as needed and prior to medicated nasal sprays.   Take care,  PA-C

## 2019-10-16 ENCOUNTER — Encounter: Payer: Self-pay | Admitting: *Deleted

## 2019-10-16 ENCOUNTER — Other Ambulatory Visit: Payer: Self-pay | Admitting: *Deleted

## 2019-11-05 ENCOUNTER — Ambulatory Visit (INDEPENDENT_AMBULATORY_CARE_PROVIDER_SITE_OTHER): Payer: 59 | Admitting: Nurse Practitioner

## 2019-11-05 ENCOUNTER — Encounter: Payer: Self-pay | Admitting: Nurse Practitioner

## 2019-11-05 VITALS — BP 128/76 | HR 100 | Ht 65.5 in | Wt 255.2 lb

## 2019-11-05 DIAGNOSIS — R140 Abdominal distension (gaseous): Secondary | ICD-10-CM | POA: Diagnosis not present

## 2019-11-05 DIAGNOSIS — K5909 Other constipation: Secondary | ICD-10-CM

## 2019-11-05 MED ORDER — LINACLOTIDE 145 MCG PO CAPS: 145.0000 ug | ORAL_CAPSULE | Freq: Every day | ORAL | 6 refills | Status: DC

## 2019-11-05 NOTE — Patient Instructions (Signed)
We have sent the following medications to your pharmacy for you to pick up at your convenience:  Linzess  Please follow up with Gunnar Fusi on 12/05/19 at 2:00pm.

## 2019-11-05 NOTE — Progress Notes (Signed)
ASSESSMENT AND PLAN    # Chronic constipation with decreased frequency of hard stools.  --Slow transit constipation. May go two weeks without bowel movement   --Linzess 145 mcg samples helped when she took them but didn't take them everyday. She is agreeable to trying Linzess again, this time taking it every am on an empty stomach.  --follow up with me in a few weeks  --Consider Motegrity if fails Linzess  # Frequent bloating, especially postprandially. Also complains of build up of intestinal gas which is difficult to pass --Low gas diet given --Need to treat constipation as it can exacerbate symptoms.Marland Kitchen  --If no improvement consider SIBO testing / treatment.   HISTORY OF PRESENT ILLNESS     Primary Gastroenterologist : Ileene Patrick, MD  Chief Complaint : follow up on constipation.   Dawn [name] is a 39 y.o. female with PMH / PSH significant for,  but not necessarily limited to: retinopathy, chronic constipation,  appendectomy  Patient saw Dr. Adela Lank in July with constipation and bloating. She had already tried Miralax twice daily and other OTC agents but they stopped working.  Dr. Adela Lank gave her samples of Linzess. She is here for follow up.   Interval History:  Patient was able to have a BM after taking a dose of Linzess. However, she didn't realize that medication needed to be taken everyday. She took a dose every few days instead.   She tries to eat a high fiber diet and drinks over a 100 ounces of water a day. She says her constipation dates back to 2016 when she had a hysterectomy. . She saw a pelvic floor therpist a few years back and was given some maneuvers to help facilitate passage of stool but didn't find it helpful.  In addition to decreased frequency of bowel movements patient has chronic bloating. Abdomen often distended after meals. she doesn't ever pass much gass. The gas just moves around in her intestine.   Normal TSH in July 2021   Past Medical  History:  Diagnosis Date  . Allergy   . Autoimmune retinopathy (HCC)   . Carpal tunnel syndrome of left wrist   . Chorioretinitis and choroiditis, disseminated, generalized   . Dysmenorrhea   . Family history of rheumatoid arthritis   . PONV (postoperative nausea and vomiting)   . Vaginal delivery 2004  . Vitamin D deficiency     Current Medications, Allergies, Past Surgical History, Family History and Social History were reviewed in [employer] record.   Current Outpatient Medications  Medication Sig Dispense Refill  . Azelastine HCl 137 MCG/SPRAY SOLN Place 1 spray into the nose in the morning and at bedtime.     . ergocalciferol (VITAMIN D2) 1.25 MG (50000 UT) capsule Take 1 capsule by mouth once a week.    . fluticasone (FLONASE) 50 MCG/ACT nasal spray Place 2 sprays into both nostrils daily.     Marland Kitchen levocetirizine (XYZAL) 5 MG tablet Take 5 mg by mouth daily.    . montelukast (SINGULAIR) 10 MG tablet Take 10 mg by mouth daily.     . valACYclovir (VALTREX) 500 MG tablet Take 500 mg by mouth 2 (two) times daily as needed.     Marland Kitchen EPINEPHrine 0.3 mg/0.3 mL IJ SOAJ injection Inject 0.3 mg into the muscle once as needed (severe allergic reaction).  (Patient not taking: Reported on 11/05/2019)     No current facility-administered medications for this visit.    Review of Systems:  No chest pain. No shortness of breath. No urinary complaints.   PHYSICAL EXAM :    Wt Readings from Last 3 Encounters:  11/05/19 255 lb 4 oz (115.8 kg)  10/07/19 252 lb 4 oz (114.4 kg)  08/23/19 252 lb 4 oz (114.4 kg)    BP 128/76 (BP Location: Left Arm, Patient Position: Sitting, Cuff Size: Large)   Pulse 100   Ht 5' 5.5" (1.664 m) Comment: height measured without shoes  Wt 255 lb 4 oz (115.8 kg)   LMP 06/25/2014 (Approximate)   BMI 41.83 kg/m  Constitutional:  Pleasant female in no acute distress. Psychiatric: Normal mood and affect. Behavior is normal. EENT: Pupils normal.   Conjunctivae are normal. No scleral icterus. Neck supple.  Cardiovascular: Normal rate, regular rhythm. No edema Pulmonary/chest: Effort normal and breath sounds normal. No wheezing, rales or rhonchi. Abdominal: Soft, nondistended, nontender. Bowel sounds active throughout. There are no masses palpable. No hepatomegaly. Neurological: Alert and oriented to person place and time. Skin: Skin is warm and dry. No rashes noted.  Willette Cluster, NP  11/05/2019, 2:47 PM

## 2019-11-06 NOTE — Progress Notes (Signed)
Agree with assessment and plan as outlined.  

## 2019-11-20 ENCOUNTER — Ambulatory Visit (INDEPENDENT_AMBULATORY_CARE_PROVIDER_SITE_OTHER): Payer: 59 | Admitting: Advanced Practice Midwife

## 2019-11-20 ENCOUNTER — Other Ambulatory Visit (HOSPITAL_COMMUNITY)
Admission: RE | Admit: 2019-11-20 | Discharge: 2019-11-20 | Disposition: A | Discharge status: Home / Self Care | Payer: 59 | Source: Ambulatory Visit | Attending: Advanced Practice Midwife | Admitting: Advanced Practice Midwife

## 2019-11-20 ENCOUNTER — Encounter: Payer: Self-pay | Admitting: Advanced Practice Midwife

## 2019-11-20 ENCOUNTER — Other Ambulatory Visit: Payer: Self-pay

## 2019-11-20 VITALS — BP 143/86 | HR 108

## 2019-11-20 DIAGNOSIS — N898 Other specified noninflammatory disorders of vagina: Secondary | ICD-10-CM

## 2019-11-20 MED ORDER — METRONIDAZOLE 500 MG PO TABS
500.0000 mg | ORAL_TABLET | Freq: Two times a day (BID) | ORAL | 0 refills | Status: DC
Start: 2019-11-20 — End: 2020-02-27

## 2019-11-20 NOTE — Progress Notes (Signed)
GYNECOLOGY PROBLEM NOTE  History:     Dawn [name] is a 39 y.o. [phone] female here for evaluation of vaginal irritation and abnormal vaginal discharge. Denies abnormal vaginal bleeding, pelvic pain, problems with intercourse or other gynecologic concerns.    Gynecologic History Patient's last menstrual period was 06/25/2014 (approximate). Contraception: status post hysterectomy Last Pap: 03/2016 . Results were: NIL with positive HPV  Obstetric History OB History  Gravida Para Term Preterm AB Living  3 1 1   2 1   SAB TAB Ectopic Multiple Live Births  2       1    # Outcome Date GA Lbr Len/2nd Weight Sex Delivery Anes PTL Lv  3 SAB 2014     SAB     2 SAB 2013     SAB     1 Term 03/05/02 [redacted]w[redacted]d  7 lb (3.175 kg) F Vag-Spont EPI N LIV    Past Medical History:  Diagnosis Date  . Allergy   . Autoimmune retinopathy (HCC)   . Carpal tunnel syndrome of left wrist   . Chorioretinitis and choroiditis, disseminated, generalized   . Dysmenorrhea   . Family history of rheumatoid arthritis   . PONV (postoperative nausea and vomiting)   . Vaginal delivery 2004  . Vitamin D deficiency     Past Surgical History:  Procedure Laterality Date  . APPENDECTOMY N/A 12/13/2016   Procedure: POSSIBLE APPENDECTOMY;  Surgeon: 13/07/2016, MD;  Location: [facility] OR;  Service: General;  Laterality: N/A;  . BREAST REDUCTION SURGERY Bilateral 08/28/2014   Procedure: BILATERAL BREAST  REDUCTION  ;  Surgeon: 08/30/2014, MD;  Location: [facility] OR;  Service: Plastics;  Laterality: Bilateral;  . CARPAL TUNNEL RELEASE Left 2019  . LAPAROSCOPY N/A 02/18/2013   Procedure: LAPAROSCOPY OPERATIVE;  Surgeon: 04/18/2013, MD;  Location: [facility] ORS;  Service: Gynecology;  Laterality: N/A;  YAG LASER  . LAPAROTOMY N/A 12/13/2016   Procedure: DIAGNOSTIC LAPAROTOMY;  Surgeon: 13/07/2016, MD;  Location: [facility] OR;  Service: General;  Laterality: N/A;  . REDUCTION MAMMAPLASTY Bilateral 2016  . TONSILLECTOMY  2008  . VAGINAL  HYSTERECTOMY  06-26-14  . YAG LASER APPLICATION N/A 02/18/2013   Procedure: YAG LASER APPLICATION;  Surgeon: 04/18/2013, MD;  Location: [facility] ORS;  Service: Gynecology;  Laterality: N/A;    Current Outpatient Medications on File Prior to Visit  Medication Sig Dispense Refill  . Azelastine HCl 137 MCG/SPRAY SOLN Place 1 spray into the nose in the morning and at bedtime.     Levi Aland EPINEPHrine 0.3 mg/0.3 mL IJ SOAJ injection Inject 0.3 mg into the muscle once as needed (severe allergic reaction).  (Patient not taking: Reported on 11/05/2019)    . ergocalciferol (VITAMIN D2) 1.25 MG (50000 UT) capsule Take 1 capsule by mouth once a week.    . fluticasone (FLONASE) 50 MCG/ACT nasal spray Place 2 sprays into both nostrils daily.     11/07/2019 levocetirizine (XYZAL) 5 MG tablet Take 5 mg by mouth daily.    Marland Kitchen linaclotide (LINZESS) 145 MCG CAPS capsule Take 1 capsule (145 mcg total) by mouth daily before breakfast. 30 capsule 6  . montelukast (SINGULAIR) 10 MG tablet Take 10 mg by mouth daily.     . polyethylene glycol (MIRALAX) 17 g packet Take 17 g by mouth 2 (two) times daily. 1 each 0  . valACYclovir (VALTREX) 500 MG tablet Take 500 mg by mouth 2 (two) times daily as needed.  No current facility-administered medications on file prior to visit.    Allergies  Allergen Reactions  . Claritin [Loratadine] Other (See Comments)    Causes hands to shake Tremors  . Other Anaphylaxis    Foods:  Skittles, donuts (all).   Some Makeup.    . Diflucan [Fluconazole] Rash    On upper lip    Social History:  reports that she has never smoked. She has never used smokeless tobacco. She reports current alcohol use. She reports that she does not use drugs.  Family History  Problem Relation Age of Onset  . Hyperlipidemia Mother   . Hypertension Mother   . Allergic rhinitis Mother   . Diabetes Mother   . Kidney disease Father   . Asthma Sister   . Allergic rhinitis Sister   . Allergic rhinitis Brother   .  Asthma Paternal Uncle   . Prostate cancer Paternal Uncle   . Canavan disease Paternal Grandfather        unknown type  . Cancer Paternal Grandfather   . Colon cancer Neg Hx   . Esophageal cancer Neg Hx   . Pancreatic cancer Neg Hx   . Stomach cancer Neg Hx   . Liver disease Neg Hx     The following portions of the patient's history were reviewed and updated as appropriate: allergies, current medications, past family history, past medical history, past social history, past surgical history and problem list.  Review of Systems Pertinent items noted in HPI and remainder of comprehensive ROS otherwise negative.  Physical Exam:  BP (!) 143/86   Pulse (!) 108   LMP 06/25/2014 (Approximate)  CONSTITUTIONAL: Well-developed, well-nourished female in no acute distress.  HENT:  Normocephalic, atraumatic, External right and left ear normal. Oropharynx is clear and moist EYES: Conjunctivae and EOM are normal. Pupils are equal, round, and reactive to light. No scleral icterus.  NECK: Normal range of motion, supple, no masses.  Normal thyroid.  SKIN: Skin is warm and dry. No rash noted. Not diaphoretic. No erythema. No pallor. PELVIC: Normal appearing external genitalia and urethral meatus; normal appearing vaginal mucosa and cervix.  Thin white discharge on labia and introitus. Performed in the presence of a chaperone.   Assessment and Plan:    1. Vaginal irritation - Treat for BV, swab pending - Cervicovaginal ancillary only  Pap due Routine preventative health maintenance measures emphasized. Please refer to After Visit Summary for other counseling recommendations.      Clayton Bibles, MSN, CNM Certified Nurse Midwife, [employer] for [employer], [facility] 11/20/19 4:40 PM

## 2019-11-21 ENCOUNTER — Other Ambulatory Visit: Payer: Self-pay

## 2019-11-21 ENCOUNTER — Encounter: Payer: Self-pay | Admitting: Physician Assistant

## 2019-11-21 ENCOUNTER — Ambulatory Visit (INDEPENDENT_AMBULATORY_CARE_PROVIDER_SITE_OTHER): Payer: 59

## 2019-11-21 DIAGNOSIS — Z23 Encounter for immunization: Secondary | ICD-10-CM | POA: Diagnosis not present

## 2019-11-22 LAB — CERVICOVAGINAL ANCILLARY ONLY
Bacterial Vaginitis (gardnerella): POSITIVE — AB
Candida Glabrata: NEGATIVE
Candida Vaginitis: NEGATIVE
Chlamydia: NEGATIVE
Comment: NEGATIVE
Comment: NEGATIVE
Comment: NEGATIVE
Comment: NEGATIVE
Comment: NEGATIVE
Comment: NORMAL
Neisseria Gonorrhea: NEGATIVE
Trichomonas: NEGATIVE

## 2019-12-05 ENCOUNTER — Ambulatory Visit (INDEPENDENT_AMBULATORY_CARE_PROVIDER_SITE_OTHER): Payer: 59 | Admitting: Nurse Practitioner

## 2019-12-05 ENCOUNTER — Encounter: Payer: Self-pay | Admitting: Nurse Practitioner

## 2019-12-05 VITALS — BP 188/60 | HR 84 | Ht 65.5 in | Wt 251.0 lb

## 2019-12-05 DIAGNOSIS — K5909 Other constipation: Secondary | ICD-10-CM

## 2019-12-05 MED ORDER — LUBIPROSTONE 8 MCG PO CAPS: 8.0000 ug | ORAL_CAPSULE | Freq: Two times a day (BID) | ORAL | 2 refills | Status: DC

## 2019-12-05 NOTE — Progress Notes (Signed)
Agree with assessment and plan as outlined.  

## 2019-12-05 NOTE — Patient Instructions (Addendum)
If you are age 39 or older, your body mass index should be between 23-30. Your Body mass index is 41.13 kg/m. If this is out of the aforementioned range listed, please consider follow up with your Primary Care Provider.  If you are age 53 or younger, your body mass index should be between 19-25. Your Body mass index is 41.13 kg/m. If this is out of the aformentioned range listed, please consider follow up with your Primary Care Provider.   We have sent the following medications to your pharmacy for you to pick up at your convenience:  START: Amitiza take one capsule twice daily  STOP: Linzess  Please call us in a few weeks to let us know how you are feeling. Also, call us if your insurance will not cover Amitiza.  Thank you for entrusting me with your care and choosing [facility].  Willette Cluster, NP

## 2019-12-05 NOTE — Progress Notes (Signed)
ASSESSMENT AND PLAN    # Chronic constipation. BMs consistently loose on daily Linzess despite taking it on an empty stomach --Will change to Amitiza 8 mcg twice daily.  Hopefully insurance will pay for the Amitiza.  If not then will see if another medication such as Trulance is covered. If we are limited to Linzess then can decrease dose to 72 mcg daily and see how that works    HISTORY OF PRESENT ILLNESS     Primary Gastroenterologist : Ileene Patrick, MD  Chief Complaint : constipation follow up  Dawn [name] is a 39 y.o. female with PMH / PSH significant for,  but not necessarily limited to:  Chronic constipation, autoimmune retinopathy, fatty liver disease, appendectomy  I saw Dawn [name] several weeks ago for bloating / constipation with decreased frequency of hard stool. She had seen a pelvic floor therapist several years ago but did not find it helpful. She had failed BID Miralax. In July Dr. Adela Lank gave her samples of Linzess which helped but she took it only as needed.  She agreed to take the linzess every day going forward  Interval History:  Dawn [name] has been taking Linzess every day since I saw her several weeks ago.  Though she has a bowel movement every day, her stools are consistently loose. She is happy to be having a daily bowel movement but does wish for more formed stools. Otherwise from a GI standpoint she feels fine. On Flagyl for history of BV .  Previous Endoscopic Evaluations / Pertinent Studies: \ March 2015 colonoscopy in [place] for abdominal pain --2 small hyperplastic polyps  Past Medical History:  Diagnosis Date  . Allergy   . Autoimmune retinopathy (HCC)   . Carpal tunnel syndrome of left wrist   . Chorioretinitis and choroiditis, disseminated, generalized   . Dysmenorrhea   . Family history of rheumatoid arthritis   . PONV (postoperative nausea and vomiting)   . Vaginal delivery 2004  . Vitamin D deficiency     Current Medications,  Allergies, Past Surgical History, Family History and Social History were reviewed in [employer] record.   Current Outpatient Medications  Medication Sig Dispense Refill  . Azelastine HCl 137 MCG/SPRAY SOLN Place 1 spray into the nose in the morning and at bedtime.     . ergocalciferol (VITAMIN D2) 1.25 MG (50000 UT) capsule Take 1 capsule by mouth once a week.    . fluticasone (FLONASE) 50 MCG/ACT nasal spray Place 2 sprays into both nostrils daily.     Marland Kitchen levocetirizine (XYZAL) 5 MG tablet Take 5 mg by mouth daily.    . metroNIDAZOLE (FLAGYL) 500 MG tablet Take 1 tablet (500 mg total) by mouth 2 (two) times daily. 14 tablet 0  . montelukast (SINGULAIR) 10 MG tablet Take 10 mg by mouth daily.     . valACYclovir (VALTREX) 500 MG tablet Take 500 mg by mouth 2 (two) times daily as needed.     Marland Kitchen EPINEPHrine 0.3 mg/0.3 mL IJ SOAJ injection Inject 0.3 mg into the muscle once as needed (severe allergic reaction).  (Patient not taking: Reported on 11/05/2019)    . lubiprostone (AMITIZA) 8 MCG capsule Take 1 capsule (8 mcg total) by mouth 2 (two) times daily with a meal. 60 capsule 2   No current facility-administered medications for this visit.    Review of Systems: No chest pain. No shortness of breath. No urinary complaints.   PHYSICAL EXAM :    Wt Readings  from Last 3 Encounters:  12/05/19 251 lb (113.9 kg)  11/05/19 255 lb 4 oz (115.8 kg)  10/07/19 252 lb 4 oz (114.4 kg)    BP (!) 188/60   Pulse 84   Ht 5' 5.5" (1.664 m)   Wt 251 lb (113.9 kg)   LMP 06/25/2014 (Approximate)   SpO2 97%   BMI 41.13 kg/m  Constitutional:  Pleasant female in no acute distress. Psychiatric: Normal mood and affect. Behavior is normal. Cardiovascular: Normal rate, regular rhythm.  Pulmonary/chest: Effort normal and breath sounds normal. No wheezing, rales or rhonchi. Abdominal: Soft, nondistended, nontender. Bowel sounds active throughout. There are no masses palpable.    Neurological: Alert and oriented to person place and time. Willette Cluster, NP  12/05/2019, 3:06 PM

## 2019-12-06 ENCOUNTER — Telehealth: Payer: Self-pay

## 2019-12-06 MED ORDER — LACTULOSE 10 G PO PACK: 30.0000 g | PACK | Freq: Every day | ORAL | 0 refills | Status: DC

## 2019-12-06 NOTE — Telephone Encounter (Signed)
CoverMyMeds sent fax stating that Amitiza 8 mcg twice daily is not covered by insurance without a prior auth. Patient has only tried Linzess in the past.  She will need to have tried and failed two medications.  Per verbal order from [place], patient can try Lactulose 30 grams daily.  Patient aware of these changes and will pick up Lactulose at pharmacy. Patient agreed to plan and verbalized understanding. No further questions.

## 2019-12-09 MED ORDER — LACTULOSE 10 GM/15ML PO SOLN: 30.0000 g | Freq: Every day | ORAL | 0 refills | Status: AC

## 2019-12-09 NOTE — Addendum Note (Signed)
Addended by: Lamona Curl on: 12/09/2019 03:25 PM   Modules accepted: Orders

## 2019-12-09 NOTE — Telephone Encounter (Signed)
Lactulose was not available in the packet in name brand or generic.  Rx requested to be sent in as liquid form.  Rx for liquid sent to pharmacy as requested.

## 2020-02-27 ENCOUNTER — Telehealth (INDEPENDENT_AMBULATORY_CARE_PROVIDER_SITE_OTHER): Payer: 59 | Admitting: Physician Assistant

## 2020-02-27 ENCOUNTER — Encounter: Payer: Self-pay | Admitting: Physician Assistant

## 2020-02-27 ENCOUNTER — Other Ambulatory Visit: Payer: Self-pay

## 2020-02-27 ENCOUNTER — Other Ambulatory Visit: Payer: 59

## 2020-02-27 VITALS — Temp 97.4°F | Ht 65.5 in | Wt 247.0 lb

## 2020-02-27 DIAGNOSIS — Z20822 Contact with and (suspected) exposure to covid-19: Secondary | ICD-10-CM

## 2020-02-27 DIAGNOSIS — R0981 Nasal congestion: Secondary | ICD-10-CM

## 2020-02-27 MED ORDER — DOXYCYCLINE HYCLATE 100 MG PO TABS: 100.0000 mg | ORAL_TABLET | Freq: Two times a day (BID) | ORAL | 0 refills | Status: DC

## 2020-02-27 MED ORDER — METRONIDAZOLE 500 MG PO TABS: 500.0000 mg | ORAL_TABLET | Freq: Two times a day (BID) | ORAL | 0 refills | Status: DC

## 2020-02-27 NOTE — Progress Notes (Signed)
Virtual Visit via Video   I connected with Dawn [name] on 02/27/20 at  9:00 AM EST by a video enabled telemedicine application and verified that I am speaking with the correct person using two identifiers. Location patient: Home Location provider: [facility], Office Persons participating in the virtual visit: Krystiana Fornes, Jarold Motto PA-C, Corky Mull, LPN   I discussed the limitations of evaluation and management by telemedicine and the availability of in person appointments. The patient expressed understanding and agreed to proceed.   Subjective:   HPI:   Patient is requesting evaluation for possible COVID-19.  Symptom onset: Saturday  Travel/contacts: No  Vaccination status: Complete  Testing results: None  Patient endorses the following symptoms: Headache, nasal congestion, sinus pressure above eyes  Patient denies the following symptoms: Fever or chills  Treatments tried: Ibuprofen, Allegra, Mucinex, nasal sprays   Patient risk factors: Current COVID-19 risk of complications score: 2 Smoking status: Danesha Kirchoff  reports that she has never smoked. She has never used smokeless tobacco. If female, currently pregnant? []   Yes [x]   No  ROS: See pertinent positives and negatives per HPI.  Patient Active Problem List   Diagnosis Date Noted  . Class 3 severe obesity in adult [name]) 10/10/2018  . 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 Status post laparoscopic appendectomy 12/26/2016  . Abdominal pain 12/12/2016  . Well woman exam 03/15/2016  . Possible exposure to STD 03/15/2016  . Allergic reactions 01/05/2016  . Seasonal and perennial allergic rhinitis 01/05/2016  . Nasal polyps 01/05/2016  . Essential (primary) hypertension 08/25/2015  . Stroke-like symptoms 05/25/2015  . Cephalalgia   . Essential hypertension   . Breast hypertrophy in female 08/28/2014  . Obesity 04/03/2014  . Back pain 04/03/2014  . Chronic sinusitis 04/03/2014  . Endometriosis  determined by laparoscopy 04/03/2014  . Recurrent headache 11/08/2012  . Genital herpes 10/25/2011  . Avitaminosis D 09/05/2011  . Acid reflux 08/05/2011  . Adult BMI 30+ 08/05/2011  . Gastroesophageal reflux disease 08/05/2011    Social History   Tobacco Use  . Smoking status: Never Smoker  . Smokeless tobacco: Never Used  Substance Use Topics  . Alcohol use: Yes    Comment: 1 drink per month    Current Outpatient Medications:  .  Azelastine HCl 137 MCG/SPRAY SOLN, Place 1 spray into the nose in the morning and at bedtime. , Disp: , Rfl:  .  EPINEPHrine 0.3 mg/0.3 mL IJ SOAJ injection, Inject 0.3 mg into the muscle once as needed (severe allergic reaction)., Disp: , Rfl:  .  fluticasone (FLONASE) 50 MCG/ACT nasal spray, Place 2 sprays into both nostrils daily. , Disp: , Rfl:  .  levocetirizine (XYZAL) 5 MG tablet, Take 5 mg by mouth daily., Disp: , Rfl:  .  montelukast (SINGULAIR) 10 MG tablet, Take 10 mg by mouth daily. , Disp: , Rfl:  .  polyethylene glycol (MIRALAX) 17 g packet, Take 17 g by mouth 2 (two) times daily., Disp: 1 each, Rfl: 0 .  valACYclovir (VALTREX) 500 MG tablet, Take 500 mg by mouth 2 (two) times daily as needed. , Disp: , Rfl:   Allergies  Allergen Reactions  . Claritin [Loratadine] Other (See Comments)    Causes hands to shake Tremors  . Other Anaphylaxis    Foods:  Skittles, donuts (all).   Some Makeup.    . Diflucan [Fluconazole] Rash    On upper lip    Objective:   VITALS: Per patient if applicable, see vitals. GENERAL:  Alert, appears well and in no acute distress. HEENT: Atraumatic, conjunctiva clear, no obvious abnormalities on inspection of external nose and ears. NECK: Normal movements of the head and neck. CARDIOPULMONARY: No increased WOB. Speaking in clear sentences. I:E ratio WNL.  MS: Moves all visible extremities without noticeable abnormality. PSYCH: Pleasant and cooperative, well-groomed. Speech normal rate and rhythm. Affect is  appropriate. Insight and judgement are appropriate. Attention is focused, linear, and appropriate.  NEURO: CN grossly intact. Oriented as arrived to appointment on time with no prompting. Moves both UE equally.  SKIN: No obvious lesions, wounds, erythema, or cyanosis noted on face or hands.  Assessment and Plan:   Kelseigh was seen today for covid symtoms.  Diagnoses and all orders for this visit:  Sinus congestion   No red flags on discussion, patient is not in any obvious distress during our visit. Discussed progression of most viral illnesses, and recommended supportive care at this point in time.  I did however provide pocket rx for oral doxycycline should symptoms not improve as anticipated. She states that she also gets BV after antibiotics, and would like a round of flagyl should she need it. She is also agreeable to COVID testing and verbalized understanding to not take antibiotics if she has a positive result. Discussed over the counter supportive care options, including Tylenol 500 mg q 8 hours, with recommendations to push fluids and rest. Reviewed return precautions including new/worsening fever, SOB, new/worsening cough, sudden onset changes of symptoms. Recommended need to self-quarantine and practice social distancing until symptoms resolve. I recommend that patient follow-up if symptoms worsen or persist despite treatment x 7-10 days, sooner if needed.  I discussed the assessment and treatment plan with the patient. The patient was provided an opportunity to ask questions and all were answered. The patient agreed with the plan and demonstrated an understanding of the instructions.   The patient was advised to call back or seek an in-person evaluation if the symptoms worsen or if the condition fails to improve as anticipated.   CMA or LPN served as scribe during this visit. History, Physical, and Plan performed by medical provider. The above documentation has been reviewed and  is accurate and complete.  [place], [place] 02/27/2020

## 2020-02-29 LAB — SARS-COV-2, NAA 2 DAY TAT

## 2020-02-29 LAB — NOVEL CORONAVIRUS, NAA: SARS-CoV-2, NAA: DETECTED — AB

## 2020-04-13 ENCOUNTER — Other Ambulatory Visit: Payer: Self-pay

## 2020-04-13 ENCOUNTER — Ambulatory Visit: Payer: 59 | Admitting: Physician Assistant

## 2020-04-13 ENCOUNTER — Encounter: Payer: Self-pay | Admitting: Physician Assistant

## 2020-04-13 DIAGNOSIS — M542 Cervicalgia: Secondary | ICD-10-CM

## 2020-04-13 DIAGNOSIS — M546 Pain in thoracic spine: Secondary | ICD-10-CM

## 2020-04-13 MED ORDER — CYCLOBENZAPRINE HCL 10 MG PO TABS: 10.0000 mg | ORAL_TABLET | Freq: Three times a day (TID) | ORAL | 0 refills | Status: DC | PRN

## 2020-04-13 MED ORDER — DICLOFENAC SODIUM 75 MG PO TBEC: 75.0000 mg | DELAYED_RELEASE_TABLET | Freq: Two times a day (BID) | ORAL | 0 refills | Status: DC
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
## 2020-04-13 NOTE — Patient Instructions (Signed)
It was great to see you!  Suspect muscle strain however I do want to get an xray of your neck and upper back to make sure there is nothing going on.  May take diclofenac 75 mg twice daily. This is an anti-inflammatory and will replace and ibuprofen you may need to take while on this.  May take flexeril 10 mg three times daily.  Start with 1/2 tablet as needed to see if this can help provide improvement in your muscle spasms. Avoid increasing if causes excessive sedation.  If any new or worsening symptoms, please follow-up with me ASAP.  Take care,  Jarold Motto PA-C

## 2020-04-14 ENCOUNTER — Other Ambulatory Visit: Payer: 59

## 2020-04-14 ENCOUNTER — Encounter: Payer: Self-pay | Admitting: Physician Assistant

## 2020-04-14 ENCOUNTER — Ambulatory Visit (INDEPENDENT_AMBULATORY_CARE_PROVIDER_SITE_OTHER): Payer: 59

## 2020-04-14 DIAGNOSIS — M542 Cervicalgia: Secondary | ICD-10-CM | POA: Diagnosis not present

## 2020-04-14 DIAGNOSIS — M546 Pain in thoracic spine: Secondary | ICD-10-CM | POA: Diagnosis not present

## 2020-04-17 ENCOUNTER — Telehealth: Payer: Self-pay

## 2020-04-17 NOTE — Telephone Encounter (Signed)
Gave message below and patient states she will call back if not improving!

## 2020-04-17 NOTE — Telephone Encounter (Signed)
Patient is calling in wondering if her x-ray results have come back, requesting a phone call.

## 2020-04-17 NOTE — Telephone Encounter (Signed)
Please see message and advise 

## 2020-04-17 NOTE — Telephone Encounter (Signed)
Your x-rays are normal.  If your symptoms do not continue to improve then I would like for you to see one of our physical therapists.  Please let us know.   Written by Jarold Motto, PA on 04/14/2020  4:48 PM EST

## 2020-04-17 NOTE — Telephone Encounter (Signed)
Left message on voicemail calling about report. My chart message was sent if any questions please call on Monday.

## 2020-04-20 NOTE — Telephone Encounter (Signed)
Noted  

## 2020-06-23 ENCOUNTER — Other Ambulatory Visit: Payer: Self-pay | Admitting: *Deleted

## 2020-06-23 MED ORDER — VALACYCLOVIR HCL 500 MG PO TABS: ORAL_TABLET | ORAL | 1 refills | Status: DC

## 2020-06-23 NOTE — Telephone Encounter (Signed)
Spoke to pt told her calling regarding fax received from pharmacy for refill on Valtrex. Asked pt if she is taking 1 or 2 tablets daily? Pt said she is taking 1 daily and takes 2 a day x 3 days for outbreaks. Told her okay will send 90 day supply to pharmacy for her. Pt verbalized understanding.  Called pt back and told her I put directions on bottle as she takes it daily and for outbreaks. Also Lelon Mast is out of the office and Rx will be under Dr. Lavone Neri name. Pt verbalized understanding. Rx sent.

## 2020-09-16 ENCOUNTER — Ambulatory Visit: Payer: 59 | Admitting: Physician Assistant

## 2020-09-16 NOTE — Progress Notes (Deleted)
Dawn [name] is a 40 y.o. female here for a new problem.  I acted as a Neurosurgeon for [employer], PA-C [employer], LPN   History of Present Illness:   No chief complaint on file.   HPI  Edema     Past Medical History:  Diagnosis Date   Allergy    Autoimmune retinopathy (HCC)    Carpal tunnel syndrome of left wrist    Chorioretinitis and choroiditis, disseminated, generalized    Dysmenorrhea    Family history of rheumatoid arthritis    PONV (postoperative nausea and vomiting)    Vaginal delivery 2004   Vitamin D deficiency      Social History   Tobacco Use   Smoking status: Never   Smokeless tobacco: Never  Vaping Use   Vaping Use: Never used  Substance Use Topics   Alcohol use: Yes    Comment: 1 drink per month   Drug use: No    Past Surgical History:  Procedure Laterality Date   APPENDECTOMY N/A 12/13/2016   Procedure: POSSIBLE APPENDECTOMY;  Surgeon: Griselda Miner, MD;  Location: [facility] OR;  Service: General;  Laterality: N/A;   BREAST REDUCTION SURGERY Bilateral 08/28/2014   Procedure: BILATERAL BREAST  REDUCTION  ;  Surgeon: Etter Sjogren, MD;  Location: [facility] OR;  Service: Plastics;  Laterality: Bilateral;   CARPAL TUNNEL RELEASE Left 2019   LAPAROSCOPY N/A 02/18/2013   Procedure: LAPAROSCOPY OPERATIVE;  Surgeon: Levi Aland, MD;  Location: [facility] ORS;  Service: Gynecology;  Laterality: N/A;  YAG LASER   LAPAROTOMY N/A 12/13/2016   Procedure: DIAGNOSTIC LAPAROTOMY;  Surgeon: Griselda Miner, MD;  Location: [facility] OR;  Service: General;  Laterality: N/A;   REDUCTION MAMMAPLASTY Bilateral 2016   TONSILLECTOMY  2008   VAGINAL HYSTERECTOMY  06-26-14   YAG LASER APPLICATION N/A 02/18/2013   Procedure: YAG LASER APPLICATION;  Surgeon: Levi Aland, MD;  Location: [facility] ORS;  Service: Gynecology;  Laterality: N/A;    Family History  Problem Relation Age of Onset   Hyperlipidemia Mother    Hypertension Mother    Allergic rhinitis Mother    Diabetes Mother    Kidney  disease Father    Asthma Sister    Allergic rhinitis Sister    Allergic rhinitis Brother    Asthma Paternal Uncle    Prostate cancer Paternal Uncle    Canavan disease Paternal Grandfather        unknown type   Cancer Paternal Grandfather    Colon cancer Neg Hx    Esophageal cancer Neg Hx    Pancreatic cancer Neg Hx    Stomach cancer Neg Hx    Liver disease Neg Hx     Allergies  Allergen Reactions   Claritin [Loratadine] Other (See Comments)    Causes hands to shake Tremors   Other Anaphylaxis    Foods:  Skittles, donuts (all).   Some Makeup.     Diflucan [Fluconazole] Rash    On upper lip    Current Medications:   Current Outpatient Medications:    Azelastine HCl 137 MCG/SPRAY SOLN, Place 1 spray into the nose in the morning and at bedtime. , Disp: , Rfl:    cyclobenzaprine (FLEXERIL) 10 MG tablet, Take 1 tablet (10 mg total) by mouth 3 (three) times daily as needed for muscle spasms., Disp: 30 tablet, Rfl: 0   diclofenac (VOLTAREN) 75 MG EC tablet, Take 1 tablet (75 mg total) by mouth 2 (two) times daily., Disp: 30 tablet,  Rfl: 0   EPINEPHrine 0.3 mg/0.3 mL IJ SOAJ injection, Inject 0.3 mg into the muscle once as needed (severe allergic reaction)., Disp: , Rfl:    fluticasone (FLONASE) 50 MCG/ACT nasal spray, Place 2 sprays into both nostrils daily. , Disp: , Rfl:    levocetirizine (XYZAL) 5 MG tablet, Take 5 mg by mouth daily., Disp: , Rfl:    montelukast (SINGULAIR) 10 MG tablet, Take 10 mg by mouth daily. , Disp: , Rfl:    polyethylene glycol (MIRALAX) 17 g packet, Take 17 g by mouth 2 (two) times daily., Disp: 1 each, Rfl: 0   valACYclovir (VALTREX) 500 MG tablet, Take 500 mg daily, for outbreak take 1000 mg daily x 3 days., Disp: 90 tablet, Rfl: 1   Review of Systems:   ROS  Vitals:   There were no vitals filed for this visit.   There is no height or weight on file to calculate BMI.  Physical Exam:   Physical Exam  Results for orders placed or performed in  visit on 02/27/20  Novel Coronavirus, NAA ([facility])   Specimen: Nasopharyngeal(NP) swabs in vial transport medium   Nasopharynge  Screenin  Result Value Ref Range   SARS-CoV-2, NAA Detected (A) Not Detected  SARS-COV-2, NAA 2 DAY TAT   Nasopharynge  Screenin  Result Value Ref Range   SARS-CoV-2, NAA 2 DAY TAT Performed     Assessment and Plan:   There are no diagnoses linked to this encounter.    ***  Jarold Motto, PA-C

## 2020-09-17 ENCOUNTER — Other Ambulatory Visit: Payer: Self-pay

## 2020-09-17 ENCOUNTER — Encounter: Payer: Self-pay | Admitting: Physician Assistant

## 2020-09-17 ENCOUNTER — Ambulatory Visit: Payer: 59 | Admitting: Physician Assistant

## 2020-09-17 VITALS — BP 115/70 | HR 96 | Temp 97.4°F | Ht 65.5 in | Wt 264.0 lb

## 2020-09-17 DIAGNOSIS — Z6841 Body Mass Index (BMI) 40.0 and over, adult: Secondary | ICD-10-CM | POA: Diagnosis not present

## 2020-09-17 DIAGNOSIS — R609 Edema, unspecified: Secondary | ICD-10-CM

## 2020-09-17 MED ORDER — FUROSEMIDE 20 MG PO TABS: 20.0000 mg | ORAL_TABLET | Freq: Every day | ORAL | 0 refills | Status: DC

## 2020-09-17 NOTE — Progress Notes (Signed)
Acute Office Visit  Subjective:    Patient ID: Dawn [name], female    DOB: 1980-07-10, 40 y.o.   MRN: [id number]  Chief Complaint  Patient presents with   Foot Swelling    HPI Patient is in today for bilateral foot and ankle swelling intermittent for the last few months. Standing, walking, first thing in the morning seems to make it worse. Elevation does seem to help, as well as compression socks. Epsom salt helps with pain.   No issues with her blood pressure. No CP or SOB. No dizziness or headaches. No orthopnea.  Past Medical History:  Diagnosis Date   Allergy    Autoimmune retinopathy (HCC)    Carpal tunnel syndrome of left wrist    Chorioretinitis and choroiditis, disseminated, generalized    Dysmenorrhea    Family history of rheumatoid arthritis    PONV (postoperative nausea and vomiting)    Vaginal delivery 2004   Vitamin D deficiency     Past Surgical History:  Procedure Laterality Date   APPENDECTOMY N/A 12/13/2016   Procedure: POSSIBLE APPENDECTOMY;  Surgeon: Griselda Miner, MD;  Location: [facility] OR;  Service: General;  Laterality: N/A;   BREAST REDUCTION SURGERY Bilateral 08/28/2014   Procedure: BILATERAL BREAST  REDUCTION  ;  Surgeon: Etter Sjogren, MD;  Location: [facility] OR;  Service: Plastics;  Laterality: Bilateral;   CARPAL TUNNEL RELEASE Left 2019   LAPAROSCOPY N/A 02/18/2013   Procedure: LAPAROSCOPY OPERATIVE;  Surgeon: Levi Aland, MD;  Location: [facility];  Service: Gynecology;  Laterality: N/A;  YAG LASER   LAPAROTOMY N/A 12/13/2016   Procedure: DIAGNOSTIC LAPAROTOMY;  Surgeon: Griselda Miner, MD;  Location: [facility] OR;  Service: General;  Laterality: N/A;   REDUCTION MAMMAPLASTY Bilateral 2016   TONSILLECTOMY  2008   VAGINAL HYSTERECTOMY  06-26-14   YAG LASER APPLICATION N/A 02/18/2013   Procedure: YAG LASER APPLICATION;  Surgeon: Levi Aland, MD;  Location: [facility] ORS;  Service: Gynecology;  Laterality: N/A;    Family History  Problem Relation Age of Onset    Hyperlipidemia Mother    Hypertension Mother    Allergic rhinitis Mother    Diabetes Mother    Kidney disease Father    Asthma Sister    Allergic rhinitis Sister    Allergic rhinitis Brother    Asthma Paternal Uncle    Prostate cancer Paternal Uncle    Canavan disease Paternal Grandfather        unknown type   Cancer Paternal Grandfather    Colon cancer Neg Hx    Esophageal cancer Neg Hx    Pancreatic cancer Neg Hx    Stomach cancer Neg Hx    Liver disease Neg Hx     Social History   Socioeconomic History   Marital status: Single    Spouse name: Not on file   Number of children: 1   Years of education: 12   Highest education level: Not on file  Occupational History   Occupation: Research scientist (physical sciences)    Employer: [employer]  Tobacco Use   Smoking status: Never   Smokeless tobacco: Never  Vaping Use   Vaping Use: Never used  Substance and Sexual Activity   Alcohol use: Yes    Comment: 1 drink per month   Drug use: No   Sexual activity: Yes    Birth control/protection: Surgical  Other Topics Concern   Not on file  Social History Educational psychologist and works for [employer]  36 yo daughter (2021)   Social Determinants of Corporate investment banker Strain: Not on file  Food Insecurity: Not on file  Transportation Needs: Not on file  Physical Activity: Not on file  Stress: Not on file  Social Connections: Not on file  Intimate Partner Violence: Not on file    Outpatient Medications Prior to Visit  Medication Sig Dispense Refill   Azelastine HCl 137 MCG/SPRAY SOLN Place 1 spray into the nose in the morning and at bedtime.      EPINEPHrine 0.3 mg/0.3 mL IJ SOAJ injection Inject 0.3 mg into the muscle once as needed (severe allergic reaction).     fluticasone (FLONASE) 50 MCG/ACT nasal spray Place 2 sprays into both nostrils daily.      levocetirizine (XYZAL) 5 MG tablet Take 5 mg by mouth daily.     montelukast (SINGULAIR) 10 MG tablet Take 10 mg  by mouth daily.      polyethylene glycol (MIRALAX) 17 g packet Take 17 g by mouth 2 (two) times daily. 1 each 0   valACYclovir (VALTREX) 500 MG tablet Take 500 mg daily, for outbreak take 1000 mg daily x 3 days. 90 tablet 1   cyclobenzaprine (FLEXERIL) 10 MG tablet Take 1 tablet (10 mg total) by mouth 3 (three) times daily as needed for muscle spasms. 30 tablet 0   diclofenac (VOLTAREN) 75 MG EC tablet Take 1 tablet (75 mg total) by mouth 2 (two) times daily. 30 tablet 0   No facility-administered medications prior to visit.    Allergies  Allergen Reactions   Claritin [Loratadine] Other (See Comments)    Causes hands to shake Tremors   Other Anaphylaxis    Foods:  Skittles, donuts (all).   Some Makeup.     Diflucan [Fluconazole] Rash    On upper lip    Review of Systems REFER TO HPI FOR PERTINENT POSITIVES AND NEGATIVES     Objective:    Physical Exam Vitals and nursing note reviewed.  Constitutional:      Appearance: Normal appearance. She is obese. She is not toxic-appearing.  HENT:     Head: Normocephalic and atraumatic.     Right Ear: Tympanic membrane, ear canal and external ear normal.     Left Ear: Tympanic membrane, ear canal and external ear normal.     Nose: Nose normal.     Mouth/Throat:     Mouth: Mucous membranes are moist.  Eyes:     Extraocular Movements: Extraocular movements intact.     Conjunctiva/sclera: Conjunctivae normal.     Pupils: Pupils are equal, round, and reactive to light.  Cardiovascular:     Rate and Rhythm: Normal rate and regular rhythm.     Pulses: Normal pulses.     Heart sounds: Normal heart sounds.  Pulmonary:     Effort: Pulmonary effort is normal.     Breath sounds: Normal breath sounds.  Abdominal:     General: Abdomen is flat. Bowel sounds are normal.     Palpations: Abdomen is soft.  Musculoskeletal:        General: Normal range of motion.     Cervical back: Normal range of motion and neck supple.     Right lower leg:  1+ Pitting Edema present.     Left lower leg: 1+ Pitting Edema present.  Skin:    General: Skin is warm and dry.  Neurological:     General: No focal deficit present.     Mental Status:  She is alert and oriented to person, place, and time.  Psychiatric:        Mood and Affect: Mood normal.        Behavior: Behavior normal.        Thought Content: Thought content normal.        Judgment: Judgment normal.    BP 115/70   Pulse 96   Temp (!) 97.4 F (36.3 C)   Ht 5' 5.5" (1.664 m)   Wt 264 lb (119.7 kg)   LMP 06/25/2014 (Approximate)   SpO2 98%   BMI 43.26 kg/m  Wt Readings from Last 3 Encounters:  09/17/20 264 lb (119.7 kg)  04/13/20 255 lb (115.7 kg)  02/27/20 247 lb (112 kg)    Health Maintenance Due  Topic Date Due   MAMMOGRAM  Never done   TETANUS/TDAP  08/08/2019   INFLUENZA VACCINE  09/07/2020    There are no preventive care reminders to display for this patient.   Lab Results  Component Value Date   TSH 1.61 08/15/2019   Lab Results  Component Value Date   WBC 9.5 08/15/2019   HGB 13.5 08/15/2019   HCT 41.0 08/15/2019   MCV 81.1 08/15/2019   PLT 355.0 08/15/2019   Lab Results  Component Value Date   NA 139 12/27/2016   K 3.6 12/27/2016   CO2 26 12/27/2016   GLUCOSE 89 12/27/2016   BUN 10 12/27/2016   CREATININE 0.77 12/27/2016   BILITOT 0.4 08/15/2019   ALKPHOS 36 (L) 08/15/2019   AST 19 08/15/2019   ALT 28 08/15/2019   PROT 7.2 08/15/2019   ALBUMIN 4.3 08/15/2019   CALCIUM 8.1 (L) 12/27/2016   ANIONGAP 7 12/27/2016   Lab Results  Component Value Date   CHOL 171 05/26/2015   Lab Results  Component Value Date   HDL 34 (L) 05/26/2015   Lab Results  Component Value Date   LDLCALC 111 (H) 05/26/2015   Lab Results  Component Value Date   TRIG 131 05/26/2015   Lab Results  Component Value Date   CHOLHDL 5.0 05/26/2015   Lab Results  Component Value Date   HGBA1C 6.2 (H) 05/26/2015       Assessment & Plan:   Problem List  Items Addressed This Visit   None  1. Peripheral edema 2. Morbid obesity (HCC) 3. BMI 40.0-44.9, adult (HCC) Edema most likely 2/2 weight and inactivity level. However, need to ensure no other underlying cause. CMP and TSH to be checked today. U/A in office showed no protein present. Will also check ECHO. Encouraged her to drink more water, limit salt in diet, walk more, and try compression stockings in the daytime. Lasix 20 mg 1/2 to 1 tab daily only as needed for severe edema, to take with a banana. For acutely worsening edema, sob, or chest pain, pt will present to the ED. She is agreeable and understanding.    Reverie Vaquera M Journey [place], PA-C

## 2020-09-17 NOTE — Patient Instructions (Signed)
Good to meet you today! Please go to the lab for blood work and I will send results through MyChart. Continue low salt diet. Compression stockings ok during daytime. Will schedule for ECHOcardiogram of heart as well. You may take lasix 20 mg 1/2 to 1 tab once daily only as needed. Take with a banana.

## 2020-09-18 ENCOUNTER — Other Ambulatory Visit (INDEPENDENT_AMBULATORY_CARE_PROVIDER_SITE_OTHER): Payer: 59

## 2020-09-18 DIAGNOSIS — R609 Edema, unspecified: Secondary | ICD-10-CM | POA: Diagnosis not present

## 2020-09-18 LAB — POCT URINALYSIS DIPSTICK
Bilirubin, UA: NEGATIVE
Blood, UA: NEGATIVE
Glucose, UA: NEGATIVE
Ketones, UA: NEGATIVE
Leukocytes, UA: NEGATIVE
Nitrite, UA: NEGATIVE
Protein, UA: NEGATIVE
Spec Grav, UA: >=1.03 — AB (ref 1.010–1.025)
Urobilinogen, UA: 0.2 E.U./dL
pH, UA: 5.5 (ref 5.0–8.0)

## 2020-09-18 LAB — COMPREHENSIVE METABOLIC PANEL
ALT: 22 U/L (ref 0–35)
AST: 19 U/L (ref 0–37)
Albumin: 4.3 g/dL (ref 3.5–5.2)
Alkaline Phosphatase: 39 U/L (ref 39–117)
BUN: 15 mg/dL (ref 6–23)
CO2: 24 mEq/L (ref 19–32)
Calcium: 8.6 mg/dL (ref 8.4–10.5)
Chloride: 103 mEq/L (ref 96–112)
Creatinine, Ser: 0.77 mg/dL (ref 0.40–1.20)
GFR: 96.68 mL/min (ref >60.00)
Glucose, Bld: 111 mg/dL — ABNORMAL HIGH (ref 70–99)
Potassium: 3.6 mEq/L (ref 3.5–5.1)
Sodium: 139 mEq/L (ref 135–145)
Total Bilirubin: 0.3 mg/dL (ref 0.2–1.2)
Total Protein: 7.3 g/dL (ref 6.0–8.3)

## 2020-09-18 LAB — TSH: TSH: 3.09 u[IU]/mL (ref 0.35–5.50)

## 2020-10-07 ENCOUNTER — Encounter: Payer: Self-pay | Admitting: Physician Assistant

## 2020-10-07 ENCOUNTER — Encounter: Payer: 59 | Admitting: Physician Assistant

## 2020-10-13 ENCOUNTER — Encounter: Payer: Self-pay | Admitting: Physician Assistant

## 2020-11-13 ENCOUNTER — Other Ambulatory Visit: Payer: Self-pay | Admitting: Physician Assistant

## 2020-11-13 ENCOUNTER — Ambulatory Visit
Admission: RE | Admit: 2020-11-13 | Discharge: 2020-11-13 | Disposition: A | Discharge status: Home / Self Care | Payer: 59 | Source: Ambulatory Visit | Attending: Physician Assistant | Admitting: Physician Assistant

## 2020-11-13 ENCOUNTER — Other Ambulatory Visit: Payer: Self-pay

## 2020-11-13 DIAGNOSIS — Z1231 Encounter for screening mammogram for malignant neoplasm of breast: Secondary | ICD-10-CM

## 2020-11-27 ENCOUNTER — Telehealth: Payer: Self-pay

## 2020-11-27 NOTE — Telephone Encounter (Signed)
Returning pt phone call from 11/26/20 No answer/left vm stating I was returning pt phone call

## 2020-11-30 ENCOUNTER — Other Ambulatory Visit (HOSPITAL_COMMUNITY)
Admission: RE | Admit: 2020-11-30 | Discharge: 2020-11-30 | Disposition: A | Discharge status: Home / Self Care | Payer: 59 | Source: Ambulatory Visit | Attending: Obstetrics and Gynecology | Admitting: Obstetrics and Gynecology

## 2020-11-30 ENCOUNTER — Other Ambulatory Visit: Payer: Self-pay

## 2020-11-30 ENCOUNTER — Ambulatory Visit: Payer: 59

## 2020-11-30 VITALS — BP 136/82 | HR 94

## 2020-11-30 DIAGNOSIS — N898 Other specified noninflammatory disorders of vagina: Secondary | ICD-10-CM

## 2020-11-30 NOTE — Progress Notes (Signed)
SUBJECTIVE:  40 y.o. female complains of thick vaginal discharge for 6 day(s). Denies abnormal vaginal bleeding or significant pelvic pain or fever. No UTI symptoms. Denies history of known exposure to STD.  Patient's last menstrual period was 06/25/2014 (approximate). HYST   OBJECTIVE:  She appears well, afebrile. Urine dipstick: not done.  ASSESSMENT:  Vaginal Discharge  Vaginal itching    PLAN:  GC, chlamydia, trichomonas, BVAG, CVAG probe sent to lab. Treatment: To be determined once lab results are received ROV prn if symptoms persist or worsen.

## 2020-11-30 NOTE — Progress Notes (Signed)
Patient was assessed and managed by nursing staff during this encounter. I have reviewed the chart and agree with the documentation and plan. I have also made any necessary editorial changes.  Sallye Lunz, MD 11/30/2020 1:23 PM   

## 2020-12-01 LAB — CERVICOVAGINAL ANCILLARY ONLY
Bacterial Vaginitis (gardnerella): POSITIVE — AB
Candida Glabrata: NEGATIVE
Candida Vaginitis: NEGATIVE
Chlamydia: NEGATIVE
Comment: NEGATIVE
Comment: NEGATIVE
Comment: NEGATIVE
Comment: NEGATIVE
Comment: NEGATIVE
Comment: NORMAL
Neisseria Gonorrhea: NEGATIVE
Trichomonas: NEGATIVE

## 2020-12-02 MED ORDER — METRONIDAZOLE 500 MG PO TABS: 500.0000 mg | ORAL_TABLET | Freq: Two times a day (BID) | ORAL | 0 refills | Status: AC

## 2020-12-02 NOTE — Addendum Note (Signed)
Addended by: [place] Bing on: 12/02/2020 09:03 AM   Modules accepted: Orders

## 2020-12-11 ENCOUNTER — Ambulatory Visit (HOSPITAL_COMMUNITY): Payer: 59 | Attending: Internal Medicine

## 2020-12-11 ENCOUNTER — Other Ambulatory Visit: Payer: Self-pay

## 2020-12-11 DIAGNOSIS — Z6841 Body Mass Index (BMI) 40.0 and over, adult: Secondary | ICD-10-CM | POA: Diagnosis not present

## 2020-12-11 DIAGNOSIS — R609 Edema, unspecified: Secondary | ICD-10-CM

## 2020-12-11 LAB — ECHOCARDIOGRAM COMPLETE
Area-P 1/2: 3.68 cm2
S' Lateral: 2.4 cm

## 2021-07-01 ENCOUNTER — Ambulatory Visit (INDEPENDENT_AMBULATORY_CARE_PROVIDER_SITE_OTHER): Payer: 59 | Admitting: Family

## 2021-07-01 ENCOUNTER — Encounter: Payer: Self-pay | Admitting: Family

## 2021-07-01 VITALS — BP 123/85 | HR 93 | Temp 97.9°F | Ht 65.0 in | Wt 257.5 lb

## 2021-07-01 DIAGNOSIS — J3089 Other allergic rhinitis: Secondary | ICD-10-CM

## 2021-07-01 MED ORDER — MONTELUKAST SODIUM 10 MG PO TABS: 10.0000 mg | ORAL_TABLET | Freq: Every day | ORAL | 5 refills | Status: AC

## 2021-07-01 MED ORDER — FUROSEMIDE 20 MG PO TABS: 20.0000 mg | ORAL_TABLET | Freq: Every day | ORAL | 0 refills | Status: DC

## 2021-07-01 MED ORDER — AZELASTINE HCL 137 MCG/SPRAY NA SOLN: 1.0000 | Freq: Every evening | NASAL | 5 refills | Status: AC

## 2021-07-01 MED ORDER — FLUTICASONE PROPIONATE 50 MCG/ACT NA SUSP: NASAL | 5 refills | Status: AC

## 2021-07-01 NOTE — Progress Notes (Signed)
Subjective:     Patient ID: Dawn [name], female    DOB: Jun 06, 1980, 41 y.o.   MRN: [id number]  Chief Complaint  Patient presents with   Sinus Problem    Pt c/o pressure/facial pain. Has tried allegra D w/ mucinex and motrin, Helps a little but comes right back. Present since 5/22   HPI: Sinus/Allergy Sx: Patient complains of headache described as frontal, nasal congestion, post nasal drip, purulent nasal discharge, and sinus pressure, with no fever, chills, night sweats or weight loss. Onset of acute symptoms was 3 days ago, unchanged since that time. She is drinking moderate amounts of fluids.  Past history is significant for chronic sinusitis and year round allergies, but ran out of her allergy meds. Patient is non-smoker.   Assessment & Plan:   Problem List Items Addressed This Visit       Respiratory   Seasonal and perennial allergic rhinitis - Primary pt reports running out of all of her allergy meds a short time ago, current sx started 3 days ago. Advised on getting back on meds as directed. Provided Norel AD samples for 4d, 1 pill bid. If sx are not resolving, can call back next week.    Relevant Medications   fluticasone (FLONASE) 50 MCG/ACT nasal spray   Azelastine HCl 137 MCG/SPRAY SOLN   montelukast (SINGULAIR) 10 MG tablet    Outpatient Medications Prior to Visit  Medication Sig Dispense Refill   EPINEPHrine 0.3 mg/0.3 mL IJ SOAJ injection Inject 0.3 mg into the muscle once as needed (severe allergic reaction).     valACYclovir (VALTREX) 500 MG tablet Take 500 mg daily, for outbreak take 1000 mg daily x 3 days. 90 tablet 1   Azelastine HCl 137 MCG/SPRAY SOLN Place 1 spray into the nose in the morning and at bedtime.      fluticasone (FLONASE) 50 MCG/ACT nasal spray Place 2 sprays into both nostrils daily.      levocetirizine (XYZAL) 5 MG tablet Take 5 mg by mouth daily.     montelukast (SINGULAIR) 10 MG tablet Take 10 mg by mouth daily.      polyethylene glycol  (MIRALAX) 17 g packet Take 17 g by mouth 2 (two) times daily. 1 each 0   furosemide (LASIX) 20 MG tablet Take 1 tablet (20 mg total) by mouth daily. Take on as needed basis for edema. 30 tablet 0   No facility-administered medications prior to visit.    Past Medical History:  Diagnosis Date   Allergy    Autoimmune retinopathy (HCC)    Carpal tunnel syndrome of left wrist    Chorioretinitis and choroiditis, disseminated, generalized    Dysmenorrhea    Family history of rheumatoid arthritis    PONV (postoperative nausea and vomiting)    Vaginal delivery 2004   Vitamin D deficiency     Past Surgical History:  Procedure Laterality Date   APPENDECTOMY N/A 12/13/2016   Procedure: POSSIBLE APPENDECTOMY;  Surgeon: Griselda Mineroth, Paul III, MD;  [facility];  Service: General;  Laterality: N/A;   BREAST REDUCTION SURGERY Bilateral 08/28/2014   Procedure: BILATERAL BREAST  REDUCTION  ;  Surgeon: Etter Sjogrenavid Bowers, MD;  [facility];  Service: Plastics;  Laterality: Bilateral;   CARPAL TUNNEL RELEASE Left 2019   LAPAROSCOPY N/A 02/18/2013   Procedure: LAPAROSCOPY OPERATIVE;  Surgeon: Levi AlandMark E Anderson, MD;  Location: [facility];  Service: Gynecology;  Laterality: N/A;  YAG LASER   LAPAROTOMY N/A 12/13/2016   Procedure: DIAGNOSTIC LAPAROTOMY;  Surgeon: Griselda Miner, MD;  Location: [facility] OR;  Service: General;  Laterality: N/A;   REDUCTION MAMMAPLASTY Bilateral 2016   TONSILLECTOMY  2008   VAGINAL HYSTERECTOMY  06-26-14   YAG LASER APPLICATION N/A 02/18/2013   Procedure: YAG LASER APPLICATION;  Surgeon: Levi Aland, MD;  Location: [facility];  Service: Gynecology;  Laterality: N/A;    Allergies  Allergen Reactions   Claritin [Loratadine] Other (See Comments)    Causes hands to shake Tremors   Other Anaphylaxis    Foods:  Skittles, donuts (all).   Some Makeup.     Diflucan [Fluconazole] Rash    On upper lip       Objective:    Physical Exam Vitals and nursing note reviewed.  Constitutional:       Appearance: Normal appearance. She is obese.  HENT:     Right Ear: Tympanic membrane and ear canal normal.     Left Ear: Tympanic membrane and ear canal normal.     Nose:     Right Sinus: Frontal sinus tenderness (pressure vs pain) present.     Left Sinus: Left frontal sinus tenderness: pressure vs pain.     Mouth/Throat:     Mouth: Mucous membranes are moist.     Pharynx: No pharyngeal swelling, oropharyngeal exudate, posterior oropharyngeal erythema or uvula swelling.     Tonsils: No tonsillar exudate or tonsillar abscesses.  Cardiovascular:     Rate and Rhythm: Normal rate and regular rhythm.  Pulmonary:     Effort: Pulmonary effort is normal.     Breath sounds: Normal breath sounds.  Musculoskeletal:        General: Normal range of motion.  Lymphadenopathy:     Head:     Right side of head: No preauricular or posterior auricular adenopathy.     Left side of head: No preauricular or posterior auricular adenopathy.     Cervical: No cervical adenopathy.  Skin:    General: Skin is warm and dry.  Neurological:     Mental Status: She is alert.  Psychiatric:        Mood and Affect: Mood normal.        Behavior: Behavior normal.    BP 123/85 (BP Location: Left Arm, Patient Position: Sitting, Cuff Size: Large)   Pulse 93   Temp 97.9 F (36.6 C) (Temporal)   Ht 5\' 5"  (1.651 m)   Wt 257 lb 8 oz (116.8 kg)   LMP 06/25/2014 (Approximate)   SpO2 97%   BMI 42.85 kg/m  Wt Readings from Last 3 Encounters:  07/01/21 257 lb 8 oz (116.8 kg)  09/17/20 264 lb (119.7 kg)  04/13/20 255 lb (115.7 kg)        Meds ordered this encounter  Medications   fluticasone (FLONASE) 50 MCG/ACT nasal spray    Sig: 1 spray each nostril twice a day for 2 weeks, then reduce to 1 spray each nostril daily.    Dispense:  16 g    Refill:  5    Order Specific Question:   Supervising Provider    Answer:   ANDY, CAMILLE L [[id number]]   Azelastine HCl 137 MCG/SPRAY SOLN    Sig: Place 1 spray into both  nostrils at bedtime.    Dispense:  30 mL    Refill:  5    Order Specific Question:   Supervising Provider    Answer:   ANDY, CAMILLE L [[id number]]   montelukast (SINGULAIR) 10 MG tablet  Sig: Take 1 tablet (10 mg total) by mouth daily.    Dispense:  30 tablet    Refill:  5    Order Specific Question:   Supervising Provider    Answer:   ANDY, CAMILLE L [[id number]]   furosemide (LASIX) 20 MG tablet    Sig: Take 1 tablet (20 mg total) by mouth daily. Take on as needed basis for edema.    Dispense:  30 tablet    Refill:  0    Order Specific Question:   Supervising Provider    Answer:   ANDY, CAMILLE L [[id number]]    Dulce Sellar, NP

## 2021-07-01 NOTE — Patient Instructions (Addendum)
It was very nice to see you today!  I have given you samples of Norel AD for your sinus symptoms. Take 1 pill twice a day for the next 4 days. OK to continue to use the Xyzal, Singulair, Azelastine and Flonase nasal sprays and I have sent refills.  Drink at least 2 liters of water daily.  Hope you feel better soon!   PLEASE NOTE:  If you had any lab tests please let us know if you have not heard back within a few days. You may see your results on MyChart before we have a chance to review them but we will give you a call once they are reviewed by [place]. If we ordered any referrals today, please let us know if you have not heard from their office within the next week.

## 2021-07-09 ENCOUNTER — Telehealth: Payer: Self-pay | Admitting: Physician Assistant

## 2021-07-09 MED ORDER — NOREL AD 4-10-325 MG PO TABS: 1.0000 | ORAL_TABLET | Freq: Every day | ORAL | 0 refills | Status: AC | PRN

## 2021-07-09 NOTE — Telephone Encounter (Signed)
Patient states at last ov with [facility] she was given samples of norelad.  She would like to know if a script for this medication can be sent to [facility] on [address].

## 2021-07-09 NOTE — Telephone Encounter (Signed)
Left detailed message Rx was sent to pharmacy. Any questions please call office.

## 2021-07-09 NOTE — Telephone Encounter (Signed)
Please see message and advise 

## 2021-08-18 ENCOUNTER — Other Ambulatory Visit: Payer: Self-pay | Admitting: Family Medicine

## 2021-09-22 ENCOUNTER — Ambulatory Visit (INDEPENDENT_AMBULATORY_CARE_PROVIDER_SITE_OTHER): Payer: 59 | Admitting: Physician Assistant

## 2021-09-22 ENCOUNTER — Encounter: Payer: Self-pay | Admitting: Physician Assistant

## 2021-09-22 VITALS — BP 120/76 | HR 94 | Temp 97.6°F | Ht 65.0 in | Wt 265.8 lb

## 2021-09-22 DIAGNOSIS — E669 Obesity, unspecified: Secondary | ICD-10-CM

## 2021-09-22 DIAGNOSIS — R6889 Other general symptoms and signs: Secondary | ICD-10-CM | POA: Diagnosis not present

## 2021-09-22 NOTE — Patient Instructions (Signed)
It was great to see you!  We are going to try to send in [facility] for you to take for weight loss -- this will require a prior authorization. If it does not go through, we will order phentermine.  Let's update blood work today to figure out if something is contributing to your feelings of cold.  Let's plan for a virtual visit in 3 months!  Take care,  Jarold Motto PA-C

## 2021-09-22 NOTE — Progress Notes (Signed)
Dawn [name] is a 41 y.o. female here for a follow up of a pre-existing problem.  History of Present Illness:   Chief Complaint  Patient presents with   discuss weight loss    HPI  Obesity  Tried phentermine in the past -- a few years ago. She did well with this.  She is wondering if she could restart this.  She is interested in other medication options if appropriate.  She is exercising and eating healthy.  She is a Best boy.   Temperature intolerance After she eats, she feels cold. Typically after starchy foods.  This is typically at lunch and only at work.  Feels like she has to get a sweater after she eats a starchy meal.  Denies abnormal urination or sweating.  Does not have periods anymore has had a hysterectomy.    Past Medical History:  Diagnosis Date   Allergy    Autoimmune retinopathy (HCC)    Carpal tunnel syndrome of left wrist    Chorioretinitis and choroiditis, disseminated, generalized    Dysmenorrhea    Family history of rheumatoid arthritis    PONV (postoperative nausea and vomiting)    Vaginal delivery 2004   Vitamin D deficiency      Social History   Tobacco Use   Smoking status: Never   Smokeless tobacco: Never  Vaping Use   Vaping Use: Never used  Substance Use Topics   Alcohol use: Yes    Comment: 1 drink per month   Drug use: No    Past Surgical History:  Procedure Laterality Date   APPENDECTOMY N/A 12/13/2016   Procedure: POSSIBLE APPENDECTOMY;  Surgeon: Griselda Miner, MD;  Location: [facility] OR;  Service: General;  Laterality: N/A;   BREAST REDUCTION SURGERY Bilateral 08/28/2014   Procedure: BILATERAL BREAST  REDUCTION  ;  Surgeon: Etter Sjogren, MD;  Location: [facility] OR;  Service: Plastics;  Laterality: Bilateral;   CARPAL TUNNEL RELEASE Left 2019   LAPAROSCOPY N/A 02/18/2013   Procedure: LAPAROSCOPY OPERATIVE;  Surgeon: Levi Aland, MD;  Location: [facility];  Service: Gynecology;  Laterality: N/A;  YAG LASER   LAPAROTOMY N/A  12/13/2016   Procedure: DIAGNOSTIC LAPAROTOMY;  Surgeon: Griselda Miner, MD;  Location: [facility] OR;  Service: General;  Laterality: N/A;   REDUCTION MAMMAPLASTY Bilateral 2016   TONSILLECTOMY  2008   VAGINAL HYSTERECTOMY  06-26-14   YAG LASER APPLICATION N/A 02/18/2013   Procedure: YAG LASER APPLICATION;  Surgeon: Levi Aland, MD;  Location: [facility];  Service: Gynecology;  Laterality: N/A;    Family History  Problem Relation Age of Onset   Hyperlipidemia Mother    Hypertension Mother    Allergic rhinitis Mother    Diabetes Mother    Kidney disease Father    Asthma Sister    Allergic rhinitis Sister    Allergic rhinitis Brother    Asthma Paternal Uncle    Prostate cancer Paternal Uncle    Canavan disease Paternal Grandfather        unknown type   Cancer Paternal Grandfather    Colon cancer Neg Hx    Esophageal cancer Neg Hx    Pancreatic cancer Neg Hx    Stomach cancer Neg Hx    Liver disease Neg Hx     Allergies  Allergen Reactions   Claritin [Loratadine] Other (See Comments)    Causes hands to shake Tremors   Other Anaphylaxis    Foods:  Skittles, donuts (all).  Some Makeup.     Diflucan [Fluconazole] Rash    On upper lip    Current Medications:   Current Outpatient Medications:    Azelastine HCl 137 MCG/SPRAY SOLN, Place 1 spray into both nostrils at bedtime., Disp: 30 mL, Rfl: 5   cephALEXin (KEFLEX) 500 MG capsule, TK 1 C PO BID, Disp: , Rfl:    cetirizine (ZYRTEC) 10 MG tablet, TK 1 T PO D, Disp: , Rfl:    Chlorphen-PE-Acetaminophen (NOREL AD) 4-10-325 MG TABS, Take 1 tablet by mouth daily as needed., Disp: 84 tablet, Rfl: 0   EPINEPHrine 0.3 mg/0.3 mL IJ SOAJ injection, Inject 0.3 mg into the muscle once as needed (severe allergic reaction)., Disp: , Rfl:    fluticasone (FLONASE) 50 MCG/ACT nasal spray, 1 spray each nostril twice a day for 2 weeks, then reduce to 1 spray each nostril daily., Disp: 16 g, Rfl: 5   montelukast (SINGULAIR) 10 MG tablet, Take 1 tablet  (10 mg total) by mouth daily., Disp: 30 tablet, Rfl: 5   valACYclovir (VALTREX) 500 MG tablet, TAKE 1 TABLET BY MOUTH ONCE DAILY.   FOR  OUTBREAK,  TAKE  2  TABLETS  DAILY  FOR  3  DAYS, Disp: 90 tablet, Rfl: 0   Review of Systems:   ROS Negative unless otherwise specified per HPI.  Vitals:   Vitals:   09/22/21 1420  BP: 120/76  Pulse: 94  Temp: 97.6 F (36.4 C)  SpO2: 97%  Weight: 265 lb 12.8 oz (120.6 kg)  Height: 5\' 5"  (1.651 m)     Body mass index is 44.23 kg/m.  Physical Exam:   Physical Exam Vitals and nursing note reviewed.  Constitutional:      General: She is not in acute distress.    Appearance: She is well-developed. She is not ill-appearing or toxic-appearing.  Cardiovascular:     Rate and Rhythm: Normal rate and regular rhythm.     Pulses: Normal pulses.     Heart sounds: Normal heart sounds, S1 normal and S2 normal.  Pulmonary:     Effort: Pulmonary effort is normal.     Breath sounds: Normal breath sounds.  Skin:    General: Skin is warm and dry.  Neurological:     Mental Status: She is alert.     GCS: GCS eye subscore is 4. GCS verbal subscore is 5. GCS motor subscore is 6.  Psychiatric:        Speech: Speech normal.        Behavior: Behavior normal. Behavior is cooperative.     Assessment and Plan:   Obesity, unspecified classification, unspecified obesity type, unspecified whether serious comorbidity present Uncontrolled She is a good candidate for GLP-1 Will trial Wegovy 0.25 mg weekly injection If unable to obtain this will send in phentermine for her Follow-up in 3 months, sooner if concerns  Sensation of feeling cold Unclear etiology No red flags  We will update further evaluation to check for iron deficiency or thyroid abnormality Possibly just an issue with the temperature at her workplace but we will continue to monitor    , PA-C

## 2021-09-23 ENCOUNTER — Other Ambulatory Visit (INDEPENDENT_AMBULATORY_CARE_PROVIDER_SITE_OTHER): Payer: 59

## 2021-09-23 DIAGNOSIS — R6889 Other general symptoms and signs: Secondary | ICD-10-CM | POA: Diagnosis not present

## 2021-09-23 LAB — IBC + FERRITIN
Ferritin: 110.9 ng/mL (ref 10.0–291.0)
Iron: 88 ug/dL (ref 42–145)
Saturation Ratios: 27.8 % (ref 20.0–50.0)
TIBC: 316.4 ug/dL (ref 250.0–450.0)
Transferrin: 226 mg/dL (ref 212.0–360.0)

## 2021-09-23 LAB — COMPREHENSIVE METABOLIC PANEL
ALT: 15 U/L (ref 0–35)
AST: 14 U/L (ref 0–37)
Albumin: 4 g/dL (ref 3.5–5.2)
Alkaline Phosphatase: 41 U/L (ref 39–117)
BUN: 13 mg/dL (ref 6–23)
CO2: 28 mEq/L (ref 19–32)
Calcium: 8.7 mg/dL (ref 8.4–10.5)
Chloride: 105 mEq/L (ref 96–112)
Creatinine, Ser: 0.83 mg/dL (ref 0.40–1.20)
GFR: 87.73 mL/min (ref >60.00)
Glucose, Bld: 136 mg/dL — ABNORMAL HIGH (ref 70–99)
Potassium: 3.9 mEq/L (ref 3.5–5.1)
Sodium: 139 mEq/L (ref 135–145)
Total Bilirubin: 0.4 mg/dL (ref 0.2–1.2)
Total Protein: 6.5 g/dL (ref 6.0–8.3)

## 2021-09-23 LAB — CBC WITH DIFFERENTIAL/PLATELET
Basophils Absolute: 0 10*3/uL (ref 0.0–0.1)
Basophils Relative: 0.5 % (ref 0.0–3.0)
Eosinophils Absolute: 0.1 10*3/uL (ref 0.0–0.7)
Eosinophils Relative: 1.5 % (ref 0.0–5.0)
HCT: 39.5 % (ref 36.0–46.0)
Hemoglobin: 12.6 g/dL (ref 12.0–15.0)
Lymphocytes Relative: 29.3 % (ref 12.0–46.0)
Lymphs Abs: 2 10*3/uL (ref 0.7–4.0)
MCHC: 31.9 g/dL (ref 30.0–36.0)
MCV: 80.6 fl (ref 78.0–100.0)
Monocytes Absolute: 0.3 10*3/uL (ref 0.1–1.0)
Monocytes Relative: 4.2 % (ref 3.0–12.0)
Neutro Abs: 4.4 10*3/uL (ref 1.4–7.7)
Neutrophils Relative %: 64.5 % (ref 43.0–77.0)
Platelets: 312 10*3/uL (ref 150.0–400.0)
RBC: 4.9 Mil/uL (ref 3.87–5.11)
RDW: 14.4 % (ref 11.5–15.5)
WBC: 6.8 10*3/uL (ref 4.0–10.5)

## 2021-09-23 LAB — TSH: TSH: 1.24 u[IU]/mL (ref 0.35–5.50)

## 2021-10-03 ENCOUNTER — Encounter: Payer: Self-pay | Admitting: Physician Assistant

## 2021-10-04 MED ORDER — WEGOVY 0.25 MG/0.5ML ~~LOC~~ SOAJ: 0.2500 mg | SUBCUTANEOUS | 0 refills | Status: DC

## 2021-10-05 NOTE — Telephone Encounter (Signed)
Patient is requesting script to be redirected to [facility] listed in chart.    Please give a call to notify patient once sent at [phone].

## 2021-10-06 NOTE — Telephone Encounter (Signed)
Left message on voicemail to call office.  

## 2021-10-07 MED ORDER — WEGOVY 0.25 MG/0.5ML ~~LOC~~ SOAJ: 0.2500 mg | SUBCUTANEOUS | 0 refills | Status: AC

## 2021-10-07 NOTE — Telephone Encounter (Signed)
Pt called back, said she will be in [place] for 2 months for her job. Told her I am not sure what will happen if I send Rx for Wegovy to [facility] and they may not have it in stock due to nationwide shortage. Also if it needs a prior authorization once it is done in [place] when you come back to [place] it will need a PA again. Told her I will send Rx to [facility] and see what happens. Pt verbalized understanding. Rx sent

## 2021-10-07 NOTE — Telephone Encounter (Signed)
Left message on voicemail calling about Rx you want sent to [facility]. I can not do that cause if it needs a PA I can not do out of state. Please call the office.

## 2021-10-07 NOTE — Addendum Note (Signed)
Addended by: Jimmye Norman on: 10/07/2021 12:35 PM   Modules accepted: Orders

## 2021-10-19 NOTE — Telephone Encounter (Signed)
Received fax from [facility] [employer]. Wegovy 0.25 mg has been denied due to drug not covered by plan.

## 2021-11-01 ENCOUNTER — Encounter: Payer: Self-pay | Admitting: Physician Assistant

## 2021-11-01 ENCOUNTER — Encounter: Payer: Self-pay | Admitting: *Deleted

## 2021-11-08 DIAGNOSIS — M542 Cervicalgia: Secondary | ICD-10-CM | POA: Diagnosis not present

## 2021-11-08 DIAGNOSIS — R0789 Other chest pain: Secondary | ICD-10-CM | POA: Diagnosis not present

## 2021-11-09 DIAGNOSIS — M25512 Pain in left shoulder: Secondary | ICD-10-CM | POA: Diagnosis not present

## 2021-11-09 DIAGNOSIS — R0789 Other chest pain: Secondary | ICD-10-CM | POA: Diagnosis not present

## 2021-11-09 DIAGNOSIS — M542 Cervicalgia: Secondary | ICD-10-CM | POA: Diagnosis not present

## 2021-11-11 ENCOUNTER — Ambulatory Visit (INDEPENDENT_AMBULATORY_CARE_PROVIDER_SITE_OTHER): Payer: 59 | Admitting: *Deleted

## 2021-11-11 DIAGNOSIS — Z23 Encounter for immunization: Secondary | ICD-10-CM | POA: Diagnosis not present

## 2021-11-24 DIAGNOSIS — M25561 Pain in right knee: Secondary | ICD-10-CM | POA: Diagnosis not present

## 2021-12-23 ENCOUNTER — Telehealth (INDEPENDENT_AMBULATORY_CARE_PROVIDER_SITE_OTHER): Payer: 59 | Admitting: Physician Assistant

## 2021-12-23 DIAGNOSIS — E669 Obesity, unspecified: Secondary | ICD-10-CM

## 2021-12-23 MED ORDER — PHENTERMINE HCL 37.5 MG PO CAPS: 37.5000 mg | ORAL_CAPSULE | ORAL | 2 refills | Status: AC

## 2021-12-23 NOTE — Progress Notes (Signed)
Virtual Visit via Video Note   I, Jarold Motto, PA, connected with  Dawn [name]  ([id number], [birth year]) on 12/23/21 at  8:20 AM EST by a video-enabled telemedicine application and verified that I am speaking with the correct person using two identifiers.  Location: Patient: Home Provider: [facility] office   I discussed the limitations of evaluation and management by telemedicine and the availability of in person appointments. The patient expressed understanding and agreed to proceed.    History of Present Illness: Dawn [name] is a 41 y.o. who identifies as a female who was assigned female at birth, and is being seen today for Obesity.   She was prescribed Wegovy however this was not approved by her insurance. She has taken phentermine in the past and would like to trial this. 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   Status post laparoscopic appendectomy 12/26/2016   Abdominal pain 12/12/2016   Well woman exam 03/15/2016   Possible exposure to STD 03/15/2016   Allergic reactions 01/05/2016   Seasonal and perennial allergic rhinitis 01/05/2016   Nasal polyps 01/05/2016   Essential (primary) hypertension 08/25/2015   Stroke-like symptoms 05/25/2015   Cephalalgia    Essential hypertension    Breast hypertrophy in female 08/28/2014   Obesity 04/03/2014   Back pain 04/03/2014   Chronic sinusitis 04/03/2014   Endometriosis determined by laparoscopy 04/03/2014   Recurrent headache 11/08/2012   Genital herpes 10/25/2011   Avitaminosis D 09/05/2011   Acid reflux 08/05/2011   Adult BMI 30+ 08/05/2011   Gastroesophageal reflux disease 08/05/2011    Allergies:  Allergies  Allergen Reactions   Claritin [Loratadine] Other (See Comments)    Causes hands to  shake Tremors   Other Anaphylaxis    Foods:  Skittles, donuts (all).   Some Makeup.     Diflucan [Fluconazole] Rash    On upper lip   Medications:  Current Outpatient Medications:    phentermine 37.5 MG capsule, Take 1 capsule (37.5 mg total) by mouth every morning., Disp: 30 capsule, Rfl: 2   Azelastine HCl 137 MCG/SPRAY SOLN, Place 1 spray into both nostrils at bedtime., Disp: 30 mL, Rfl: 5   cephALEXin (KEFLEX) 500 MG capsule, TK 1 C PO BID, Disp: , Rfl:    cetirizine (ZYRTEC) 10 MG tablet, TK 1 T PO D, Disp: , Rfl:    Chlorphen-PE-Acetaminophen (NOREL AD) 4-10-325 MG TABS, Take 1 tablet by mouth daily as needed., Disp: 84 tablet, Rfl: 0   EPINEPHrine 0.3 mg/0.3 mL IJ SOAJ injection, Inject 0.3 mg into the muscle once as needed (severe allergic reaction)., Disp: , Rfl:    fluticasone (FLONASE) 50 MCG/ACT nasal spray, 1 spray each nostril twice a day for 2 weeks, then reduce to 1 spray each nostril daily., Disp: 16 g, Rfl: 5   montelukast (SINGULAIR) 10 MG tablet, Take 1 tablet (10 mg total) by mouth daily., Disp: 30 tablet, Rfl: 5   Semaglutide-Weight Management (WEGOVY) 0.25 MG/0.5ML SOAJ, Inject 0.25 mg into the skin once a week., Disp: 2 mL, Rfl: 0   valACYclovir (VALTREX) 500 MG tablet, TAKE 1 TABLET BY MOUTH ONCE DAILY.   FOR  OUTBREAK,  TAKE  2  TABLETS  DAILY  FOR  3  DAYS, Disp: 90 tablet, Rfl: 0  Observations/Objective: Patient is well-developed, well-nourished in no acute distress.  Resting comfortably  at home.  Head is normocephalic, atraumatic.  No labored breathing.  Speech is clear and coherent with logical content.  Patient is alert and oriented at baseline.   Assessment and Plan: 1. Obesity, unspecified classification, unspecified obesity type, unspecified whether serious comorbidity present Ongoing Trial phentermine 37.5 mg daily -- side effects, risks discussed Recommend follow-up in 3 months to discuss trialing GLP-1  Follow Up Instructions: I discussed the  assessment and treatment plan with the patient. The patient was provided an opportunity to ask questions and all were answered. The patient agreed with the plan and demonstrated an understanding of the instructions.  A copy of instructions were sent to the patient via MyChart unless otherwise noted below.   The patient was advised to call back or seek an in-person evaluation if the symptoms worsen or if the condition fails to improve as anticipated.  Inda Coke, [place]

## 2022-01-14 DIAGNOSIS — M25561 Pain in right knee: Secondary | ICD-10-CM | POA: Diagnosis not present

## 2022-01-14 DIAGNOSIS — M25572 Pain in left ankle and joints of left foot: Secondary | ICD-10-CM | POA: Diagnosis not present
